# Patient Record
Sex: Female | Born: 1990 | Hispanic: No | Marital: Married | Smoking: Never smoker
Health system: Southern US, Community
[De-identification: ages and names within clinical notes are randomized; demographics above are authoritative.]

## PROBLEM LIST (undated history)

## (undated) ENCOUNTER — Inpatient Hospital Stay (HOSPITAL_COMMUNITY): Payer: Self-pay

## (undated) DIAGNOSIS — O99119 Other diseases of the blood and blood-forming organs and certain disorders involving the immune mechanism complicating pregnancy, unspecified trimester: Secondary | ICD-10-CM

## (undated) DIAGNOSIS — N920 Excessive and frequent menstruation with regular cycle: Secondary | ICD-10-CM

## (undated) DIAGNOSIS — B029 Zoster without complications: Secondary | ICD-10-CM

## (undated) DIAGNOSIS — R031 Nonspecific low blood-pressure reading: Secondary | ICD-10-CM

## (undated) DIAGNOSIS — B20 Human immunodeficiency virus [HIV] disease: Secondary | ICD-10-CM

## (undated) DIAGNOSIS — D696 Thrombocytopenia, unspecified: Secondary | ICD-10-CM

## (undated) HISTORY — DX: Zoster without complications: B02.9

## (undated) HISTORY — DX: Thrombocytopenia, unspecified: D69.6

## (undated) HISTORY — DX: Human immunodeficiency virus (HIV) disease: B20

## (undated) HISTORY — DX: Excessive and frequent menstruation with regular cycle: N92.0

## (undated) HISTORY — DX: Nonspecific low blood-pressure reading: R03.1

## (undated) HISTORY — DX: Thrombocytopenia, unspecified: O99.119

## (undated) HISTORY — PX: NO PAST SURGERIES: SHX2092

---

## 2015-09-30 DIAGNOSIS — B20 Human immunodeficiency virus [HIV] disease: Secondary | ICD-10-CM | POA: Insufficient documentation

## 2015-09-30 DIAGNOSIS — O0990 Supervision of high risk pregnancy, unspecified, unspecified trimester: Secondary | ICD-10-CM | POA: Insufficient documentation

## 2015-09-30 HISTORY — DX: Human immunodeficiency virus (HIV) disease: B20

## 2015-10-25 DIAGNOSIS — R760 Raised antibody titer: Secondary | ICD-10-CM | POA: Insufficient documentation

## 2015-10-25 HISTORY — DX: Raised antibody titer: R76.0

## 2015-12-14 LAB — OB RESULTS CONSOLE PLATELET COUNT: Platelets: 125 10*3/uL

## 2015-12-14 LAB — OB RESULTS CONSOLE RUBELLA ANTIBODY, IGM: Rubella: IMMUNE

## 2015-12-14 LAB — OB RESULTS CONSOLE GC/CHLAMYDIA
CHLAMYDIA, DNA PROBE: NEGATIVE
GC PROBE AMP, GENITAL: NEGATIVE

## 2015-12-14 LAB — OB RESULTS CONSOLE HGB/HCT, BLOOD
HEMATOCRIT: 35 %
HEMOGLOBIN: 12.3 g/dL

## 2015-12-14 LAB — OB RESULTS CONSOLE RPR: RPR: NONREACTIVE

## 2015-12-14 LAB — OB RESULTS CONSOLE HEPATITIS B SURFACE ANTIGEN: Hepatitis B Surface Ag: NEGATIVE

## 2015-12-14 LAB — OB RESULTS CONSOLE ABO/RH: RH TYPE: POSITIVE

## 2015-12-14 LAB — OB RESULTS CONSOLE ANTIBODY SCREEN: Antibody Screen: NEGATIVE

## 2015-12-14 LAB — OB RESULTS CONSOLE HIV ANTIBODY (ROUTINE TESTING): HIV: REACTIVE

## 2015-12-14 LAB — OB RESULTS CONSOLE VARICELLA ZOSTER ANTIBODY, IGG: Varicella: IMMUNE

## 2015-12-16 DIAGNOSIS — Z77011 Contact with and (suspected) exposure to lead: Secondary | ICD-10-CM | POA: Insufficient documentation

## 2016-01-02 DIAGNOSIS — R031 Nonspecific low blood-pressure reading: Secondary | ICD-10-CM

## 2016-01-02 HISTORY — DX: Nonspecific low blood-pressure reading: R03.1

## 2016-02-06 ENCOUNTER — Encounter (HOSPITAL_COMMUNITY): Payer: Self-pay | Admitting: Physician Assistant

## 2016-02-13 ENCOUNTER — Ambulatory Visit: Payer: Self-pay | Admitting: Internal Medicine

## 2016-03-21 LAB — RPR
HEPATITIS B SURFACE ANTIGEN: NEGATIVE
Hepatitis C Ab: NEGATIVE
RPR: NEGATIVE

## 2016-03-21 LAB — GLUCOSE, 1 HOUR: Glucose 1 Hr Prenatal, POC: 108 mg/dL

## 2016-03-22 ENCOUNTER — Telehealth: Payer: Self-pay | Admitting: *Deleted

## 2016-03-22 ENCOUNTER — Ambulatory Visit (INDEPENDENT_AMBULATORY_CARE_PROVIDER_SITE_OTHER): Payer: Medicaid Other | Admitting: Internal Medicine

## 2016-03-22 ENCOUNTER — Ambulatory Visit: Payer: Self-pay | Admitting: Internal Medicine

## 2016-03-22 ENCOUNTER — Encounter: Payer: Self-pay | Admitting: Internal Medicine

## 2016-03-22 VITALS — BP 114/76 | HR 94 | Temp 98.1°F | Ht 63.0 in | Wt 141.0 lb

## 2016-03-22 DIAGNOSIS — B20 Human immunodeficiency virus [HIV] disease: Secondary | ICD-10-CM | POA: Diagnosis present

## 2016-03-22 DIAGNOSIS — Z23 Encounter for immunization: Secondary | ICD-10-CM | POA: Diagnosis not present

## 2016-03-22 DIAGNOSIS — R031 Nonspecific low blood-pressure reading: Secondary | ICD-10-CM | POA: Diagnosis not present

## 2016-03-22 LAB — COMPLETE METABOLIC PANEL WITH GFR
ALBUMIN: 3.1 g/dL — AB (ref 3.6–5.1)
ALK PHOS: 51 U/L (ref 33–115)
ALT: 14 U/L (ref 6–29)
AST: 19 U/L (ref 10–30)
BILIRUBIN TOTAL: 1 mg/dL (ref 0.2–1.2)
BUN: 6 mg/dL — ABNORMAL LOW (ref 7–25)
CO2: 23 mmol/L (ref 20–31)
CREATININE: 0.38 mg/dL — AB (ref 0.50–1.10)
Calcium: 8.6 mg/dL (ref 8.6–10.2)
Chloride: 105 mmol/L (ref 98–110)
GFR, Est Non African American: >89 mL/min (ref >=60)
GLUCOSE: 89 mg/dL (ref 65–99)
Potassium: 3.9 mmol/L (ref 3.5–5.3)
Sodium: 139 mmol/L (ref 135–146)
TOTAL PROTEIN: 6.2 g/dL (ref 6.1–8.1)

## 2016-03-22 LAB — CBC WITH DIFFERENTIAL/PLATELET
BASOS ABS: 0 {cells}/uL (ref 0–200)
BASOS PCT: 0 %
EOS ABS: 81 {cells}/uL (ref 15–500)
Eosinophils Relative: 1 %
HEMATOCRIT: 35.4 % (ref 35.0–45.0)
HEMOGLOBIN: 12.2 g/dL (ref 11.7–15.5)
Lymphocytes Relative: 13 %
Lymphs Abs: 1053 cells/uL (ref 850–3900)
MCH: 31.6 pg (ref 27.0–33.0)
MCHC: 34.5 g/dL (ref 32.0–36.0)
MCV: 91.7 fL (ref 80.0–100.0)
MONO ABS: 486 {cells}/uL (ref 200–950)
MPV: 10.9 fL (ref 7.5–12.5)
Monocytes Relative: 6 %
NEUTROS ABS: 6480 {cells}/uL (ref 1500–7800)
Neutrophils Relative %: 80 %
PLATELETS: 114 10*3/uL — AB (ref 140–400)
RBC: 3.86 MIL/uL (ref 3.80–5.10)
RDW: 13.7 % (ref 11.0–15.0)
WBC: 8.1 10*3/uL (ref 3.8–10.8)

## 2016-03-22 MED ORDER — EMTRICITABINE-TENOFOVIR DF 200-300 MG PO TABS: 1.0000 | ORAL_TABLET | Freq: Every day | ORAL | 5 refills | Status: DC

## 2016-03-22 MED ORDER — DOLUTEGRAVIR SODIUM 50 MG PO TABS: 50.0000 mg | ORAL_TABLET | Freq: Every day | ORAL | 5 refills | Status: DC

## 2016-03-22 MED ORDER — ATAZANAVIR SULFATE 200 MG PO CAPS: 400.0000 mg | ORAL_CAPSULE | Freq: Every day | ORAL | 5 refills | Status: DC

## 2016-03-22 MED ORDER — RITONAVIR 100 MG PO TABS: 100.0000 mg | ORAL_TABLET | Freq: Every day | ORAL | 5 refills | Status: DC

## 2016-03-22 NOTE — Progress Notes (Signed)
RFV: hiv care and initial visit into the clinic  Patient ID: Mary Zhang, female   DOB: [birth year], 25 y.o.   MRN: [id number]  HPI Mary Zhang is a 25 yo F who immigrated to the [place] this past year from [place] . In feb 2017 as part of her immigration physical exam, she was diagnosed with HIV disease, her nadir CD 4 count of 260/VL 37,9000, genotype non significant mutations. ART naive at that time. RF for hiv, having sex with man who is MSM. Reportedly had only 2 sex partners. She did get married with eth[place]ian female who has resided in the [place] already who is HIV negative. She is now pregnant, at gestational stage of  [redacted]w[redacted]d. She was initially seen at [facility] for which she was started on truvada-boosted atazanavir, which she has been taking for the past 4-5 months. Serial labs reveal that she is not undetectable as of yet. Labs on 7/19 show cd 4 count of 290/ VL 226. She reports taking her meds daily as directed and is able to point to medications she is taking, currently only at the 300mg  dose of atazanavir. She is here with her husband and interpreter. At [facility] app[facility], they too were concern that she was not virologically suppressed, thus has genosure  Other labs reveal: Hep a immune, hep b immune, hep c neg.toxo +,  Genotype 09/2015:  RT GENE MUTATIONS: R211K PR GENE MUTATIONS: W09W,J19J,Y78G,N56O,Z30Q,M57Q,I69G,E95M  No Known Allergies    Outpatient Encounter Prescriptions as of 03/22/2016  Medication Sig  . atazanavir (REYATAZ) 300 MG capsule Take 300 mg by mouth.  Marland Kitchen emtricitabine-tenofovir (TRUVADA) 200-300 MG tablet Take 1 tablet by mouth daily.  . IRON PO Take 1 tablet by mouth daily.  . Prenat-FeFum-FePo-FA-Omega 3 (CONCEPT DHA) 53.5-38-1 MG CAPS Take 1 tablet by mouth daily.  . ritonavir (NORVIR) 100 MG TABS tablet Take 100 mg by mouth daily.  . DOCOSAHEXAENOIC ACID PO Take 1 tablet by mouth daily.   No facility-administered encounter medications on file as of  03/22/2016.      There are no active problems to display for this patient.  Past Medical History:   . 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 History of toxoplasmosis  positive Toxo IgG  . HIV infection (HCC) 09/01/2015  followed by ID  . Lead exposure: 7H 12/16/2015  12/16/15 - [facility]  . Shingles 08/2015  - hx of trichomonas and BV in spring 2017  Family History  Problem Relation Age of Onset  . Breast cancer Neg Hx  . Cancer Neg Hx  . Colon cancer Neg Hx  . Diabetes Neg Hx  . Eclampsia Neg Hx  . Hypertension Neg Hx  . Miscarriages / Stillbirths Neg Hx  . Ovarian cancer Neg Hx  . Preterm labor Neg Hx  . Stroke Neg Hx   Social History  Category History  Smoking Tobacco Use Never Smoker  Smokeless Tobacco Use Never Used  Tobacco Comment  Alcohol Use No  Drug Use No    Health Maintenance Due  Topic Date Due  . HIV Screening  09/23/2007  . TETANUS/TDAP  09/23/2011  . PAP SMEAR  09/22/2013  . INFLUENZA VACCINE  02/21/2016     Review of Systems  Constitutional: Negative for fever, chills, diaphoresis, activity change, appetite change, fatigue and unexpected weight change.  HENT: Negative for congestion, sore throat, rhinorrhea, sneezing, trouble swallowing and sinus pressure.  Eyes: Negative for photophobia and  visual disturbance.  Respiratory: Negative for cough, chest tightness, shortness of breath, wheezing and stridor.  Cardiovascular: Negative for chest pain, palpitations and leg swelling.  Gastrointestinal: Negative for nausea, vomiting, abdominal pain, diarrhea, constipation, blood in stool, abdominal distention and anal bleeding.  Genitourinary: Negative for dysuria, hematuria, flank pain and difficulty urinating.  Musculoskeletal: Negative for myalgias, back pain, joint swelling, arthralgias and gait problem.  Skin: Negative for color change, pallor, rash and wound.  Neurological: Negative for dizziness, tremors, weakness  and light-headedness.  Hematological: Negative for adenopathy. Does not bruise/bleed easily.  Psychiatric/Behavioral: Negative for behavioral problems, confusion, sleep disturbance, dysphoric mood, decreased concentration and agitation.    Physical Exam   BP 114/76   Pulse 94   Temp 98.1 F (36.7 C) (Oral)   Wt 141 lb (64 kg)   LMP 09/10/2015   Physical Exam  Constitutional:  oriented to person, place, and time. appears well-developed and well-nourished. No distress.  HENT: [place]/AT, PERRLA, no scleral icterus Mouth/Throat: Oropharynx is clear and moist. No oropharyngeal exudate.  Cardiovascular: Normal rate, regular rhythm and normal heart sounds. Exam reveals no gallop and no friction rub.  No murmur heard.  Pulmonary/Chest: Effort normal and breath sounds normal. No respiratory distress.  has no wheezes.  Neck = supple, no nuchal rigidity Abdominal: Soft. Bowel sounds are normal.  exhibits no distension. There is no tenderness.  Lymphadenopathy: no cervical adenopathy. No axillary adenopathy Neurological: alert and oriented to person, place, and time.  Skin: Skin is warm and dry. No rash noted. No erythema.  Psychiatric: a normal mood and affect.  behavior is normal.     Assessment and Plan  hiv disease= she technically should be undetectable if she has been taking her regimen for the past 5 months. [facility]. She has genosure pending. Will need to increase to atazanavir 400mg  daily plus ritonavir 100mg , truvada daily since she is in her 2nd trimester. We will also add integrase inhibitor. Attempting to reach [facility] to get genosure results  Needs to reapply for medicaid plus adap  Pregnancy = [facility] since they would like to be closer to [place]. Prenatal vitamin Iron  Will need flu vaccine, she is rubella immune. Will need to look at if she received tdap, and pneumococcal.

## 2016-03-22 NOTE — Telephone Encounter (Signed)
Patient scheduled with [facility] for high risk pregnancy. Appointment 9/14, 7:40.  Pt, husband, Nurse, learning disabilitytranslator, and case Production designer, theatre/television/filmmanager given appointment information.  [facility]  Able to access records via Care Everywhere as well. Andree CossHowell, Michelle M, RN

## 2016-03-23 LAB — T-HELPER CELL (CD4) - (RCID CLINIC ONLY)
CD4 % Helper T Cell: 25 % — ABNORMAL LOW (ref 33–55)
CD4 T Cell Abs: 260 /uL — ABNORMAL LOW (ref 400–2700)

## 2016-03-23 LAB — HIV-1 RNA QUANT-NO REFLEX-BLD
HIV 1 RNA Quant: 78 copies/mL — ABNORMAL HIGH (ref <20)
HIV-1 RNA QUANT, LOG: 1.89 {Log_copies}/mL — AB (ref <1.30)

## 2016-03-27 ENCOUNTER — Encounter: Payer: Self-pay | Admitting: Licensed Clinical Social Worker

## 2016-04-02 ENCOUNTER — Telehealth: Payer: Self-pay | Admitting: Pharmacist Clinician (PhC)/ Clinical Pharmacy Specialist

## 2016-04-02 NOTE — Telephone Encounter (Signed)
Genosure Archive under [employer] tab

## 2016-04-04 ENCOUNTER — Encounter: Payer: Self-pay | Admitting: Internal Medicine

## 2016-04-05 ENCOUNTER — Encounter: Payer: Self-pay | Admitting: Obstetrics & Gynecology

## 2016-04-05 ENCOUNTER — Other Ambulatory Visit (HOSPITAL_COMMUNITY): Payer: Self-pay

## 2016-04-05 ENCOUNTER — Ambulatory Visit (INDEPENDENT_AMBULATORY_CARE_PROVIDER_SITE_OTHER): Payer: Medicaid Other | Admitting: Obstetrics & Gynecology

## 2016-04-05 VITALS — BP 101/70 | Ht 62.0 in | Wt 141.0 lb

## 2016-04-05 DIAGNOSIS — O98713 Human immunodeficiency virus [HIV] disease complicating pregnancy, third trimester: Secondary | ICD-10-CM

## 2016-04-05 DIAGNOSIS — Z21 Asymptomatic human immunodeficiency virus [HIV] infection status: Secondary | ICD-10-CM

## 2016-04-05 DIAGNOSIS — B20 Human immunodeficiency virus [HIV] disease: Secondary | ICD-10-CM

## 2016-04-05 DIAGNOSIS — O0990 Supervision of high risk pregnancy, unspecified, unspecified trimester: Secondary | ICD-10-CM | POA: Insufficient documentation

## 2016-04-05 LAB — POCT URINALYSIS DIP (DEVICE)
Bilirubin Urine: NEGATIVE
GLUCOSE, UA: NEGATIVE mg/dL
Hgb urine dipstick: NEGATIVE
KETONES UR: NEGATIVE mg/dL
Nitrite: NEGATIVE
PROTEIN: NEGATIVE mg/dL
SPECIFIC GRAVITY, URINE: 1.02 (ref 1.005–1.030)
UROBILINOGEN UA: 0.2 mg/dL (ref 0.0–1.0)
pH: 6 (ref 5.0–8.0)

## 2016-04-05 NOTE — Progress Notes (Signed)
[facility] Dorthula NettlesProkai, David, MD - 12/28/2015 2:40 PM EDT Formatting of this note may be different from the original. NEW OB VISIT  [employer] Amharic Interpretor used for today's visit.  HPI: Ms. Mary Zhang is a 25 y.o. [place] female, G1P0 at 5235w4d weeks by LMP who presents for return prenatal visit. Her primary language is Medical sales representativeAmharic.   She has had no contractions, vaginal bleeding or loss of fluid. She endorses active fetal movement. Pt's spouse is present for today's visit. Pt has a h/o Toxoplasmosis, Trichomonas and Shingles per medical records.  Pt was diagnosed with HIV on 09/01/15 as a part of Refugee exam testing. Her spouse is reportedly negative. She was last seen by ID on 11/30/15 with CD4 of 0.25(L) and Viral Load of 591. She is compliant with Truvada, Norvir and Reyataz without missed doses. Additional lab results are as follows:  Hep C: negative  TB: negative 09/30/15  Toxo IgG: 1.70 ^  Trichomonas: 08/2015 - treated  Shingles: 08/2015- treated  She has scheduled follow-up with ID which she attends faithfully. She reports close compliance with her HIV medications.   REVIEW OF SYSTEMS: Negative except as stated in HPI.  OBSTETRICAL HISTORY:  OB History  Gravida Para Term Preterm AB SAB TAB Ectopic Multiple Living  1   # Outcome Date GA Lbr Len/2nd Weight Sex Delivery Anes PTL Lv  1 Current     CESAREAN HISTORY: N/A  GYNECOLOGIC HISTORY: Menses: 25 years old at menarche STI: trichomonas 08/2015. Pt denies history of HSV.  Last pap: Negative cytology/ negative HPV History of dysplasia: No.  PAST MEDICAL HISTORY:  Past Medical History:  Diagnosis Date  . 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 History of toxoplasmosis  positive Toxo IgG  . HIV infection (HCC) 09/01/2015  followed by ID  . Lead exposure: 7H 12/16/2015  12/16/15 - [facility]  . Shingles 08/2015    PAST SURGICAL HISTORY: No past surgical history  on file.   FAMILY HISTORY:  Family History  Problem Relation Age of Onset  . Breast cancer Neg Hx  . Cancer Neg Hx  . Colon cancer Neg Hx  . Diabetes Neg Hx  . Eclampsia Neg Hx  . Hypertension Neg Hx  . Miscarriages / Stillbirths Neg Hx  . Ovarian cancer Neg Hx  . Preterm labor Neg Hx  . Stroke Neg Hx    SOCIAL HISTORY:  Social History   Social History  . Marital status: Married  Spouse name: N/A  . Number of children: N/A  . Years of education: N/A   Occupational History  . Not on file.   Social History Main Topics  . Smoking status: Never Smoker  . Smokeless tobacco: Never Used  . Alcohol use No  . Drug use: No  . Sexual activity: Yes  Partners: Female  Birth control/ protection: None   Other Topics Concern  . Not on file   Social History Narrative    MEDICATIONS:  Current Outpatient Prescriptions:  . atazanavir (REYATAZ) 300 MG capsule, Take 1 capsule (300 mg total) by mouth daily with breakfast., Disp: 30 capsule, Rfl: 3 . emtricitabine-tenofovir, TDF, (TRUVADA) 200-300 mg per tablet, Take 1 tablet by mouth daily., Disp: 30 tablet, Rfl: 3 . ritonavir (NORVIR) 100 mg tablet, Take 1 tablet (100 mg total) by mouth daily with breakfast., Disp: 30 tablet, Rfl: 3 . PNV 16-iron fum,ps-folic-omeg3 (TARON-C DHA, CONCEPT DHA, DOTHELLE DHA, VIRT-C DHA) 35-1-200 mg  Cap, Take 1 capsule by mouth daily., Disp: 90 capsule, Rfl: 3 . PNV with Ca,no.74-iron-FA 27-1 mg Tab, Take 1 tablet by mouth daily., Disp: 30 tablet, Rfl: 11   ALLERGIES: Review of patient's allergies indicates no known allergies.   PHYSICAL EXAM  BP 93/57  Pulse 89  Temp 98.3 F (36.8 C)  Wt 124 lb 3.2 oz (56.3 kg)  LMP 09/10/2015  BMI 22 kg/m2  Body mass index is 22 kg/(m^2).  General appearance: Well nourished, well hydrated, no acute distress,  Gastrointestinal:   Abdomen: Soft, non distended, non-tender, no masses Extremities: No significant deformity or joint abnormality. No edema,  clubbing, or cyanosis.   PRENATAL LABS:  Initial: MBT A+, AB neg, HIV REACTIVE, RPRNR, HepB neg, Rubella imm, Varicella N/A, GC/CHL -/-, PAP WNL, Hgb 12.3, Plts 125, Hb electrophoresis WNL 28 week: Not yet done 36 week: Not yet done  ULTRASOUND:   Initial scan ordered with first trimester screening --> scheduled for 01/05/16  ASSESSMENT/PLAN:   1. IUP at [redacted]w[redacted]d weeks  No complaints  Prenatal labs ordered  Pap: completed  GC/CT: completed  Vaccines: Flu declined. Tdap- offer at 28wks  ACOG recommendations for weight gain and exercise reviewed.  Centering: Declined  Factors affecting current pregnancy:  HIV Positive: last seen by ID on 11/30/15 with CD4 count: 0.25L and Viral Load: 591. s/p MFM consultation  Dating scan with first trimester screen  Interval growth q 4-6 weeks  ID Specialist Evaluation monthly: LFTs, CD4 and viral load. Visits scheduled  Anticipate NSVD if VL < 1000  2. Fetal Wellbeing  S=D  FCA Present  [place] ordered   QS: declined  3. Postpartum  Infant feeding: Bottle  Contraception: unsure  Dispo: RTC x 3 week; Has ID appt 01/02/16 Routine precautions reviewed.  Dorthula Nettles, MD  Electronically signed by: Dorthula Nettles, MD Resident 01/04/16 [phone]

## 2016-04-05 NOTE — Progress Notes (Signed)
Here for initial prenatal visit. Used [employer] interpreter [phone]. Given new patient information. Large leukocytes noted on urinalysis.

## 2016-04-05 NOTE — Progress Notes (Signed)
  HIV - O98.719 20-28-36 PRN    Subjective:transfer from [facility], followed by ID for HIV    Mary Zhang is a G1P0 6747w5d being seen today for her first obstetrical visit.  Her obstetrical history is significant for HIV disease. Patient does not intend to breast feed. Pregnancy history fully reviewed.  Patient reports no complaints.  Vitals:   04/05/16 0843 04/05/16 0851  BP: 101/70   Weight: 141 lb (64 kg)   Height:  5\' 2"  (1.575 m)    HISTORY: OB History  Gravida Para Term Preterm AB Living  1            SAB TAB Ectopic Multiple Live Births               # Outcome Date GA Lbr Len/2nd Weight Sex Delivery Anes PTL Lv  1 Current              Past Medical History:  Diagnosis Date  . HIV disease (HCC) 09/30/2015  . Low blood pressure, not hypotension 01/02/2016  . Shingles    History reviewed. No pertinent surgical history. History reviewed. No pertinent family history.   Exam    Uterus:  Fundal Height: 32 cm  Pelvic Exam:                                    Skin: normal coloration and turgor, no rashes    Neurologic: oriented, normal mood   Extremities: normal strength, tone, and muscle mass   HEENT extra ocular movement intact and sclera clear, anicteric   Mouth/Teeth dental hygiene good   Neck supple   Cardiovascular: regular rate and rhythm   Respiratory:  appears well, vitals normal, no respiratory distress, acyanotic, normal RR   Abdomen: gravid 32 cm FH   Urinary:        Assessment:    Pregnancy: G1P0 Patient Active Problem List   Diagnosis Date Noted  . Supervision of high-risk pregnancy 04/05/2016  . Low blood pressure, not hypotension 01/02/2016  . Lead exposure 12/16/2015  . Raised antibody titer 10/25/2015  . HIV disease (HCC) 09/30/2015        Plan:     Initial labs reviewed Prenatal vitamins. Problem list reviewed and updated. [facility]   Follow up in 2 weeks. 50% of 30 min visit spent on counseling and  coordination of care.  US scheduled   ARNOLD,JAMES 04/05/2016

## 2016-04-06 LAB — PAIN MGMT, PROFILE 6 CONF W/O MM, U
6 ACETYLMORPHINE: NEGATIVE ng/mL (ref <10)
ALCOHOL METABOLITES: NEGATIVE ng/mL (ref <500)
AMPHETAMINES: NEGATIVE ng/mL (ref <500)
Barbiturates: NEGATIVE ng/mL (ref <300)
Benzodiazepines: NEGATIVE ng/mL (ref <100)
COCAINE METABOLITE: NEGATIVE ng/mL (ref <150)
Creatinine: 60.7 mg/dL (ref >=20.0)
Marijuana Metabolite: NEGATIVE ng/mL (ref <20)
Methadone Metabolite: NEGATIVE ng/mL (ref <100)
OXYCODONE: NEGATIVE ng/mL (ref <100)
Opiates: NEGATIVE ng/mL (ref <100)
Oxidant: NEGATIVE ug/mL (ref <200)
PH: 6.84 (ref 4.5–9.0)
PHENCYCLIDINE: NEGATIVE ng/mL (ref <25)
PLEASE NOTE: 0

## 2016-04-10 ENCOUNTER — Other Ambulatory Visit: Payer: Self-pay | Admitting: Obstetrics & Gynecology

## 2016-04-10 ENCOUNTER — Encounter (HOSPITAL_COMMUNITY): Payer: Self-pay

## 2016-04-10 ENCOUNTER — Ambulatory Visit (HOSPITAL_COMMUNITY)
Admission: RE | Admit: 2016-04-10 | Discharge: 2016-04-10 | Disposition: A | Discharge status: Home / Self Care | Payer: Medicaid Other | Source: Ambulatory Visit | Attending: Obstetrics & Gynecology | Admitting: Obstetrics & Gynecology

## 2016-04-10 DIAGNOSIS — Z36 Encounter for antenatal screening of mother: Secondary | ICD-10-CM | POA: Insufficient documentation

## 2016-04-10 DIAGNOSIS — O98713 Human immunodeficiency virus [HIV] disease complicating pregnancy, third trimester: Secondary | ICD-10-CM | POA: Insufficient documentation

## 2016-04-10 DIAGNOSIS — O0990 Supervision of high risk pregnancy, unspecified, unspecified trimester: Secondary | ICD-10-CM

## 2016-04-10 DIAGNOSIS — Z3A30 30 weeks gestation of pregnancy: Secondary | ICD-10-CM | POA: Diagnosis not present

## 2016-04-10 DIAGNOSIS — B20 Human immunodeficiency virus [HIV] disease: Secondary | ICD-10-CM

## 2016-04-12 ENCOUNTER — Ambulatory Visit (INDEPENDENT_AMBULATORY_CARE_PROVIDER_SITE_OTHER): Payer: Medicaid Other | Admitting: Internal Medicine

## 2016-04-12 VITALS — BP 107/73 | HR 94 | Temp 98.3°F | Wt 153.5 lb

## 2016-04-12 DIAGNOSIS — Z331 Pregnant state, incidental: Secondary | ICD-10-CM

## 2016-04-12 DIAGNOSIS — B20 Human immunodeficiency virus [HIV] disease: Secondary | ICD-10-CM

## 2016-04-12 DIAGNOSIS — Z3493 Encounter for supervision of normal pregnancy, unspecified, third trimester: Secondary | ICD-10-CM

## 2016-04-12 NOTE — Patient Instructions (Signed)
Come back in 2 wk for lab work  Come back in 4 wks for visit with dr Sundiata Ferrick

## 2016-04-12 NOTE — Progress Notes (Signed)
Rfv: follow up on hiv disease  Patient ID: Mary LoweKibr Crady, female   DOB: 05/25/1991, 25 y.o.   MRN: [id number]  HPI Jaloni is a 25yo F with recent diagnosis of HIV disease in feb 2017, also G1P0, currently 30wk4d from LMP. We first saw her on 8/31, with cd 4 count of 260/VL 78 in August. It looks like in her records that she never did achieve full viral suppression despite being on tx> 6 months.   She is able to correctly identify on the drug chart which medications she is taking and the qty of pills. She has been on this new regimen for roughly 3 weeks.  Outpatient Encounter Prescriptions as of 04/12/2016  Medication Sig  . atazanavir (REYATAZ) 200 MG capsule Take 2 capsules (400 mg total) by mouth daily with breakfast.  . DOCOSAHEXAENOIC ACID PO Take 1 tablet by mouth daily.  . dolutegravir (TIVICAY) 50 MG tablet Take 1 tablet (50 mg total) by mouth daily.  Marland Kitchen. emtricitabine-tenofovir (TRUVADA) 200-300 MG tablet Take 1 tablet by mouth daily.  . IRON PO Take 1 tablet by mouth daily.  . Prenat-FeFum-FePo-FA-Omega 3 (CONCEPT DHA) 53.5-38-1 MG CAPS Take 1 tablet by mouth daily.  . ritonavir (NORVIR) 100 MG TABS tablet Take 1 tablet (100 mg total) by mouth daily.   No facility-administered encounter medications on file as of 04/12/2016.      Patient Active Problem List   Diagnosis Date Noted  . Supervision of high-risk pregnancy 04/05/2016  . Low blood pressure, not hypotension 01/02/2016  . Lead exposure 12/16/2015  . Raised antibody titer 10/25/2015  . HIV disease (HCC) 09/30/2015     Health Maintenance Due  Topic Date Due  . TETANUS/TDAP  09/23/2011     Review of Systems 10 point ros is negative Physical Exam   BP 107/73   Pulse 94   Temp 98.3 F (36.8 C) (Oral)   Wt 69.6 kg (153 lb 8 oz)   LMP 09/10/2015 (Within Days)   BMI 28.08 kg/m  Physical Exam  Constitutional:  oriented to person, place, and time. appears well-developed and well-nourished. No distress.  HENT:  [place]/AT, PERRLA, no scleral icterus Mouth/Throat: Oropharynx is clear and moist. No oropharyngeal exudate.  Cardiovascular: Normal rate, regular rhythm and normal heart sounds. Exam reveals no gallop and no friction rub.  No murmur heard.  Pulmonary/Chest: Effort normal and breath sounds normal. No respiratory distress.  has no wheezes.  Neck = supple, no nuchal rigidity Abdominal: gravid. Protuberant abdomen of pregnancy Lymphadenopathy: no cervical adenopathy. No axillary adenopathy Neurological: alert and oriented to person, place, and time.  Skin: Skin is warm and dry. No rash noted. No erythema.  Psychiatric: a normal mood and affect.  behavior is normal.    Lab Results  Component Value Date   CD4TCELL 25 (L) 03/22/2016   Lab Results  Component Value Date   CD4TABS 260 (L) 03/22/2016   Lab Results  Component Value Date   HIV1RNAQUANT 78 (H) 03/22/2016   No results found for: HEPBSAB No results found for: RPR  CBC Lab Results  Component Value Date   WBC 8.1 03/22/2016   RBC 3.86 03/22/2016   HGB 12.2 03/22/2016   HCT 35.4 03/22/2016   PLT 114 (L) 03/22/2016   MCV 91.7 03/22/2016   MCH 31.6 03/22/2016   MCHC 34.5 03/22/2016   RDW 13.7 03/22/2016   LYMPHSABS 1,053 03/22/2016   MONOABS 486 03/22/2016   EOSABS 81 03/22/2016   BASOSABS 0 03/22/2016  BMET Lab Results  Component Value Date   NA 139 03/22/2016   K 3.9 03/22/2016   CL 105 03/22/2016   CO2 23 03/22/2016   GLUCOSE 89 03/22/2016   BUN 6 (L) 03/22/2016   CREATININE 0.38 (L) 03/22/2016   CALCIUM 8.6 03/22/2016   GFRNONAA >89 03/22/2016   GFRAA >89 03/22/2016     Assessment and Plan  hiv disease = continue on current regimen. Too early to check viral load. Plan to have her come back to lab in 2-3 wks. We will see her back in 4 wk. Instructed her to call us if any difficulty with taking her medication or if new side effects. Discussed through translator phone that our goal is to have her undetectable  viral load before she delivers. She is due in roughly 8-10 wk.  Pregnancy = continue with prenatals and follow up with ob

## 2016-04-25 ENCOUNTER — Ambulatory Visit (INDEPENDENT_AMBULATORY_CARE_PROVIDER_SITE_OTHER): Payer: Medicaid Other | Admitting: Obstetrics and Gynecology

## 2016-04-25 VITALS — BP 97/58 | HR 80 | Wt 145.9 lb

## 2016-04-25 DIAGNOSIS — O98713 Human immunodeficiency virus [HIV] disease complicating pregnancy, third trimester: Secondary | ICD-10-CM

## 2016-04-25 DIAGNOSIS — Z21 Asymptomatic human immunodeficiency virus [HIV] infection status: Secondary | ICD-10-CM

## 2016-04-25 DIAGNOSIS — B20 Human immunodeficiency virus [HIV] disease: Secondary | ICD-10-CM

## 2016-04-25 DIAGNOSIS — Z23 Encounter for immunization: Secondary | ICD-10-CM | POA: Diagnosis not present

## 2016-04-25 DIAGNOSIS — O0990 Supervision of high risk pregnancy, unspecified, unspecified trimester: Secondary | ICD-10-CM

## 2016-04-25 NOTE — Progress Notes (Signed)
Used [employer] interpreter [phone].

## 2016-04-25 NOTE — Patient Instructions (Addendum)
Please call your Infectious disease doctor to find out when you should be getting your blood work and having your follow up appointment. Their number is [phone].   Td Vaccine (Tetanus and Diphtheria): What You Need to Know 1. Why get vaccinated? Tetanus  and diphtheria are very serious diseases. They are rare in the [place] today, but people who do become infected often have severe complications. Td vaccine is used to protect adolescents and adults from both of these diseases. Both tetanus and diphtheria are infections caused by bacteria. Diphtheria spreads from person to person through coughing or sneezing. Tetanus-causing bacteria enter the body through cuts, scratches, or wounds. TETANUS (Lockjaw) causes painful muscle tightening and stiffness, usually all over the body.  It can lead to tightening of muscles in the head and neck so you can't open your mouth, swallow, or sometimes even breathe. Tetanus kills about 1 out of every 10 people who are infected even after receiving the best medical care. DIPHTHERIA can cause a thick coating to form in the back of the throat.  It can lead to breathing problems, paralysis, heart failure, and death. Before vaccines, as many as 200,000 cases of diphtheria and hundreds of cases of tetanus were reported in the [place] each year. Since vaccination began, reports of cases for both diseases have dropped by about 99%. 2. Td vaccine Td vaccine can protect adolescents and adults from tetanus and diphtheria. Td is usually given as a booster dose every 10 years but it can also be given earlier after a severe and dirty wound or burn. Another vaccine, called Tdap, which protects against pertussis in addition to tetanus and diphtheria, is sometimes recommended instead of Td vaccine. Your doctor or the person giving you the vaccine can give you more information. Td may safely be given at the same time as other vaccines. 3. Some people should not get  this vaccine  A person who has ever had a life-threatening allergic reaction after a previous dose of any tetanus or diphtheria containing vaccine, OR has a severe allergy to any part of this vaccine, should not get Td vaccine. Tell the person giving the vaccine about any severe allergies.  Talk to your doctor if you:  have seizures or another nervous system problem,  had severe pain or swelling after any vaccine containing diphtheria or tetanus,  ever had a condition called Guillain Barre Syndrome (GBS),  aren't feeling well on the day the shot is scheduled. 4. Risks of a vaccine reaction With any medicine, including vaccines, there is a chance of side effects. These are usually mild and go away on their own. Serious reactions are also possible but are rare. Most people who get Td vaccine do not have any problems with it. Mild Problems  following Td vaccine: (Did not interfere with activities)  Pain where the shot was given (about 8 people in 10)  Redness or swelling where the shot was given (about 1 person in 4)  Mild fever (rare)  Headache (about 1 person in 4)  Tiredness (about 1 person in 4) Moderate Problems following Td vaccine: (Interfered with activities, but did not require medical attention)  Fever over 102F (rare) Severe Problems  following Td vaccine: (Unable to perform usual activities; required medical attention)  Swelling, severe pain, bleeding and/or redness in the arm where the shot was given (rare). Problems that could happen after any vaccine:  People sometimes faint after a medical procedure, including vaccination. Sitting or lying down for  about 15 minutes can help prevent fainting, and injuries caused by a fall. Tell your doctor if you feel dizzy, or have vision changes or ringing in the ears.  Some people get severe pain in the shoulder and have difficulty moving the arm where a shot was given. This happens very rarely.  Any medication can cause a  severe allergic reaction. Such reactions from a vaccine are very rare, estimated at fewer than 1 in a million doses, and would happen within a few minutes to a few hours after the vaccination. As with any medicine, there is a very remote chance of a vaccine causing a serious injury or death. The safety of vaccines is always being monitored. For more information, visit: http://floyd.org/www.cdc.gov/vaccinesafety/ 5. What if there is a serious reaction? What should I look for?  Look for anything that concerns you, such as signs of a severe allergic reaction, very high fever, or unusual behavior. Signs of a severe allergic reaction can include hives, swelling of the face and throat, difficulty breathing, a fast heartbeat, dizziness, and weakness. These would usually start a few minutes to a few hours after the vaccination. What should I do?  If you think it is a severe allergic reaction or other emergency that can't wait, call 9-1-1 or get the person to the nearest hospital. Otherwise, call your doctor.  Afterward, the reaction should be reported to the Vaccine Adverse Event Reporting System (VAERS). Your doctor might file this report, or you can do it yourself through the VAERS web site at www.vaers.LAgents.nohhs.gov, or by calling 1-314-537-0418. VAERS does not give medical advice. 6. The National Vaccine Injury Compensation Program The [employer] Vaccine Injury Compensation Program (VICP) is a federal program that was created to compensate people who may have been injured by certain vaccines. Persons who believe they may have been injured by a vaccine can learn about the program and about filing a claim by calling 1-907-012-2871 or visiting the VICP website at SpiritualWord.atwww.hrsa.gov/vaccinecompensation. There is a time limit to file a claim for compensation. 7. How can I learn more?  Ask your doctor. He or she can give you the vaccine package insert or suggest other sources of information.  Call your local or state health  department.  Contact the Centers for Disease Control and Prevention (CDC):  Call 380-474-54081-(306)372-6525 (1-800-CDC-INFO)  Visit CDC's website at PicCapture.uywww.cdc.gov/vaccines CDC Td Vaccine VIS (09/15/13)   This information is not intended to replace advice given to you by your health care provider. Make sure you discuss any questions you have with your health care provider.   Document Released: 05/06/2006 Document Revised: 07/30/2014 Document Reviewed: 10/21/2013 Elsevier Interactive Patient Education [employer]2016 Elsevier Inc.

## 2016-04-25 NOTE — Progress Notes (Signed)
   PRENATAL VISIT NOTE  Subjective:  Mary Zhang is a 25 y.o. G1P0 at 8110w4d being seen today for ongoing prenatal care.  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  Patient reports no complaints. Endorses regular fetal movement and denies any vaginal bleeding, leaking of fluid or contractions  She is following with infectious disease for diagnosis of HIV and is on therapy. No recent viral load. ID plans to check one in the hear future. Pt is unsure when her next appointment is.  The following portions of the patient's history were reviewed and updated as appropriate: allergies, current medications, past family history, past medical history, past social history, past surgical history and problem list. Problem list updated.  Objective:   Vitals:   04/25/16 0758  BP: (!) 97/58  Pulse: 80  Weight: 145 lb 14.4 oz (66.2 kg)    Fetal Status: Fetal Heart Rate (bpm): 140 Fundal Height: 33 cm       General:  Alert, oriented and cooperative. Patient is in no acute distress.  Skin: Skin is warm and dry. No rash noted.   Cardiovascular: Normal heart rate noted  Respiratory: Normal respiratory effort, no problems with respiration noted  Abdomen: Soft, gravid, appropriate for gestational age.       Pelvic:  Cervical exam deferred        Extremities: Normal range of motion.     Mental Status: Normal mood and affect. Normal behavior. Normal judgment and thought content.   Urinalysis:    not collected  Assessment and Plan:  Pregnancy: G1P0 at 5810w4d  1. Supervision of high-risk pregnancy, unspecified trimester Tdap today, no other concerns continue routine care  2. HIV disease (HCC) Pt following with ID. On therapy. Plan to check viral load per ID in near future. Should have appointment at end of  the month but she is unsure when it is. She was given number to call and check.   Preterm labor symptoms and general obstetric precautions including but not limited to vaginal bleeding, contractions, leaking of fluid and fetal movement were reviewed in detail with the patient. Please refer to After Visit Summary for other counseling recommendations.  Return in about 2 weeks (around 05/09/2016) for HROB.  Lorne SkeensNicholas Michael Elyzabeth Goatley, MD

## 2016-04-27 ENCOUNTER — Encounter: Payer: Self-pay | Admitting: *Deleted

## 2016-05-03 ENCOUNTER — Encounter: Payer: Medicaid Other | Admitting: Family Medicine

## 2016-05-07 ENCOUNTER — Other Ambulatory Visit: Payer: Medicaid Other

## 2016-05-07 DIAGNOSIS — B20 Human immunodeficiency virus [HIV] disease: Secondary | ICD-10-CM

## 2016-05-08 ENCOUNTER — Inpatient Hospital Stay (HOSPITAL_COMMUNITY)
Admission: AD | Admit: 2016-05-08 | Discharge: 2016-05-08 | Disposition: A | Discharge status: Home / Self Care | Payer: Medicaid Other | Source: Ambulatory Visit | Attending: Obstetrics & Gynecology | Admitting: Obstetrics & Gynecology

## 2016-05-08 ENCOUNTER — Encounter (HOSPITAL_COMMUNITY): Payer: Self-pay | Admitting: *Deleted

## 2016-05-08 DIAGNOSIS — O98713 Human immunodeficiency virus [HIV] disease complicating pregnancy, third trimester: Secondary | ICD-10-CM | POA: Diagnosis present

## 2016-05-08 DIAGNOSIS — I959 Hypotension, unspecified: Secondary | ICD-10-CM | POA: Insufficient documentation

## 2016-05-08 DIAGNOSIS — B20 Human immunodeficiency virus [HIV] disease: Secondary | ICD-10-CM | POA: Diagnosis not present

## 2016-05-08 DIAGNOSIS — Z21 Asymptomatic human immunodeficiency virus [HIV] infection status: Secondary | ICD-10-CM

## 2016-05-08 DIAGNOSIS — O2653 Maternal hypotension syndrome, third trimester: Secondary | ICD-10-CM | POA: Insufficient documentation

## 2016-05-08 DIAGNOSIS — O42913 Preterm premature rupture of membranes, unspecified as to length of time between rupture and onset of labor, third trimester: Secondary | ICD-10-CM | POA: Diagnosis present

## 2016-05-08 DIAGNOSIS — Z3A34 34 weeks gestation of pregnancy: Secondary | ICD-10-CM | POA: Diagnosis not present

## 2016-05-08 DIAGNOSIS — O42919 Preterm premature rupture of membranes, unspecified as to length of time between rupture and onset of labor, unspecified trimester: Secondary | ICD-10-CM

## 2016-05-08 LAB — HIV-1 RNA QUANT-NO REFLEX-BLD
HIV 1 RNA Quant: <20 copies/mL (ref <20)
HIV-1 RNA Quant, Log: <1.3 Log copies/mL (ref <1.30)

## 2016-05-08 LAB — POCT FERN TEST: POCT FERN TEST: POSITIVE

## 2016-05-08 LAB — T-HELPER CELL (CD4) - (RCID CLINIC ONLY)
CD4 T CELL ABS: 300 /uL — AB (ref 400–2700)
CD4 T CELL HELPER: 24 % — AB (ref 33–55)

## 2016-05-08 LAB — GROUP B STREP BY PCR: Group B strep by PCR: NEGATIVE

## 2016-05-08 MED ORDER — ZIDOVUDINE 10 MG/ML IV SOLN
2.0000 mg/kg | Freq: Once | INTRAVENOUS | Status: AC
  Administered 2016-05-08: 134 mg via INTRAVENOUS
  Filled 2016-05-08: qty 13.4

## 2016-05-08 MED ORDER — BETAMETHASONE SOD PHOS & ACET 6 (3-3) MG/ML IJ SUSP
12.0000 mg | Freq: Once | INTRAMUSCULAR | Status: AC
  Administered 2016-05-08: 12 mg via INTRAMUSCULAR
  Filled 2016-05-08: qty 2

## 2016-05-08 MED ORDER — LACTATED RINGERS IV SOLN
INTRAVENOUS | Status: DC
  Administered 2016-05-08: 20:00:00 via INTRAVENOUS

## 2016-05-08 MED ORDER — SODIUM CHLORIDE 0.9 % IV SOLN
2.0000 g | Freq: Once | INTRAVENOUS | Status: AC
  Administered 2016-05-08: 2 g via INTRAVENOUS
  Filled 2016-05-08: qty 2000

## 2016-05-08 MED ORDER — PENICILLIN G POTASSIUM 5000000 UNITS IJ SOLR
5.0000 10*6.[IU] | Freq: Once | INTRAMUSCULAR | Status: DC
  Filled 2016-05-08: qty 5

## 2016-05-08 MED ORDER — ZIDOVUDINE 10 MG/ML IV SOLN
1.0000 mg/kg/h | INTRAVENOUS | Status: DC
  Administered 2016-05-08: 1 mg/kg/h via INTRAVENOUS
  Filled 2016-05-08: qty 40

## 2016-05-08 NOTE — MAU Provider Note (Signed)
MAU PROVIDER NOTE   Chief Complaint:  possible rupture of membranes  First Provider Initiated Contact with Patient 05/08/16 1744     HPI: Mary Zhang is a 25 y.o. G1P0 at 4988w3d who presents to maternity admissions reporting continuous leaking clear fluid since 1000 this morning.  Associated signs and symptoms: Negative for fever, chills, contractions or vaginal bleeding. Good fetal movement.   Pregnancy Course: Complicated by HIV. Dx 08/2015. On ART. Last VL 78 on 03/22/16 (drawn 05/07/16, pending), CD4 300 05/07/16.   Past Medical History:  Diagnosis Date  . HIV disease (HCC) 09/30/2015  . Low blood pressure, not hypotension 01/02/2016  . Shingles    OB History  Gravida Para Term Preterm AB Living  1            SAB TAB Ectopic Multiple Live Births               # Outcome Date GA Lbr Len/2nd Weight Sex Delivery Anes PTL Lv  1 Current              Past Surgical History:  Procedure Laterality Date  . NO PAST SURGERIES     No family history on file. Social History  Substance Use Topics  . Smoking status: Never Smoker  . Smokeless tobacco: Never Used  . Alcohol use No   No Known Allergies Prescriptions Prior to Admission  Medication Sig Dispense Refill Last Dose  . atazanavir (REYATAZ) 200 MG capsule Take 2 capsules (400 mg total) by mouth daily with breakfast. 60 capsule 5 05/08/2016 at Unknown time  . dolutegravir (TIVICAY) 50 MG tablet Take 1 tablet (50 mg total) by mouth daily. 30 tablet 5 05/08/2016 at Unknown time  . emtricitabine-tenofovir (TRUVADA) 200-300 MG tablet Take 1 tablet by mouth daily. 30 tablet 5 05/08/2016 at Unknown time  . Prenat-FeFum-FePo-FA-Omega 3 (CONCEPT DHA) 53.5-38-1 MG CAPS Take 1 tablet by mouth daily.  3 05/07/2016 at Unknown time  . ritonavir (NORVIR) 100 MG TABS tablet Take 1 tablet (100 mg total) by mouth daily. 30 tablet 5 05/08/2016 at Unknown time    I have reviewed patient's Past Medical Hx, Surgical Hx, Family Hx, Social Hx,  medications and allergies.   ROS:  Review of Systems  Constitutional: Negative for chills and fever.  Gastrointestinal: Negative for abdominal pain.  Genitourinary: Negative for dysuria, hematuria, vaginal bleeding and vaginal discharge.       Pos for LOF  Musculoskeletal: Negative for back pain.    Physical Exam   Patient Vitals for the past 24 hrs:  BP Temp Temp src Pulse Resp Weight  05/08/16 2049 107/70 98.3 F (36.8 C) Oral 80 18 -  05/08/16 1743 101/64 98.5 F (36.9 C) Oral 82 18 -  05/08/16 1720 98/60 98.2 F (36.8 C) Oral 92 16 148 lb 4 oz (67.2 kg)  Constitutional: Well-developed, well-nourished female in no acute distress.  Cardiovascular: normal rate Respiratory: normal effort GI: Abd soft, non-tender, gravid appropriate for gestational age. Mild contractions palpated.  MS: Extremities nontender, no edema, normal ROM Neurologic: Alert and oriented x 4.  GU: Neg CVAT.  Pelvic: NEFG, grossly ruptured clear fluid, +ferning, no blood. No CMT.   Dilation: Closed Effacement (%): 70 Station: -2 Presentation: Vertex by BS US Exam by:: Ivonne AndrewV Smith CNM  FHT:  Baseline 140 , moderate variability, accelerations present, no decelerations Contractions: q 2-5 mins, mild   Labs: Results for orders placed or performed during the hospital encounter of 05/08/16 (from the past 24  hour(s))  POCT fern test     Status: None   Collection Time: 05/08/16  6:30 PM  Result Value Ref Range   POCT Fern Test Positive = ruptured amniotic membanes   Group B strep by PCR     Status: None   Collection Time: 05/08/16  6:49 PM  Result Value Ref Range   Group B strep by PCR NEGATIVE NEGATIVE    Imaging:  Bedside ultrasound: Vertex presentationm  MAU Course/MDM:  Meds ordered this encounter  Medications  . lactated ringers infusion  . zidovudine (RETROVIR) 134 mg in dextrose 5 % 100 mL IVPB  . zidovudine (RETROVIR) 400 mg in dextrose 5 % 100 mL (4 mg/mL) infusion  . ampicillin (OMNIPEN) 2 g  in sodium chloride 0.9 % 50 mL IVPB for Group B Strep Prophylaxis  . betamethasone acetate-betamethasone sodium phosphate (CELESTONE) injection 12 mg   Recommended that pt undergo IOL for PPROM >34 weeks. Pt and [name] resistant. Want to wait until term or discuss at next office visit at [facility] on 05/10/16. CNM explained that the risk of infection outweighs risk of prematurity at this gestational age. Explained that although baby will probably need to be in NICU for a week or two, the risk of infection is not worth prolonging the pregnancy.  Patient and especially [name] still resistant. CNM explained that if pt left w/out delivering it would be AMA. Will give pt and [name] privacy to discuss POC. Will ask Dr. Macon Large to come discuss recommendation for IOL.   Dr. Macon Large at bedside. Reiterated rational for recommendation. Pt and [name] agree to IOL. NICU is full. Will need transfer to outside facility w/ space available in NICU. Pt and [name] verbalize understanding and agree w/ transfer.   AZT infusion started. GBS PCR collected. Ampicillin was initially started for unknown GBS, but GBS PCR came back as negative. Betamethasone was given.  Neonatology consult ordered.   Assessment: 1. Preterm premature rupture of membranes (PPROM) with unknown onset of labor   2. HIV disease affecting pregnancy, third trimester   IUP at [redacted]w[redacted]d  Plan: Transfer to [facility]  [place], [place] 05/08/2016 7:39 PM   Attestation of Attending Supervision of Provider:  Evaluation and management procedures were performed by this provider under my supervision and collaboration.  I have reviewed the provider's note above and chart, and I agree with the management and plan.   I talked to Dr. Lorelee New, OB Attending on call at [facility], and explained the need to transfer patient given that our NICU is presently full.  Dr. Ludwig Clarks accepted the transfer of the patient.  MAU staff will help coordinate  transportation to [place].  Patient is stable, Category I FHR tracing currently. No current signs/symptoms of progressing preterm labor. She is stable for transfer. Patient and her husband also agree with this plan of care.     Jaynie Collins, MD, FACOG Attending Obstetrician & Gynecologist Faculty Practice, [facility]

## 2016-05-08 NOTE — MAU Note (Signed)
C/o ?SROM @ 1000 this AM;

## 2016-05-08 NOTE — MAU Note (Signed)
Water broke, around 1000 this morning.  Clear, pinkish gel, now just clear water.  No bleeding. No pain

## 2016-05-10 ENCOUNTER — Encounter: Payer: Medicaid Other | Admitting: Family

## 2016-05-16 ENCOUNTER — Encounter: Payer: Medicaid Other | Admitting: Family

## 2016-05-17 ENCOUNTER — Encounter: Payer: Self-pay | Admitting: Internal Medicine

## 2016-05-17 ENCOUNTER — Ambulatory Visit (INDEPENDENT_AMBULATORY_CARE_PROVIDER_SITE_OTHER): Payer: Medicaid Other | Admitting: Internal Medicine

## 2016-05-17 VITALS — BP 115/75 | HR 96 | Temp 97.6°F | Wt 137.0 lb

## 2016-05-17 DIAGNOSIS — Z21 Asymptomatic human immunodeficiency virus [HIV] infection status: Secondary | ICD-10-CM

## 2016-05-17 DIAGNOSIS — B20 Human immunodeficiency virus [HIV] disease: Secondary | ICD-10-CM | POA: Diagnosis not present

## 2016-05-17 MED ORDER — DOLUTEGRAVIR SODIUM 50 MG PO TABS: 50.0000 mg | ORAL_TABLET | Freq: Every day | ORAL | 11 refills | Status: DC

## 2016-05-17 MED ORDER — EMTRICITABINE-TENOFOVIR AF 200-25 MG PO TABS: 1.0000 | ORAL_TABLET | Freq: Every day | ORAL | 11 refills | Status: DC

## 2016-05-17 NOTE — Progress Notes (Signed)
RFV: hiv follow up  Patient ID: Mary Zhang, female   DOB: November 10, 1990, 25 y.o.   MRN: [id number]  HPI 25yo F with HIV disease, CD 4 count of 300/VL<20 (mid Oct) currently on tivicay/boosted atazanavir-truvada, G1P1, who had preterm premature rupture of membranes and pre-term labor at [redacted]w[redacted]d. [facility] her baby girl, who remains in the ICU. Mary Zhang is doing ok, but reports she is looking forward to having her baby girl home. She is still having some vaginal spotting. Using less than 1 pad per day. She states that she is not having difficulty with milk-letdown. Denies breast tenderness  Outpatient Encounter Prescriptions as of 05/17/2016  Medication Sig  . atazanavir (REYATAZ) 200 MG capsule Take 2 capsules (400 mg total) by mouth daily with breakfast.  . dolutegravir (TIVICAY) 50 MG tablet Take 1 tablet (50 mg total) by mouth daily.  Marland Kitchen emtricitabine-tenofovir (TRUVADA) 200-300 MG tablet Take 1 tablet by mouth daily.  . Prenat-FeFum-FePo-FA-Omega 3 (CONCEPT DHA) 53.5-38-1 MG CAPS Take 1 tablet by mouth daily.  . ritonavir (NORVIR) 100 MG TABS tablet Take 1 tablet (100 mg total) by mouth daily.   No facility-administered encounter medications on file as of 05/17/2016.      Patient Active Problem List   Diagnosis Date Noted  . Supervision of high-risk pregnancy 04/05/2016  . Low blood pressure, not hypotension 01/02/2016  . Lead exposure 12/16/2015  . Raised antibody titer 10/25/2015  . HIV disease (HCC) 09/30/2015     There are no preventive care reminders to display for this patient.   Review of Systems +vaginal spotting. Otherwise 10 point ros is negative Physical Exam   BP 115/75   Pulse 96   Temp 97.6 F (36.4 C) (Oral)   Wt 137 lb (62.1 kg)   LMP 09/10/2015 (Within Days)   Breastfeeding? Unknown   BMI 25.06 kg/m  Physical Exam  Constitutional:  oriented to person, place, and time. appears well-developed and well-nourished. No distress.   HENT: /AT, PERRLA, no scleral icterus Mouth/Throat: Oropharynx is clear and moist. No oropharyngeal exudate.  Cardiovascular: Normal rate, regular rhythm and normal heart sounds. Exam reveals no gallop and no friction rub.  No murmur heard.  Pulmonary/Chest: Effort normal and breath sounds normal. No respiratory distress.  has no wheezes.  Neck = supple, no nuchal rigidity Abdominal: Soft. Bowel sounds are normal.  exhibits no distension. There is no tenderness.  Lymphadenopathy: no cervical adenopathy. No axillary adenopathy Neurological: alert and oriented to person, place, and time.  Skin: Skin is warm and dry. No rash noted. No erythema.  Psychiatric: a normal mood and affect.  behavior is normal.   Lab Results  Component Value Date   CD4TCELL 24 (L) 05/07/2016   Lab Results  Component Value Date   CD4TABS 300 (L) 05/07/2016   CD4TABS 260 (L) 03/22/2016   Lab Results  Component Value Date   HIV1RNAQUANT <20 05/07/2016   No results found for: HEPBSAB No results found for: RPR  CBC Lab Results  Component Value Date   WBC 8.1 03/22/2016   RBC 3.86 03/22/2016   HGB 12.2 03/22/2016   HCT 35.4 03/22/2016   PLT 114 (L) 03/22/2016   MCV 91.7 03/22/2016   MCH 31.6 03/22/2016   MCHC 34.5 03/22/2016   RDW 13.7 03/22/2016   LYMPHSABS 1,053 03/22/2016   MONOABS 486 03/22/2016   EOSABS 81 03/22/2016   BASOSABS 0 03/22/2016   BMET Lab Results  Component Value Date  NA 139 03/22/2016   K 3.9 03/22/2016   CL 105 03/22/2016   CO2 23 03/22/2016   GLUCOSE 89 03/22/2016   BUN 6 (L) 03/22/2016   CREATININE 0.38 (L) 03/22/2016   CALCIUM 8.6 03/22/2016   GFRNONAA >89 03/22/2016   GFRAA >89 03/22/2016     Assessment and Plan  hiv disease = will switch to tivicay and descovy. Stop her previous regimen  Anticipatory guidelines for her baby = informed them that they will still need to give their baby liquid medication for the next 4 wk and follow up with peds ID clinic  for follow up testing. Reassured her that since the patient had undectable viral load that she had the best outcome.  Spent 35 min with patient with greater than 50% in counseling for hiv disease and adherence

## 2016-05-25 ENCOUNTER — Ambulatory Visit: Payer: Medicaid Other | Admitting: Internal Medicine

## 2016-06-05 ENCOUNTER — Inpatient Hospital Stay (HOSPITAL_COMMUNITY)
Admission: AD | Admit: 2016-06-05 | Discharge: 2016-06-05 | Disposition: A | Discharge status: Home / Self Care | Payer: Medicaid Other | Source: Ambulatory Visit | Attending: Obstetrics and Gynecology | Admitting: Obstetrics and Gynecology

## 2016-06-05 ENCOUNTER — Encounter (HOSPITAL_COMMUNITY): Payer: Self-pay

## 2016-06-05 DIAGNOSIS — Z711 Person with feared health complaint in whom no diagnosis is made: Secondary | ICD-10-CM | POA: Insufficient documentation

## 2016-06-05 DIAGNOSIS — R109 Unspecified abdominal pain: Secondary | ICD-10-CM | POA: Diagnosis present

## 2016-06-05 MED ORDER — IBUPROFEN 600 MG PO TABS: 600.0000 mg | ORAL_TABLET | Freq: Four times a day (QID) | ORAL | 1 refills | Status: DC | PRN

## 2016-06-05 NOTE — MAU Note (Signed)
Urine in lab 

## 2016-06-05 NOTE — MAU Provider Note (Signed)
  History     CSN: [id number]  Arrival date and time: 06/05/16 1439   First Provider Initiated Contact with Patient 06/05/16 1518      Chief Complaint  Patient presents with  . Abdominal Pain   HPI 25 yo G1P1 s/p SVD at 34 weeks on 05/09/2016 at [facility] presenting today with concerns for persistent vaginal bleeding. Patient denies any fevers/chills, or foul smelling discharge. She states that the period was initially heavy like a period but now is much lighter. She denies passage of clots. She reports some mild cramping pain. She is feeding by formula and has abstain from intercourse.     Past Medical History:  Diagnosis Date  . HIV disease (HCC) 09/30/2015  . Low blood pressure, not hypotension 01/02/2016  . Shingles     Past Surgical History:  Procedure Laterality Date  . NO PAST SURGERIES      History reviewed. No pertinent family history.  Social History  Substance Use Topics  . Smoking status: Never Smoker  . Smokeless tobacco: Never Used  . Alcohol use No    Allergies: No Known Allergies  Prescriptions Prior to Admission  Medication Sig Dispense Refill Last Dose  . dolutegravir (TIVICAY) 50 MG tablet Take 1 tablet (50 mg total) by mouth daily. 30 tablet 11   . emtricitabine-tenofovir AF (DESCOVY) 200-25 MG tablet Take 1 tablet by mouth daily. 30 tablet 11   . Prenat-FeFum-FePo-FA-Omega 3 (CONCEPT DHA) 53.5-38-1 MG CAPS Take 1 tablet by mouth daily.  3 Taking    ROS  See pertinent in HPI Physical Exam   Blood pressure 99/66, pulse 72, temperature 98.1 F (36.7 C), temperature source Oral, resp. rate 16, last menstrual period 09/10/2015, unknown if currently breastfeeding.  Physical Exam GENERAL: Well-developed, well-nourished female in no acute distress.  HEENT: Normocephalic, atraumatic. Sclerae anicteric.  NECK: Supple. Normal thyroid.  LUNGS: Clear to auscultation bilaterally.  HEART: Regular rate and rhythm. ABDOMEN: Soft, nontender, nondistended.  No organomegaly. PELVIC: Normal external female genitalia. Vagina is pink and rugated.  Normal discharge. Normal appearing cervix. Scant amount of blood in vault. Uterus is 10-weeks in size. No adnexal mass or tenderness. EXTREMITIES: No cyanosis, clubbing, or edema, 2+ distal pulses.  MAU Course  Procedures  MDM   Assessment and Plan  25 yo 4 weeks postpartum following SVD with normal postpartum vaginal bleeding - Reassurance provided - Advised to continue taking ibuprofen for pain - Follow up as scheduled for pp visit on 11/26 - RTC if symptoms worsen - Interpreter used for this encounter  Kyshaun Barnette 06/05/2016, 3:38 PM

## 2016-06-05 NOTE — Discharge Instructions (Signed)

## 2016-06-05 NOTE — MAU Note (Signed)
Patient presents with c/o vaginal bleeding and abdominal cramping everyday after eating breakfast. Patient delivered vaginally on October 18th.

## 2016-06-09 DIAGNOSIS — O42919 Preterm premature rupture of membranes, unspecified as to length of time between rupture and onset of labor, unspecified trimester: Secondary | ICD-10-CM

## 2016-06-13 ENCOUNTER — Ambulatory Visit (INDEPENDENT_AMBULATORY_CARE_PROVIDER_SITE_OTHER): Payer: Medicaid Other | Admitting: Family Medicine

## 2016-06-13 ENCOUNTER — Encounter: Payer: Self-pay | Admitting: Family Medicine

## 2016-06-13 DIAGNOSIS — Z30013 Encounter for initial prescription of injectable contraceptive: Secondary | ICD-10-CM | POA: Diagnosis not present

## 2016-06-13 DIAGNOSIS — Z3009 Encounter for other general counseling and advice on contraception: Secondary | ICD-10-CM

## 2016-06-13 DIAGNOSIS — Z21 Asymptomatic human immunodeficiency virus [HIV] infection status: Secondary | ICD-10-CM

## 2016-06-13 NOTE — Patient Instructions (Signed)
Hormonal Contraception Information Introduction Estrogen and progesterone (progestin) are hormones used in many forms of birth control (contraception). These two hormones make up most hormonal contraceptives. Hormonal contraceptives use either:  A combination of estrogen hormone and progesterone hormone in one of these forms:  Pill. Pills come in various combinations of active hormone pills and nonhormonal pills. Different combinations of pills may give you a period once a month, once every 3 months, or no period at all. It is important to take the pills the same time each day.  Patch. The patch is placed on the lower abdomen every week for 3 weeks. On the fourth week, the patch is not placed.  Vaginal ring. The ring is placed in the vagina and left there for 3 weeks. It is then removed for 1 week.  Progesterone alone in one of these forms:  Pill. Hormone pills are taken every day of the cycle.  Intrauterine device (IUD). The IUD is inserted during a menstrual period and removed or replaced every 5 years or sooner.  Implant. Plastic rods are placed under the skin of the upper arm. They are removed or replaced every 3 years or sooner.  Injection. The injection is given once every 90 days. Pregnancy can still occur with any of these hormonal contraceptive methods. If you have any suspicion that you might be pregnant, take a pregnancy test and talk to your health care provider. Estrogen and progesterone contraceptives Estrogen and progesterone contraceptives can prevent pregnancy by:  Stopping the release of an egg (ovulation).  Thickening the mucus of the cervix, making it difficult for sperm to enter the uterus.  Changing the lining of the uterus. This change makes it more difficult for an egg to implant. Progesterone contraceptives Progesterone-only contraceptives can prevent pregnancy by:  Blocking ovulation. This occurs in many women, but some women will continue to  ovulate.  Preventing the entry of sperm into the uterus by keeping the cervical mucus thick and sticky.  Changing the lining of the uterus. This change makes it more difficult for an egg to implant. Side effects Talk to your health care provider about what side effects may affect you. If you develop persistent side effects or if the effects are severe, talk to your health care provider.  Estrogen. Side effects from estrogen occur more often in the first 2-3 months. They include:  Progesterone. Side effects of progesterone can vary. They include: Questions to ask This information is not intended to replace advice given to you by your health care provider. Make sure you discuss any questions you have with your health care provider. Document Released: 07/29/2007 Document Revised: 04/11/2016 Document Reviewed: 12/21/2012  2017 Elsevier  

## 2016-06-13 NOTE — Progress Notes (Signed)
Subjective:     Mary Zhang is a 25 y.o. female who presents for a postpartum visit. She is 6 weeks postpartum following a vaginal . I have fully reviewed the prenatal and intrapartum course. [facility] gestational weeks [facility]. Outcome: vaginal. Anesthesia: none. Postpartum course has been uncomplicated, patient has known HIV, has seen ID physician, switched back to Tivicay and Descovy. Baby's course has been uncomplicated, was in the ICU for 8 days. Baby is feeding by bottle. Bleeding yes at this time. Has not stopped having VB since delivery, but it is light like spotting. No clots.  Bowel function is normal. Bladder function is normal. Patient is not sexually active. Contraception method is depo-provera, received in the hospital before discharge. Due for next shot in about 2 months. Postpartum depression screening: negative   The following portions of the patient's history were reviewed and updated as appropriate: allergies, current medications, past family history, past medical history, past social history, past surgical history and problem list.  Review of Systems Pertinent items are noted in HPI.   Objective:    BP (!) 80/31   Pulse 72   Wt 139 lb (63 kg)   BMI 25.42 kg/m   General:  alert, cooperative, appears stated age and no distress   Breasts:  inspection negative, no nipple discharge or bleeding, no masses or nodularity palpable  Lungs: clear to auscultation bilaterally  Heart:  regular rate and rhythm, S1, S2 normal, no murmur, click, rub or gallop  Abdomen: soft, non-tender; bowel sounds normal; no masses,  no organomegaly  Extremities: No edema  Neuro: CN II - XII grossly intact        Assessment:     6 weeks postpartum exam. Pap smear done at Dr. Feliz BeamSnider's office 09/2015, but do not have results.    Plan:    1. Contraception: Depo-Provera injections 2. HIV - following with ID, Dr. Drue SecondSnider. Need to obtain records from Dr. Feliz BeamSnider's office from 09/2015. If  cannot find records, would recommend to return to office for repeat pap.  3. Follow up in: 2 months for Depo shot or as needed.

## 2016-06-28 ENCOUNTER — Ambulatory Visit (INDEPENDENT_AMBULATORY_CARE_PROVIDER_SITE_OTHER): Payer: Medicaid Other | Admitting: Internal Medicine

## 2016-06-28 ENCOUNTER — Encounter: Payer: Self-pay | Admitting: Internal Medicine

## 2016-06-28 VITALS — BP 102/68 | HR 86 | Temp 98.5°F

## 2016-06-28 DIAGNOSIS — B20 Human immunodeficiency virus [HIV] disease: Secondary | ICD-10-CM | POA: Diagnosis present

## 2016-06-28 NOTE — Progress Notes (Signed)
Patient ID: Mary Zhang, female   DOB: 09/07/1990, 25 y.o.   MRN: [id number]  HPI Mary Zhang is a 25yo F who is currently on tivicay/descovy 300/VL<20 in Oct 2017. We switched her to her current regimen on 10/26 at our last visit. She Had baby girl premature @ 24 wk due to PPROM. Baby's course uncomplicated had ICU stay x 8days for feed/grow evaluatoin.  Baby wakes and eats every 3 hrs.  Contraception currently on depo, next due end of january  Outpatient Encounter Prescriptions as of 06/28/2016  Medication Sig  . dolutegravir (TIVICAY) 50 MG tablet Take 1 tablet (50 mg total) by mouth daily.  Marland Kitchen. emtricitabine-tenofovir AF (DESCOVY) 200-25 MG tablet Take 1 tablet by mouth daily.  Marland Kitchen. ibuprofen (ADVIL,MOTRIN) 600 MG tablet Take 1 tablet (600 mg total) by mouth every 6 (six) hours as needed.  . Prenat-FeFum-FePo-FA-Omega 3 (CONCEPT DHA) 53.5-38-1 MG CAPS Take 1 tablet by mouth daily.   No facility-administered encounter medications on file as of 06/28/2016.      Patient Active Problem List   Diagnosis Date Noted  . Supervision of high-risk pregnancy 04/05/2016  . Low blood pressure, not hypotension 01/02/2016  . Lead exposure 12/16/2015  . Raised antibody titer 10/25/2015  . HIV disease (HCC) 09/30/2015     There are no preventive care reminders to display for this patient.   Review of Systems  Constitutional: Negative for fever, chills, diaphoresis, activity change, appetite change, fatigue and unexpected weight change.  HENT: Negative for congestion, sore throat, rhinorrhea, sneezing, trouble swallowing and sinus pressure.  Eyes: Negative for photophobia and visual disturbance.  Respiratory: Negative for cough, chest tightness, shortness of breath, wheezing and stridor.  Cardiovascular: Negative for chest pain, palpitations and leg swelling.  Gastrointestinal: Negative for nausea, vomiting, abdominal pain, diarrhea, constipation, blood in stool, abdominal distention and anal bleeding.   Genitourinary: Negative for dysuria, hematuria, flank pain and difficulty urinating.  Musculoskeletal: Negative for myalgias, back pain, joint swelling, arthralgias and gait problem.  Skin: Negative for color change, pallor, rash and wound.  Neurological: Negative for dizziness, tremors, weakness and light-headedness.  Hematological: Negative for adenopathy. Does not bruise/bleed easily.  Psychiatric/Behavioral: Negative for behavioral problems, confusion, sleep disturbance, dysphoric mood, decreased concentration and agitation.    Physical Exam  BP 102/68   Pulse 86   Temp 98.5 F (36.9 C)   SpO2 100%  .Physical Exam  Constitutional:  oriented to person, place, and time. appears well-developed and well-nourished. No distress.  HENT: /AT, PERRLA, no scleral icterus Mouth/Throat: Oropharynx is clear and moist. No oropharyngeal exudate.  Cardiovascular: Normal rate, regular rhythm and normal heart sounds. Exam reveals no gallop and no friction rub.  No murmur heard.  Pulmonary/Chest: Effort normal and breath sounds normal. No respiratory distress.  has no wheezes.  Neck = supple, no nuchal rigidity Abdominal: Soft. Bowel sounds are normal.  exhibits no distension. There is no tenderness.  Lymphadenopathy: no cervical adenopathy. No axillary adenopathy Neurological: alert and oriented to person, place, and time.  Skin: Skin is warm and dry. No rash noted. No erythema.  Psychiatric: a normal mood and affect.  behavior is normal.    Lab Results  Component Value Date   CD4TCELL 24 (L) 05/07/2016   Lab Results  Component Value Date   CD4TABS 300 (L) 05/07/2016   CD4TABS 260 (L) 03/22/2016   Lab Results  Component Value Date   HIV1RNAQUANT <20 05/07/2016   No results found for: HEPBSAB No results found for:  RPR  CBC Lab Results  Component Value Date   WBC 8.1 03/22/2016   RBC 3.86 03/22/2016   HGB 12.2 03/22/2016   HCT 35.4 03/22/2016   PLT 114 (L) 03/22/2016   MCV  91.7 03/22/2016   MCH 31.6 03/22/2016   MCHC 34.5 03/22/2016   RDW 13.7 03/22/2016   LYMPHSABS 1,053 03/22/2016   MONOABS 486 03/22/2016   EOSABS 81 03/22/2016   BASOSABS 0 03/22/2016   BMET Lab Results  Component Value Date   NA 139 03/22/2016   K 3.9 03/22/2016   CL 105 03/22/2016   CO2 23 03/22/2016   GLUCOSE 89 03/22/2016   BUN 6 (L) 03/22/2016   CREATININE 0.38 (L) 03/22/2016   CALCIUM 8.6 03/22/2016   GFRNONAA >89 03/22/2016   GFRAA >89 03/22/2016     Assessment and Plan  hiv = will check VL to ensure she is undetectable  Health maintenance = uptodate.  Women's health = depo in 2 months

## 2016-06-30 LAB — HIV-1 RNA QUANT-NO REFLEX-BLD
HIV 1 RNA Quant: <20 copies/mL (ref <20)
HIV-1 RNA Quant, Log: <1.3 Log copies/mL (ref <1.30)

## 2016-08-13 ENCOUNTER — Ambulatory Visit: Payer: Medicaid Other

## 2016-08-21 ENCOUNTER — Ambulatory Visit (INDEPENDENT_AMBULATORY_CARE_PROVIDER_SITE_OTHER): Payer: Medicaid Other | Admitting: Internal Medicine

## 2016-08-21 VITALS — BP 112/72 | HR 92 | Temp 97.6°F | Wt 140.0 lb

## 2016-08-21 DIAGNOSIS — B20 Human immunodeficiency virus [HIV] disease: Secondary | ICD-10-CM | POA: Diagnosis not present

## 2016-08-21 DIAGNOSIS — N926 Irregular menstruation, unspecified: Secondary | ICD-10-CM

## 2016-08-21 LAB — HCG, SERUM, QUALITATIVE: Preg, Serum: NEGATIVE

## 2016-08-21 MED ORDER — LEVONORGESTREL-ETHINYL ESTRAD 0.1-20 MG-MCG PO TABS: 1.0000 | ORAL_TABLET | Freq: Every day | ORAL | 11 refills | Status: DC

## 2016-08-21 NOTE — Progress Notes (Signed)
RFV: follow up for HIV disease  Patient ID: Mary Zhang, female   DOB: 1991/06/28, 26 y.o.   MRN: [id number]  HPI 25yo F with HIV disease, CD 4 count 300 (oct)/VL<20 (dec) currently on tivicay-descovy. She had premature rupture of membranes on 10/17, had baby at [redacted]wk gestation. Thus far doing well. Baby is hiv negative. She is taking her medications daily. She did not attend her last OB visit. She states that she had been on the depo shot but she had spotting for the duration of 3 months which stopped roughly 2 wks ago, but she is overdue for her depo shot. She and her husband have unprotected sex.  Outpatient Encounter Prescriptions as of 08/21/2016  Medication Sig  . dolutegravir (TIVICAY) 50 MG tablet Take 1 tablet (50 mg total) by mouth daily.  Marland Kitchen emtricitabine-tenofovir AF (DESCOVY) 200-25 MG tablet Take 1 tablet by mouth daily.  Marland Kitchen ibuprofen (ADVIL,MOTRIN) 600 MG tablet Take 1 tablet (600 mg total) by mouth every 6 (six) hours as needed.  . Prenat-FeFum-FePo-FA-Omega 3 (CONCEPT DHA) 53.5-38-1 MG CAPS Take 1 tablet by mouth daily.   No facility-administered encounter medications on file as of 08/21/2016.      Patient Active Problem List   Diagnosis Date Noted  . Supervision of high-risk pregnancy 04/05/2016  . Low blood pressure, not hypotension 01/02/2016  . Lead exposure 12/16/2015  . Raised antibody titer 10/25/2015  . HIV disease (HCC) 09/30/2015     There are no preventive care reminders to display for this patient.   Review of Systems Review of Systems  Constitutional: Negative for fever, chills, diaphoresis, activity change, appetite change, fatigue and unexpected weight change.  HENT: Negative for congestion, sore throat, rhinorrhea, sneezing, trouble swallowing and sinus pressure.  Eyes: Negative for photophobia and visual disturbance.  Respiratory: Negative for cough, chest tightness, shortness of breath, wheezing and stridor.  Cardiovascular: Negative for chest pain,  palpitations and leg swelling.  Gastrointestinal: Negative for nausea, vomiting, abdominal pain, diarrhea, constipation, blood in stool, abdominal distention and anal bleeding.  Genitourinary: Negative for dysuria, hematuria, flank pain and difficulty urinating.  Musculoskeletal: Negative for myalgias, back pain, joint swelling, arthralgias and gait problem.  Skin: Negative for color change, pallor, rash and wound.  Neurological: Negative for dizziness, tremors, weakness and light-headedness.  Hematological: Negative for adenopathy. Does not bruise/bleed easily.  Psychiatric/Behavioral: Negative for behavioral problems, confusion, sleep disturbance, dysphoric mood, decreased concentration and agitation.    Physical Exam   BP 112/72   Pulse 92   Temp 97.6 F (36.4 C) (Oral)   Wt 140 lb (63.5 kg)   BMI 25.61 kg/m   Physical Exam  Constitutional:  oriented to person, place, and time. appears well-developed and well-nourished. No distress.  HENT: [place]/AT, PERRLA, no scleral icterus Mouth/Throat: Oropharynx is clear and moist. No oropharyngeal exudate.  Cardiovascular: Normal rate, regular rhythm and normal heart sounds. Exam reveals no gallop and no friction rub.  No murmur heard.  Pulmonary/Chest: Effort normal and breath sounds normal. No respiratory distress.  has no wheezes.  Neck = supple, no nuchal rigidity Abdominal: Soft. Bowel sounds are normal.  exhibits no distension. There is no tenderness.  Lymphadenopathy: no cervical adenopathy. No axillary adenopathy Neurological: alert and oriented to person, place, and time.  Skin: Skin is warm and dry. No rash noted. No erythema.  Psychiatric: a normal mood and affect.  behavior is normal.   Lab Results  Component Value Date   CD4TCELL 24 (L) 05/07/2016  Lab Results  Component Value Date   CD4TABS 300 (L) 05/07/2016   CD4TABS 260 (L) 03/22/2016   Lab Results  Component Value Date   HIV1RNAQUANT <20 06/28/2016   No results  found for: HEPBSAB Lab Results  Component Value Date   LABRPR Nonreactive 12/14/2015    CBC Lab Results  Component Value Date   WBC 8.1 03/22/2016   RBC 3.86 03/22/2016   HGB 12.2 03/22/2016   HCT 35.4 03/22/2016   PLT 114 (L) 03/22/2016   MCV 91.7 03/22/2016   MCH 31.6 03/22/2016   MCHC 34.5 03/22/2016   RDW 13.7 03/22/2016   LYMPHSABS 1,053 03/22/2016   MONOABS 486 03/22/2016   EOSABS 81 03/22/2016    BMET Lab Results  Component Value Date   NA 139 03/22/2016   K 3.9 03/22/2016   CL 105 03/22/2016   CO2 23 03/22/2016   GLUCOSE 89 03/22/2016   BUN 6 (L) 03/22/2016   CREATININE 0.38 (L) 03/22/2016   CALCIUM 8.6 03/22/2016   GFRNONAA >89 03/22/2016   GFRAA >89 03/22/2016      Assessment and Plan hiv disease= continue on current regimen. I suspect her medicaid pregnancy coverage is near lapsing. We will have bridge counselor help with determining what coverage she needs and start ADAP paperwork  Abnormal menses - likely from depo but we check urine/blood pregnancy. If negative, will start birth control pills.    - continue on descovy/tivicay. Will check labs to see viral load is suppressed  Conducted visit with phone interpreter

## 2016-09-19 ENCOUNTER — Encounter: Payer: Self-pay | Admitting: Internal Medicine

## 2016-09-26 ENCOUNTER — Ambulatory Visit (INDEPENDENT_AMBULATORY_CARE_PROVIDER_SITE_OTHER): Payer: Self-pay | Admitting: Internal Medicine

## 2016-09-26 ENCOUNTER — Encounter: Payer: Self-pay | Admitting: Internal Medicine

## 2016-09-26 VITALS — BP 107/68 | HR 72 | Temp 98.2°F | Wt 140.0 lb

## 2016-09-26 DIAGNOSIS — B20 Human immunodeficiency virus [HIV] disease: Secondary | ICD-10-CM

## 2016-09-26 DIAGNOSIS — N921 Excessive and frequent menstruation with irregular cycle: Secondary | ICD-10-CM

## 2016-09-26 LAB — CBC WITH DIFFERENTIAL/PLATELET
BASOS ABS: 0 {cells}/uL (ref 0–200)
Basophils Relative: 0 %
EOS ABS: 120 {cells}/uL (ref 15–500)
EOS PCT: 2 %
HEMATOCRIT: 40.9 % (ref 35.0–45.0)
HEMOGLOBIN: 14.1 g/dL (ref 11.7–15.5)
LYMPHS ABS: 1560 {cells}/uL (ref 850–3900)
Lymphocytes Relative: 26 %
MCH: 31.3 pg (ref 27.0–33.0)
MCHC: 34.5 g/dL (ref 32.0–36.0)
MCV: 90.9 fL (ref 80.0–100.0)
MONO ABS: 360 {cells}/uL (ref 200–950)
MPV: 10.8 fL (ref 7.5–12.5)
Monocytes Relative: 6 %
Neutro Abs: 3960 cells/uL (ref 1500–7800)
Neutrophils Relative %: 66 %
Platelets: 187 10*3/uL (ref 140–400)
RBC: 4.5 MIL/uL (ref 3.80–5.10)
RDW: 13.5 % (ref 11.0–15.0)
WBC: 6 10*3/uL (ref 3.8–10.8)

## 2016-09-26 NOTE — Progress Notes (Signed)
RFV: hiv disease follow up  Patient ID: Mary Zhang, female   DOB: [birth year], 26 y.o.   MRN: [id number]  HPI 26yo F with well controlled HIV disease. She had healthy birth of her daughter in November, now 71 months old. She had vaginal delivery but did not make her post delivery 30 d check up. She is having unprotected sex with her husband currently and was not interested in depo shot at last appt. She is still having post delivery vaginal spotting. She reports having menses 3 weeks ago but still having spotting for the past 2 wks. Using 3 pads per day?   Outpatient Encounter Prescriptions as of 09/26/2016  Medication Sig  . dolutegravir (TIVICAY) 50 MG tablet Take 1 tablet (50 mg total) by mouth daily.  Marland Kitchen emtricitabine-tenofovir AF (DESCOVY) 200-25 MG tablet Take 1 tablet by mouth daily.  Marland Kitchen levonorgestrel-ethinyl estradiol (AVIANE,ALESSE,LESSINA) 0.1-20 MG-MCG tablet Take 1 tablet by mouth daily.  . Prenat-FeFum-FePo-FA-Omega 3 (CONCEPT DHA) 53.5-38-1 MG CAPS Take 1 tablet by mouth daily.  . [DISCONTINUED] ibuprofen (ADVIL,MOTRIN) 600 MG tablet Take 1 tablet (600 mg total) by mouth every 6 (six) hours as needed.   No facility-administered encounter medications on file as of 09/26/2016.      Patient Active Problem List   Diagnosis Date Noted  . Supervision of high-risk pregnancy 04/05/2016  . Low blood pressure, not hypotension 01/02/2016  . Lead exposure 12/16/2015  . Raised antibody titer 10/25/2015  . HIV disease (HCC) 09/30/2015     There are no preventive care reminders to display for this patient.   Review of Systems +vaginal bleeding. 10 point ros is otherwise negative Physical Exam   BP 107/68   Pulse 72   Temp 98.2 F (36.8 C) (Oral)   Wt 140 lb (63.5 kg)   LMP 08/29/2016 (Approximate)   Breastfeeding? No   BMI 25.61 kg/m  Physical Exam  Constitutional:  oriented to person, place, and time. appears well-developed and well-nourished. No distress.  HENT: [place]/AT,  PERRLA, no scleral icterus Mouth/Throat: Oropharynx is clear and moist. No oropharyngeal exudate.  Cardiovascular: Normal rate, regular rhythm and normal heart sounds. Exam reveals no gallop and no friction rub.  No murmur heard.  Pulmonary/Chest: Effort normal and breath sounds normal. No respiratory distress.  has no wheezes.  Neck = supple, no nuchal rigidity Abdominal: Soft. Bowel sounds are normal.  exhibits no distension. There is no tenderness.  Lymphadenopathy: no cervical adenopathy. No axillary adenopathy Neurological: alert and oriented to person, place, and time.  Skin: Skin is warm and dry. No rash noted. No erythema.  Psychiatric: a normal mood and affect.  behavior is normal.    Lab Results  Component Value Date   CD4TCELL 24 (L) 05/07/2016   Lab Results  Component Value Date   CD4TABS 300 (L) 05/07/2016   CD4TABS 260 (L) 03/22/2016   Lab Results  Component Value Date   HIV1RNAQUANT <20 06/28/2016   No results found for: HEPBSAB Lab Results  Component Value Date   LABRPR Nonreactive 12/14/2015    CBC Lab Results  Component Value Date   WBC 8.1 03/22/2016   RBC 3.86 03/22/2016   HGB 12.2 03/22/2016   HCT 35.4 03/22/2016   PLT 114 (L) 03/22/2016   MCV 91.7 03/22/2016   MCH 31.6 03/22/2016   MCHC 34.5 03/22/2016   RDW 13.7 03/22/2016   LYMPHSABS 1,053 03/22/2016   MONOABS 486 03/22/2016   EOSABS 81 03/22/2016    BMET Lab Results  Component Value Date   NA 139 03/22/2016   K 3.9 03/22/2016   CL 105 03/22/2016   CO2 23 03/22/2016   GLUCOSE 89 03/22/2016   BUN 6 (L) 03/22/2016   CREATININE 0.38 (L) 03/22/2016   CALCIUM 8.6 03/22/2016   GFRNONAA >89 03/22/2016   GFRAA >89 03/22/2016      Assessment and Plan  hiv disease= considered changing to [place] as her new hiv regimen but we will keep her on tivicay plus descovy for now until she follows up with gynecology. She is well controlled blood draw during delivery. Will check labs  today  Menorrhagia/ irregular vaginal bleeding - unsure if some of discussion is lost in translation but she reports using 3 small pads per day due to vaginal spotting since her menses - will refer to gynecology - hopefully also discuss benefits of birth spacing and  birth control  She no longer has bridge counseling which was established [facility] in the setting of her pregnancy

## 2016-09-27 LAB — COMPLETE METABOLIC PANEL WITH GFR
ALBUMIN: 4.2 g/dL (ref 3.6–5.1)
ALK PHOS: 60 U/L (ref 33–115)
ALT: 20 U/L (ref 6–29)
AST: 18 U/L (ref 10–30)
BILIRUBIN TOTAL: 0.5 mg/dL (ref 0.2–1.2)
BUN: 7 mg/dL (ref 7–25)
CALCIUM: 9.1 mg/dL (ref 8.6–10.2)
CO2: 23 mmol/L (ref 20–31)
Chloride: 110 mmol/L (ref 98–110)
Creat: 0.59 mg/dL (ref 0.50–1.10)
Glucose, Bld: 117 mg/dL — ABNORMAL HIGH (ref 65–99)
POTASSIUM: 3.6 mmol/L (ref 3.5–5.3)
Sodium: 141 mmol/L (ref 135–146)
Total Protein: 7.1 g/dL (ref 6.1–8.1)

## 2016-09-27 LAB — T-HELPER CELL (CD4) - (RCID CLINIC ONLY)
CD4 T CELL ABS: 390 /uL — AB (ref 400–2700)
CD4 T CELL HELPER: 27 % — AB (ref 33–55)

## 2016-09-28 LAB — HIV-1 RNA QUANT-NO REFLEX-BLD
HIV 1 RNA Quant: <20 copies/mL
HIV-1 RNA QUANT, LOG: NOT DETECTED {Log_copies}/mL

## 2016-11-15 ENCOUNTER — Ambulatory Visit (INDEPENDENT_AMBULATORY_CARE_PROVIDER_SITE_OTHER): Payer: Self-pay | Admitting: Internal Medicine

## 2016-11-15 VITALS — BP 95/62 | HR 69 | Temp 98.2°F | Wt 142.0 lb

## 2016-11-15 DIAGNOSIS — B20 Human immunodeficiency virus [HIV] disease: Secondary | ICD-10-CM

## 2016-11-15 DIAGNOSIS — N926 Irregular menstruation, unspecified: Secondary | ICD-10-CM

## 2016-11-15 LAB — HCG, QUANTITATIVE, PREGNANCY

## 2016-11-15 MED ORDER — NORGESTIM-ETH ESTRAD TRIPHASIC 0.18/0.215/0.25 MG-35 MCG PO TABS: 1.0000 | ORAL_TABLET | Freq: Every day | ORAL | 11 refills | Status: DC

## 2016-11-15 MED ORDER — BICTEGRAVIR-EMTRICITAB-TENOFOV 50-200-25 MG PO TABS: 1.0000 | ORAL_TABLET | Freq: Every day | ORAL | 11 refills | Status: DC

## 2016-11-15 NOTE — Progress Notes (Signed)
RFV: follow up for hiv disease  Patient ID: Mary Zhang, female   DOB: Jul 26, 1990, 26 y.o.   MRN: [id number]  HPI Mary Zhang is a 26yo F with HIV disease, CD 4 count of 390/VL<20 in March 2018, currently on tivicay/descovy. Her daughter is nearly 54 months old. When we last saw her in march she still had some irregular vaginal bleeding, we referred her to gynecology buti nthe mean time had lost her [employer].  She is here with her husband.   Outpatient Encounter Prescriptions as of 11/15/2016  Medication Sig  . dolutegravir (TIVICAY) 50 MG tablet Take 1 tablet (50 mg total) by mouth daily.  Marland Kitchen emtricitabine-tenofovir AF (DESCOVY) 200-25 MG tablet Take 1 tablet by mouth daily.  Marland Kitchen levonorgestrel-ethinyl estradiol (AVIANE,ALESSE,LESSINA) 0.1-20 MG-MCG tablet Take 1 tablet by mouth daily.  . Prenat-FeFum-FePo-FA-Omega 3 (CONCEPT DHA) 53.5-38-1 MG CAPS Take 1 tablet by mouth daily.   No facility-administered encounter medications on file as of 11/15/2016.      Patient Active Problem List   Diagnosis Date Noted  . Supervision of high-risk pregnancy 04/05/2016  . Low blood pressure, not hypotension 01/02/2016  . Lead exposure 12/16/2015  . Raised antibody titer 10/25/2015  . HIV disease (HCC) 09/30/2015     There are no preventive care reminders to display for this patient.   Review of Systems Irregular menses, fatigue from poor sleep. Otherwise 12 point ros is negative Physical Exam   BP 95/62   Pulse 69   Temp 98.2 F (36.8 C) (Oral)   Wt 142 lb (64.4 kg)   BMI 25.97 kg/m   Physical Exam  Constitutional:  oriented to person, place, and time. appears well-developed and well-nourished. No distress.  HENT: [place]/AT, PERRLA, no scleral icterus Mouth/Throat: Oropharynx is clear and moist. No oropharyngeal exudate.  Cardiovascular: Normal rate, regular rhythm and normal heart sounds. Exam reveals no gallop and no friction rub.  No murmur heard.  Pulmonary/Chest: Effort normal and  breath sounds normal. No respiratory distress.  has no wheezes.  Neck = supple, no nuchal rigidity Lymphadenopathy: no cervical adenopathy. No axillary adenopathy Neurological: alert and oriented to person, place, and time.  Skin: Skin is warm and dry. No rash noted. No erythema.  Psychiatric: a normal mood and affect.  behavior is normal.   Lab Results  Component Value Date   CD4TCELL 27 (L) 09/26/2016   Lab Results  Component Value Date   CD4TABS 390 (L) 09/26/2016   CD4TABS 300 (L) 05/07/2016   CD4TABS 260 (L) 03/22/2016   Lab Results  Component Value Date   HIV1RNAQUANT <20 NOT DETECTED 09/26/2016   No results found for: HEPBSAB Lab Results  Component Value Date   LABRPR Nonreactive 12/14/2015    CBC Lab Results  Component Value Date   WBC 6.0 09/26/2016   RBC 4.50 09/26/2016   HGB 14.1 09/26/2016   HCT 40.9 09/26/2016   PLT 187 09/26/2016   MCV 90.9 09/26/2016   MCH 31.3 09/26/2016   MCHC 34.5 09/26/2016   RDW 13.5 09/26/2016   LYMPHSABS 1,560 09/26/2016   MONOABS 360 09/26/2016   EOSABS 120 09/26/2016    BMET Lab Results  Component Value Date   NA 141 09/26/2016   K 3.6 09/26/2016   CL 110 09/26/2016   CO2 23 09/26/2016   GLUCOSE 117 (H) 09/26/2016   BUN 7 09/26/2016   CREATININE 0.59 09/26/2016   CALCIUM 9.1 09/26/2016   GFRNONAA >89 09/26/2016   GFRAA >89 09/26/2016  Assessment and Plan hiv disease = well controlled continue on current regimen  Abnormal menstrual bleeding = will check hcg and cbc. Will start on birthcontrol pills to regulate her cycle for the next 2-3 months

## 2016-12-05 ENCOUNTER — Other Ambulatory Visit: Payer: Self-pay | Admitting: General Practice

## 2016-12-05 ENCOUNTER — Encounter: Payer: Self-pay | Admitting: Obstetrics and Gynecology

## 2016-12-05 DIAGNOSIS — N921 Excessive and frequent menstruation with irregular cycle: Secondary | ICD-10-CM

## 2016-12-13 ENCOUNTER — Ambulatory Visit (HOSPITAL_COMMUNITY)
Admission: RE | Admit: 2016-12-13 | Discharge: 2016-12-13 | Disposition: A | Discharge status: Home / Self Care | Payer: Self-pay | Source: Ambulatory Visit | Attending: Obstetrics & Gynecology | Admitting: Obstetrics & Gynecology

## 2016-12-13 DIAGNOSIS — N921 Excessive and frequent menstruation with irregular cycle: Secondary | ICD-10-CM | POA: Insufficient documentation

## 2016-12-13 DIAGNOSIS — N83201 Unspecified ovarian cyst, right side: Secondary | ICD-10-CM | POA: Insufficient documentation

## 2016-12-26 ENCOUNTER — Ambulatory Visit (INDEPENDENT_AMBULATORY_CARE_PROVIDER_SITE_OTHER): Payer: Self-pay | Admitting: Obstetrics and Gynecology

## 2016-12-26 ENCOUNTER — Encounter: Payer: Self-pay | Admitting: Obstetrics and Gynecology

## 2016-12-26 VITALS — BP 106/59 | HR 85 | Wt 141.8 lb

## 2016-12-26 DIAGNOSIS — N83201 Unspecified ovarian cyst, right side: Secondary | ICD-10-CM

## 2016-12-26 DIAGNOSIS — N938 Other specified abnormal uterine and vaginal bleeding: Secondary | ICD-10-CM

## 2016-12-26 NOTE — Progress Notes (Signed)
26 yo G1P0101 here for the evaluation of irregular vaginal bleeding. Patient reports prolonged periods lasting 2-3 weeks at times. She often skips months making the next period heavier. She reports some dysmenorrhea and nausea with her cycle. She reports noticing this change since delivery 7 months ago. She is sexually active without contraception. She denies any headaches or leakage from her breast.   Past Medical History:  Diagnosis Date  . HIV disease (HCC) 09/30/2015  . Low blood pressure, not hypotension 01/02/2016  . Menorrhagia   . Shingles    Past Surgical History:  Procedure Laterality Date  . NO PAST SURGERIES     History reviewed. No pertinent family history. Social History  Substance Use Topics  . Smoking status: Never Smoker  . Smokeless tobacco: Never Used  . Alcohol use No   ROS See pertinent in HPI  Blood pressure (!) 106/59, pulse 85, weight 141 lb 12.8 oz (64.3 kg), last menstrual period 12/03/2016, not currently breastfeeding. GENERAL: Well-developed, well-nourished female in no acute distress.  NEURO: alert and oriented x 3   FINDINGS: Uterus  Measurements: 8.0 x 4.3 x 4.8 cm. Retroverted. Normal morphology without mass  Endometrium  Thickness: 6 mm thick, normal. No endometrial fluid or focal abnormality  Right ovary  Measurements: 5.3 x 3.0 x 4.0 cm. 2.9 x 2.9 x 3.0 cm diameter cyst with a single thin septation within RIGHT ovary. Blood flow present within RIGHT ovary on color Doppler imaging, without definite blood flow detected within the septation.  Left ovary  Measurements: 3.3 x 1.6 x 1.8 cm. Normal morphology without mass  Other findings  No free pelvic fluid or additional adnexal masses  IMPRESSION: Mildly complicated cyst within RIGHT ovary 2.9 x 2.9 x 3.0 cm in size.  Otherwise negative exam.   Electronically Signed   By: Ulyses SouthwardMark  Boles M.D.   On: 12/13/2016 14:17  A/P 26 yo with DUB - Discussed hormonal as  likely etiology of her DUB. Discussed contraception options as medical management for her DUB. Patient declined this option for now and states that she will observe to see if it will correct itself - Discussed endometrial ca as a long term consequence of anovulatory bleeding. Patient reports understanding and plan to return within 6 months if no resolution - Will schedule follow up ultrasound in late July to follow up on her right ovarian cyst

## 2016-12-26 NOTE — Progress Notes (Signed)
US scheduled for July 31st @ 0800.  Pt notified via [employer].

## 2016-12-26 NOTE — Progress Notes (Signed)
States periods comes irregularly and when it comes it last a month.

## 2016-12-26 NOTE — Patient Instructions (Signed)
???? ??????, ??? ???? ????, ???? ????? ???? ????? ???? ???.  ????? ???? ???? ?????? ?? ???: . ????? . ???? ?????? ?? ??? ?????? ?? . ????? ????? ????? . ????? ?????? ??? . ???? ?? ????? ???? ????.  ?? ???? ??? ???? ?????? ??? ????? ?? ?????.  ???? ????? ????   ?? ????  ???? ?? ???? ????? ?????? ????? (LARC)   . LARC ???? ?? ???? ???? ????? ??. . ??? ?? ??? ?The implant - ???? ?? ? 5 ???? ???? ??? ??? ??? ????? LARC ?? ? ?5-10 ???? ???? ???? ?? (IUD)  . ????? ??? ???? ????? ?????????? ???? ???????? ????. . ????? ?????? ????? ?????. . LARC?? ??? ?????? ?????? ????? ???. . ???? ???? ??? 1% ??? ??? ??? ??. . ?? LARC ???? ????.   ???? ?? ????? . ???? ???? ???????? ?????? ?????? ?????? ?????. ??? ?? Depo Provera ???? ?????. The Pill . ??? ???? ???? ?? - ????? ?????? ????? ??? ?? ??????-?? ?????. . ????? ????? ?????. . ???? ???? ??? ????? ??? ?? ??? ????? 8% ??? ?? ? 1% ??? ??. Depo Provera ??? . ???? ????? ?? ????. . ?????? ??? 1-3% ?? ????? ????? ???? ????. ???? ???? . ?????? ????? ???? ???? ??? ??? ?? ?? ???? ??????? ?????. . ?????? ? STIs ?? ????? ???????? ????. . ??? ???, ??? ?????? ??? ???????? ??????? ?? ??? ??? ????. . ????? ?? ????? ?? ???? ???? ???????? ????? ????. . ????? ??? ????, ?? ??????? ??? ??? ?????. . ???? ???? ????? ?????? ??? ?? ?????? ?2-21%. ?? ????? ???? ???? ????. ?????? ?? ?????? ????? . ?????? ?? ?????? ????? ??????? ????? ?????? ???? ????? ?????? ??? ??? ?????? ?????? ?? ???? ?????? ???? ????? ???? ??? ??? ?? ??? ???? ??? ?????? ???? ????? ??? ??????? (??? ?? ???? ????, ???? ???). Marland Kitchen. ??? ?????? ???? ????? ???? ????? ???? ?????? ??? ?? ???? IUD ???. ????? ???? ????? ??? . ???? ????? ???? ?? ????? ?????? ? 72 ???? ??? ???? ???? . ???? ???? ????? ????? ???? ???? 2% ?? ???? ??? ???? ? 6% ??? IUD . ???? (5) ??? ??? (? 28 ??? ??????? ?? 19 ?? 19 ??) ??? ????? ???? ?? ? 70 ?? ??? ????? ??? ?????. . ????? ??? ? 1% ??? ?? ????? ???? ????? ????  ????. ????? ???? . ?? ?? ?? ?????? ???? . ?? ?????? ?????? ????? ????. . ??? ????? '??????? ?????' ?? ????? ??? ??. . ??? ?? ?? ???? ???? ????????-?????? ?? ???? ???? ??????. . ????? ??? ?2-24%.  ?? ???? ???? ???? ????. ??? . ????? ?? ???? ????? ?? '??? ???????' ??? ???? ??? ??? ???? ??? ??? ????? ??? ???? ??? ??? ??? ???? ??? ??. . ???? ?? ?? ???? ??? ????? ???? ??? ????? ??? ????? ??? ??? ????? ??? ??????. . ?? ??? ?? ???? ????? ?? ?????. . ????? ??? 25%.   ???? . ???? ????? ?? ????? ??? ?? ???? ????? ?? ???? ????? ?? ??. . ???? ??? ????? ????? ??? ???? ??? ????. ??????? ???? ?? ?? ?????? ????? (??????) ???? ?? ?????. . ??? ????, ??????? ????. ????? ????? ????? ???? ??? ??? ???? ???? (vas deferens) ????, ???? ????? ??? ??? (???? ??? ???) ??? ??? ??? ??? ?????. ??? ???? ????? ?? ????? ??? ??? ???? ?????. . ????? ??? ? 1% ???. ?? tubal ligation ?? vasectomy ???? ????. ????? (????) . ???? (????) ?????? ?? ? STIs? ?????? ???? 100% ????? ???? ??. . ??? ??? ?????? ??? ?? ???? ?????? ????? ????? ????, ??? ??????? ??? ?????? ??? ????? ???? ??????. . ????? ???  0%  

## 2017-01-14 NOTE — Addendum Note (Signed)
Addended by: Jennet MaduroESTRIDGE, DENISE D on: 01/14/2017 12:09 PM   Modules accepted: Orders

## 2017-01-31 ENCOUNTER — Other Ambulatory Visit: Payer: Self-pay

## 2017-02-12 ENCOUNTER — Ambulatory Visit (INDEPENDENT_AMBULATORY_CARE_PROVIDER_SITE_OTHER): Payer: Self-pay | Admitting: Internal Medicine

## 2017-02-12 VITALS — BP 95/62 | HR 66 | Temp 98.0°F | Wt 141.0 lb

## 2017-02-12 DIAGNOSIS — N926 Irregular menstruation, unspecified: Secondary | ICD-10-CM

## 2017-02-12 DIAGNOSIS — B20 Human immunodeficiency virus [HIV] disease: Secondary | ICD-10-CM

## 2017-02-12 LAB — COMPLETE METABOLIC PANEL WITH GFR
ALT: 11 U/L (ref 6–29)
AST: 13 U/L (ref 10–30)
Albumin: 4.1 g/dL (ref 3.6–5.1)
Alkaline Phosphatase: 71 U/L (ref 33–115)
BILIRUBIN TOTAL: 0.4 mg/dL (ref 0.2–1.2)
BUN: 13 mg/dL (ref 7–25)
CHLORIDE: 106 mmol/L (ref 98–110)
CO2: 22 mmol/L (ref 20–31)
CREATININE: 0.58 mg/dL (ref 0.50–1.10)
Calcium: 9.1 mg/dL (ref 8.6–10.2)
GFR, Est African American: >89 mL/min (ref >=60)
GFR, Est Non African American: >89 mL/min (ref >=60)
GLUCOSE: 76 mg/dL (ref 65–99)
Potassium: 3.7 mmol/L (ref 3.5–5.3)
SODIUM: 137 mmol/L (ref 135–146)
TOTAL PROTEIN: 6.9 g/dL (ref 6.1–8.1)

## 2017-02-12 LAB — CBC WITH DIFFERENTIAL/PLATELET
BASOS PCT: 0 %
Basophils Absolute: 0 cells/uL (ref 0–200)
EOS PCT: 2 %
Eosinophils Absolute: 96 cells/uL (ref 15–500)
HCT: 40.1 % (ref 35.0–45.0)
Hemoglobin: 13.6 g/dL (ref 11.7–15.5)
LYMPHS PCT: 33 %
Lymphs Abs: 1584 cells/uL (ref 850–3900)
MCH: 31.1 pg (ref 27.0–33.0)
MCHC: 33.9 g/dL (ref 32.0–36.0)
MCV: 91.6 fL (ref 80.0–100.0)
MONOS PCT: 8 %
MPV: 9.9 fL (ref 7.5–12.5)
Monocytes Absolute: 384 cells/uL (ref 200–950)
NEUTROS ABS: 2736 {cells}/uL (ref 1500–7800)
Neutrophils Relative %: 57 %
PLATELETS: 191 10*3/uL (ref 140–400)
RBC: 4.38 MIL/uL (ref 3.80–5.10)
RDW: 13.2 % (ref 11.0–15.0)
WBC: 4.8 10*3/uL (ref 3.8–10.8)

## 2017-02-12 NOTE — Progress Notes (Signed)
Patient ID: Mary Zhang Drudge, female   DOB: 11/05/1990, 26 y.o.   MRN: [id number]  HPI 26yo F with hiv disease, cd 4 count of 390/VL<20 on biktarvy.Doing well. Baby is now 9 months. No longer having DUB. Not using OPC. Using condoms.   Received large bill doesn't know how to pay for it. No other complaints  Outpatient Encounter Prescriptions as of 02/12/2017  Medication Sig  . bictegravir-emtricitabine-tenofovir AF (BIKTARVY) 50-200-25 MG TABS tablet Take 1 tablet by mouth daily.  . Norgestimate-Ethinyl Estradiol Triphasic (TRI-SPRINTEC) 0.18/0.215/0.25 MG-35 MCG tablet Take 1 tablet by mouth daily. (Patient not taking: Reported on 12/26/2016)   No facility-administered encounter medications on file as of 02/12/2017.      Patient Active Problem List   Diagnosis Date Noted  . Supervision of high-risk pregnancy 04/05/2016  . Low blood pressure, not hypotension 01/02/2016  . Lead exposure 12/16/2015  . Raised antibody titer 10/25/2015  . HIV disease (HCC) 09/30/2015     There are no preventive care reminders to display for this patient.   Review of Systems  Constitutional: Negative for fever, chills, diaphoresis, activity change, appetite change, fatigue and unexpected weight change.  HENT: Negative for congestion, sore throat, rhinorrhea, sneezing, trouble swallowing and sinus pressure.  Eyes: Negative for photophobia and visual disturbance.  Respiratory: Negative for cough, chest tightness, shortness of breath, wheezing and stridor.  Cardiovascular: Negative for chest pain, palpitations and leg swelling.  Gastrointestinal: Negative for nausea, vomiting, abdominal pain, diarrhea, constipation, blood in stool, abdominal distention and anal bleeding.  Genitourinary: Negative for dysuria, hematuria, flank pain and difficulty urinating.  Musculoskeletal: Negative for myalgias, back pain, joint swelling, arthralgias and gait problem.  Skin: Negative for color change, pallor, rash and wound.   Neurological: Negative for dizziness, tremors, weakness and light-headedness.  Hematological: Negative for adenopathy. Does not bruise/bleed easily.  Psychiatric/Behavioral: Negative for behavioral problems, confusion, sleep disturbance, dysphoric mood, decreased concentration and agitation.    Physical Exam   BP 95/62   Pulse 66   Temp 98 F (36.7 C) (Oral)   Wt 141 lb (64 kg)   BMI 25.79 kg/m   Physical Exam  Constitutional:  oriented to person, place, and time. appears well-developed and well-nourished. No distress.  HENT: [place]/AT, PERRLA, no scleral icterus Mouth/Throat: Oropharynx is clear and moist. No oropharyngeal exudate.  Cardiovascular: Normal rate, regular rhythm and normal heart sounds. Exam reveals no gallop and no friction rub.  No murmur heard.  Pulmonary/Chest: Effort normal and breath sounds normal. No respiratory distress.  has no wheezes.  Lymphadenopathy: no cervical adenopathy. No axillary adenopathy Neurological: alert and oriented to person, place, and time.  Skin: Skin is warm and dry. No rash noted. No erythema.  Psychiatric: a normal mood and affect.  behavior is normal.   Lab Results  Component Value Date   CD4TCELL 27 (L) 09/26/2016   Lab Results  Component Value Date   CD4TABS 390 (L) 09/26/2016   CD4TABS 300 (L) 05/07/2016   CD4TABS 260 (L) 03/22/2016   Lab Results  Component Value Date   HIV1RNAQUANT <20 NOT DETECTED 09/26/2016   No results found for: HEPBSAB Lab Results  Component Value Date   LABRPR Nonreactive 12/14/2015    CBC Lab Results  Component Value Date   WBC 6.0 09/26/2016   RBC 4.50 09/26/2016   HGB 14.1 09/26/2016   HCT 40.9 09/26/2016   PLT 187 09/26/2016   MCV 90.9 09/26/2016   MCH 31.3 09/26/2016   MCHC 34.5 09/26/2016  RDW 13.5 09/26/2016   LYMPHSABS 1,560 09/26/2016   MONOABS 360 09/26/2016   EOSABS 120 09/26/2016    BMET Lab Results  Component Value Date   NA 141 09/26/2016   K 3.6 09/26/2016   CL  110 09/26/2016   CO2 23 09/26/2016   GLUCOSE 117 (H) 09/26/2016   BUN 7 09/26/2016   CREATININE 0.59 09/26/2016   CALCIUM 9.1 09/26/2016   GFRNONAA >89 09/26/2016   GFRAA >89 09/26/2016      Assessment and Plan  hiv disease = doing well with adherence. Will check labs since she has been switched to [employer] planning = currently using condoms. Has stopped otc  Abn uterine bleeding = now her menstrual cycle appears to be more consistent

## 2017-02-14 LAB — T-HELPER CELL (CD4) - (RCID CLINIC ONLY)
CD4 % Helper T Cell: 31 % — ABNORMAL LOW (ref 33–55)
CD4 T CELL ABS: 530 /uL (ref 400–2700)

## 2017-02-15 LAB — HIV-1 RNA QUANT-NO REFLEX-BLD
HIV 1 RNA QUANT: NOT DETECTED {copies}/mL
HIV-1 RNA QUANT, LOG: NOT DETECTED {Log_copies}/mL

## 2017-02-19 ENCOUNTER — Ambulatory Visit: Payer: Self-pay | Admitting: Internal Medicine

## 2017-02-19 ENCOUNTER — Ambulatory Visit (HOSPITAL_COMMUNITY)
Admission: RE | Admit: 2017-02-19 | Discharge: 2017-02-19 | Disposition: A | Discharge status: Home / Self Care | Payer: Self-pay | Source: Ambulatory Visit | Attending: Obstetrics and Gynecology | Admitting: Obstetrics and Gynecology

## 2017-02-19 ENCOUNTER — Telehealth: Payer: Self-pay | Admitting: General Practice

## 2017-02-19 DIAGNOSIS — N83201 Unspecified ovarian cyst, right side: Secondary | ICD-10-CM

## 2017-02-19 DIAGNOSIS — Z8742 Personal history of other diseases of the female genital tract: Secondary | ICD-10-CM | POA: Insufficient documentation

## 2017-02-19 NOTE — Telephone Encounter (Signed)
-----   Message from Catalina AntiguaPeggy Constant, MD sent at 02/19/2017  2:02 PM EDT ----- Please inform patient of complete resolution of ovarian cyst  Thanks  Gigi GinPeggy

## 2017-02-19 NOTE — Telephone Encounter (Signed)
Called patient with [facility] interpreter [phone] and informed her of results. Patient verbalized understanding & had no questions

## 2017-02-28 ENCOUNTER — Encounter: Payer: Self-pay | Admitting: Internal Medicine

## 2017-05-28 ENCOUNTER — Ambulatory Visit (INDEPENDENT_AMBULATORY_CARE_PROVIDER_SITE_OTHER): Payer: Self-pay | Admitting: Internal Medicine

## 2017-05-28 VITALS — BP 94/63 | HR 72 | Temp 97.9°F | Wt 141.0 lb

## 2017-05-28 DIAGNOSIS — B20 Human immunodeficiency virus [HIV] disease: Secondary | ICD-10-CM

## 2017-05-28 DIAGNOSIS — Z23 Encounter for immunization: Secondary | ICD-10-CM

## 2017-05-28 LAB — COMPLETE METABOLIC PANEL WITH GFR
AG Ratio: 1.3 (calc) (ref 1.0–2.5)
ALKALINE PHOSPHATASE (APISO): 60 U/L (ref 33–115)
ALT: 12 U/L (ref 6–29)
AST: 13 U/L (ref 10–30)
Albumin: 4.1 g/dL (ref 3.6–5.1)
BILIRUBIN TOTAL: 0.5 mg/dL (ref 0.2–1.2)
BUN: 9 mg/dL (ref 7–25)
CHLORIDE: 105 mmol/L (ref 98–110)
CO2: 27 mmol/L (ref 20–32)
Calcium: 9.3 mg/dL (ref 8.6–10.2)
Creat: 0.58 mg/dL (ref 0.50–1.10)
GFR, Est African American: 150 mL/min/{1.73_m2} (ref >=60)
GFR, Est Non African American: 129 mL/min/{1.73_m2} (ref >=60)
GLUCOSE: 82 mg/dL (ref 65–99)
Globulin: 3.1 g/dL (calc) (ref 1.9–3.7)
POTASSIUM: 3.7 mmol/L (ref 3.5–5.3)
Sodium: 137 mmol/L (ref 135–146)
Total Protein: 7.2 g/dL (ref 6.1–8.1)

## 2017-05-28 LAB — CBC WITH DIFFERENTIAL/PLATELET
BASOS ABS: 0 {cells}/uL (ref 0–200)
Basophils Relative: 0 %
EOS ABS: 122 {cells}/uL (ref 15–500)
Eosinophils Relative: 2.1 %
HCT: 38.6 % (ref 35.0–45.0)
Hemoglobin: 13.4 g/dL (ref 11.7–15.5)
Lymphs Abs: 1508 cells/uL (ref 850–3900)
MCH: 30.4 pg (ref 27.0–33.0)
MCHC: 34.7 g/dL (ref 32.0–36.0)
MCV: 87.5 fL (ref 80.0–100.0)
MONOS PCT: 8.7 %
MPV: 11 fL (ref 7.5–12.5)
Neutro Abs: 3666 cells/uL (ref 1500–7800)
Neutrophils Relative %: 63.2 %
PLATELETS: 199 10*3/uL (ref 140–400)
RBC: 4.41 10*6/uL (ref 3.80–5.10)
RDW: 12.7 % (ref 11.0–15.0)
TOTAL LYMPHOCYTE: 26 %
WBC mixed population: 505 cells/uL (ref 200–950)
WBC: 5.8 10*3/uL (ref 3.8–10.8)

## 2017-05-28 NOTE — Progress Notes (Signed)
RFV: follow up for hiv disease  Patient ID: Mary Zhang, female   DOB: 09/21/1990, 26 y.o.   MRN: [id number]  HPI  26yo F with well controlled hiv disease, Cd 4 count of 530/VL<20, on biktarvy. She is doing well with her health. No recent illnesses. She reports that she would like to have another child with her husband.  Outpatient Encounter Medications as of 05/28/2017  Medication Sig  . bictegravir-emtricitabine-tenofovir AF (BIKTARVY) 50-200-25 MG TABS tablet Take 1 tablet by mouth daily.   No facility-administered encounter medications on file as of 05/28/2017.      Patient Active Problem List   Diagnosis Date Noted  . Supervision of high-risk pregnancy 04/05/2016  . Low blood pressure, not hypotension 01/02/2016  . Lead exposure 12/16/2015  . Raised antibody titer 10/25/2015  . HIV disease (HCC) 09/30/2015     Health Maintenance Due  Topic Date Due  . INFLUENZA VACCINE  02/20/2017     Review of Systems  Constitutional: Negative for fever, chills, diaphoresis, activity change, appetite change, fatigue and unexpected weight change.  HENT: Negative for congestion, sore throat, rhinorrhea, sneezing, trouble swallowing and sinus pressure.  Eyes: Negative for photophobia and visual disturbance.  Respiratory: Negative for cough, chest tightness, shortness of breath, wheezing and stridor.  Cardiovascular: Negative for chest pain, palpitations and leg swelling.  Gastrointestinal: Negative for nausea, vomiting, abdominal pain, diarrhea, constipation, blood in stool, abdominal distention and anal bleeding.  Genitourinary: Negative for dysuria, hematuria, flank pain and difficulty urinating.  Musculoskeletal: Negative for myalgias, back pain, joint swelling, arthralgias and gait problem.  Skin: Negative for color change, pallor, rash and wound.  Neurological: Negative for dizziness, tremors, weakness and light-headedness.  Hematological: Negative for adenopathy. Does not bruise/bleed  easily.  Psychiatric/Behavioral: Negative for behavioral problems, confusion, sleep disturbance, dysphoric mood, decreased concentration and agitation.    Physical Exam   BP 94/63   Pulse 72   Temp 97.9 F (36.6 C) (Oral)   Wt 141 lb (64 kg)   BMI 25.79 kg/m   Physical Exam  Constitutional:  oriented to person, place, and time. appears well-developed and well-nourished. No distress.  HENT: [place]/AT, PERRLA, no scleral icterus Mouth/Throat: Oropharynx is clear and moist. No oropharyngeal exudate.  Cardiovascular: Normal rate, regular rhythm and normal heart sounds. Exam reveals no gallop and no friction rub.  No murmur heard.  Pulmonary/Chest: Effort normal and breath sounds normal. No respiratory distress.  has no wheezes.  Neck = supple, no nuchal rigidity Abdominal: Soft. Bowel sounds are normal.  exhibits no distension. There is no tenderness.  Lymphadenopathy: no cervical adenopathy. No axillary adenopathy Neurological: alert and oriented to person, place, and time.  Skin: Skin is warm and dry. No rash noted. No erythema.  Psychiatric: a normal mood and affect.  behavior is normal.   Lab Results  Component Value Date   CD4TCELL 31 (L) 02/12/2017   Lab Results  Component Value Date   CD4TABS 530 02/12/2017   CD4TABS 390 (L) 09/26/2016   CD4TABS 300 (L) 05/07/2016   Lab Results  Component Value Date   HIV1RNAQUANT <20 NOT DETECTED 02/12/2017   No results found for: HEPBSAB Lab Results  Component Value Date   LABRPR Nonreactive 12/14/2015    CBC Lab Results  Component Value Date   WBC 4.8 02/12/2017   RBC 4.38 02/12/2017   HGB 13.6 02/12/2017   HCT 40.1 02/12/2017   PLT 191 02/12/2017   MCV 91.6 02/12/2017   MCH 31.1 02/12/2017  MCHC 33.9 02/12/2017   RDW 13.2 02/12/2017   LYMPHSABS 1,584 02/12/2017   MONOABS 384 02/12/2017   EOSABS 96 02/12/2017    BMET Lab Results  Component Value Date   NA 137 02/12/2017   K 3.7 02/12/2017   CL 106 02/12/2017    CO2 22 02/12/2017   GLUCOSE 76 02/12/2017   BUN 13 02/12/2017   CREATININE 0.58 02/12/2017   CALCIUM 9.1 02/12/2017   GFRNONAA >89 02/12/2017   GFRAA >89 02/12/2017      Assessment and Plan  HIV disease = continue on current regimen  Birth planning = recommended to start prenatal vitamin. May need to change off of integrase inhibitor  Health maintenance = will give influenza and pneumococcal regimen

## 2017-05-28 NOTE — Patient Instructions (Signed)
   If you would like to try to become pregnant,  Please start taking pre-natal vitamin ( can find at local pharmacy without prescription)

## 2017-05-29 LAB — T-HELPER CELL (CD4) - (RCID CLINIC ONLY)
CD4 % Helper T Cell: 30 % — ABNORMAL LOW (ref 33–55)
CD4 T Cell Abs: 440 /uL (ref 400–2700)

## 2017-05-30 LAB — HIV-1 RNA QUANT-NO REFLEX-BLD
HIV 1 RNA Quant: <20 copies/mL
HIV-1 RNA Quant, Log: <1.3 Log copies/mL

## 2017-09-02 ENCOUNTER — Ambulatory Visit: Payer: Self-pay | Admitting: Internal Medicine

## 2017-10-02 ENCOUNTER — Ambulatory Visit (INDEPENDENT_AMBULATORY_CARE_PROVIDER_SITE_OTHER): Payer: Self-pay | Admitting: Internal Medicine

## 2017-10-02 ENCOUNTER — Encounter: Payer: Self-pay | Admitting: Internal Medicine

## 2017-10-02 VITALS — BP 96/62 | HR 78 | Temp 97.8°F | Wt 136.7 lb

## 2017-10-02 DIAGNOSIS — B20 Human immunodeficiency virus [HIV] disease: Secondary | ICD-10-CM

## 2017-10-02 NOTE — Patient Instructions (Signed)
   Can get any medication at the pharmacy that says "pre-natal vitamin" take at dinner time   Can also try "pre-natal gummies"   Any store like [facility], [facility], .[employer] [employer] [employer]..[employer] [employer]

## 2017-10-02 NOTE — Progress Notes (Signed)
RFV: follow up for hiv disease  Patient ID: Mary Zhang, female   DOB: 06/29/1991, 27 y.o.   MRN: [id number]  HPI 27yo F with well controlled hiv disease, cd 4 count 440/<20, on biktarvy. She reports taking medications daily without difficulty. She is still having her menses come regularly despite her and her husband trying to get pregnant. Her daughter, 12-15 mo, is in good health.  No recent illnesses.  Needs adap re application for coverage of medications  Outpatient Encounter Medications as of 10/02/2017  Medication Sig  . bictegravir-emtricitabine-tenofovir AF (BIKTARVY) 50-200-25 MG TABS tablet Take 1 tablet by mouth daily.   No facility-administered encounter medications on file as of 10/02/2017.      Patient Active Problem List   Diagnosis Date Noted  . Supervision of high-risk pregnancy 04/05/2016  . Low blood pressure, not hypotension 01/02/2016  . Lead exposure 12/16/2015  . Raised antibody titer 10/25/2015  . HIV disease (HCC) 09/30/2015     There are no preventive care reminders to display for this patient.   Review of Systems  Constitutional: Negative for fever, chills, diaphoresis, activity change, appetite change, fatigue and unexpected weight change.  HENT: Negative for congestion, sore throat, rhinorrhea, sneezing, trouble swallowing and sinus pressure.  Eyes: Negative for photophobia and visual disturbance.  Respiratory: Negative for cough, chest tightness, shortness of breath, wheezing and stridor.  Cardiovascular: Negative for chest pain, palpitations and leg swelling.  Gastrointestinal: Negative for nausea, vomiting, abdominal pain, diarrhea, constipation, blood in stool, abdominal distention and anal bleeding.  Genitourinary: Negative for dysuria, hematuria, flank pain and difficulty urinating.  Musculoskeletal: Negative for myalgias, back pain, joint swelling, arthralgias and gait problem.  Skin: Negative for color change, pallor, rash and wound.    Neurological: Negative for dizziness, tremors, weakness and light-headedness.  Hematological: Negative for adenopathy. Does not bruise/bleed easily.  Psychiatric/Behavioral: Negative for behavioral problems, confusion, sleep disturbance, dysphoric mood, decreased concentration and agitation.    Physical Exam   BP 96/62   Pulse 78   Temp 97.8 F (36.6 C) (Oral)   Wt 136 lb 11 oz (62 kg)   BMI 25.00 kg/m   Physical Exam  Constitutional:  oriented to person, place, and time. appears well-developed and well-nourished. No distress.  HENT: [place]/AT, PERRLA, no scleral icterus Mouth/Throat: Oropharynx is clear and moist. No oropharyngeal exudate.  Cardiovascular: Normal rate, regular rhythm and normal heart sounds. Exam reveals no gallop and no friction rub.  No murmur heard.  Pulmonary/Chest: Effort normal and breath sounds normal. No respiratory distress.  has no wheezes.  Neck = supple, no nuchal rigidity Abdominal: Soft. Bowel sounds are normal.  exhibits no distension. There is no tenderness.  Lymphadenopathy: no cervical adenopathy. No axillary adenopathy Neurological: alert and oriented to person, place, and time.  Skin: Skin is warm and dry. No rash noted. No erythema.  Psychiatric: a normal mood and affect.  behavior is normal.   Lab Results  Component Value Date   CD4TCELL 30 (L) 05/28/2017   Lab Results  Component Value Date   CD4TABS 440 05/28/2017   CD4TABS 530 02/12/2017   CD4TABS 390 (L) 09/26/2016   Lab Results  Component Value Date   HIV1RNAQUANT <20 NOT DETECTED 05/28/2017   No results found for: HEPBSAB Lab Results  Component Value Date   LABRPR Nonreactive 12/14/2015    CBC Lab Results  Component Value Date   WBC 5.8 05/28/2017   RBC 4.41 05/28/2017   HGB 13.4 05/28/2017   HCT 38.6  05/28/2017   PLT 199 05/28/2017   MCV 87.5 05/28/2017   MCH 30.4 05/28/2017   MCHC 34.7 05/28/2017   RDW 12.7 05/28/2017   LYMPHSABS 1,508 05/28/2017   MONOABS 384  02/12/2017   EOSABS 122 05/28/2017    BMET Lab Results  Component Value Date   NA 137 05/28/2017   K 3.7 05/28/2017   CL 105 05/28/2017   CO2 27 05/28/2017   GLUCOSE 82 05/28/2017   BUN 9 05/28/2017   CREATININE 0.58 05/28/2017   CALCIUM 9.3 05/28/2017   GFRNONAA 129 05/28/2017   GFRAA 150 05/28/2017      Assessment and Plan hiv disease = will check viral load, but has been undetectable. Continue with biktarvy  Access to meds will have them meet with counselor to re apply for adap  Pre-pregnancy counseling = have her start taking pre natal vitamins to take a different time of day than her biktarvy  Health maintenance = will get pcv 13 at next visit

## 2017-10-03 ENCOUNTER — Encounter: Payer: Self-pay | Admitting: Internal Medicine

## 2017-10-03 LAB — CBC WITH DIFFERENTIAL/PLATELET
BASOS PCT: 0.2 %
Basophils Absolute: 10 cells/uL (ref 0–200)
EOS PCT: 1.3 %
Eosinophils Absolute: 68 cells/uL (ref 15–500)
HCT: 38.8 % (ref 35.0–45.0)
Hemoglobin: 13.6 g/dL (ref 11.7–15.5)
Lymphs Abs: 1472 cells/uL (ref 850–3900)
MCH: 30.4 pg (ref 27.0–33.0)
MCHC: 35.1 g/dL (ref 32.0–36.0)
MCV: 86.8 fL (ref 80.0–100.0)
MONOS PCT: 6.2 %
MPV: 11 fL (ref 7.5–12.5)
Neutro Abs: 3328 cells/uL (ref 1500–7800)
Neutrophils Relative %: 64 %
PLATELETS: 226 10*3/uL (ref 140–400)
RBC: 4.47 10*6/uL (ref 3.80–5.10)
RDW: 13 % (ref 11.0–15.0)
TOTAL LYMPHOCYTE: 28.3 %
WBC mixed population: 322 cells/uL (ref 200–950)
WBC: 5.2 10*3/uL (ref 3.8–10.8)

## 2017-10-03 LAB — COMPLETE METABOLIC PANEL WITH GFR
AG RATIO: 1.3 (calc) (ref 1.0–2.5)
ALBUMIN MSPROF: 4.2 g/dL (ref 3.6–5.1)
ALKALINE PHOSPHATASE (APISO): 61 U/L (ref 33–115)
ALT: 13 U/L (ref 6–29)
AST: 16 U/L (ref 10–30)
BILIRUBIN TOTAL: 0.9 mg/dL (ref 0.2–1.2)
BUN: 7 mg/dL (ref 7–25)
CHLORIDE: 105 mmol/L (ref 98–110)
CO2: 28 mmol/L (ref 20–32)
Calcium: 9.3 mg/dL (ref 8.6–10.2)
Creat: 0.54 mg/dL (ref 0.50–1.10)
GFR, Est African American: 152 mL/min/{1.73_m2} (ref >=60)
GFR, Est Non African American: 131 mL/min/{1.73_m2} (ref >=60)
GLUCOSE: 93 mg/dL (ref 65–99)
Globulin: 3.2 g/dL (calc) (ref 1.9–3.7)
POTASSIUM: 4 mmol/L (ref 3.5–5.3)
Sodium: 138 mmol/L (ref 135–146)
Total Protein: 7.4 g/dL (ref 6.1–8.1)

## 2017-10-04 LAB — T-HELPER CELL (CD4) - (RCID CLINIC ONLY)
CD4 % Helper T Cell: 36 % (ref 33–55)
CD4 T CELL ABS: 530 /uL (ref 400–2700)

## 2017-10-08 LAB — HIV-1 RNA QUANT-NO REFLEX-BLD
HIV 1 RNA Quant: <20 copies/mL — AB
HIV-1 RNA QUANT, LOG: DETECTED {Log_copies}/mL — AB

## 2017-12-03 ENCOUNTER — Other Ambulatory Visit: Payer: Self-pay | Admitting: Internal Medicine

## 2017-12-31 ENCOUNTER — Encounter: Payer: Self-pay | Admitting: Internal Medicine

## 2017-12-31 ENCOUNTER — Ambulatory Visit (INDEPENDENT_AMBULATORY_CARE_PROVIDER_SITE_OTHER): Payer: Self-pay | Admitting: Internal Medicine

## 2017-12-31 VITALS — BP 124/74 | HR 72 | Temp 98.6°F | Wt 143.0 lb

## 2017-12-31 DIAGNOSIS — B20 Human immunodeficiency virus [HIV] disease: Secondary | ICD-10-CM

## 2017-12-31 DIAGNOSIS — Z79899 Other long term (current) drug therapy: Secondary | ICD-10-CM

## 2017-12-31 DIAGNOSIS — N926 Irregular menstruation, unspecified: Secondary | ICD-10-CM

## 2017-12-31 LAB — POCT URINE PREGNANCY: Preg Test, Ur: NEGATIVE

## 2017-12-31 NOTE — Progress Notes (Signed)
RFV: follow up for hiv disease  Patient ID: Mary Zhang, female   DOB: 06-27-1991, 27 y.o.   MRN: [id number]  HPI  Mary Zhang is a 27yo F with hiv disease, CD 4 count of 530/VL<20 in march 2019 on biktarvy. Also on vitamins. Had her period last month, she reports last had it on 20th of last month but patient reported initially no menses x 3 months. She is otherwise doing well. No recent illness. No missing doses of meds. She is here with her husband.  Outpatient Encounter Medications as of 12/31/2017  Medication Sig  . BIKTARVY 50-200-25 MG TABS tablet TAKE 1 TABLET BY MOUTH DAILY   No facility-administered encounter medications on file as of 12/31/2017.      Patient Active Problem List   Diagnosis Date Noted  . Supervision of high-risk pregnancy 04/05/2016  . Low blood pressure, not hypotension 01/02/2016  . Lead exposure 12/16/2015  . Raised antibody titer 10/25/2015  . HIV disease (HCC) 09/30/2015   Social History   Tobacco Use  . Smoking status: Never Smoker  . Smokeless tobacco: Never Used  Substance Use Topics  . Alcohol use: No  . Drug use: No    There are no preventive care reminders to display for this patient.   Review of Systems  Constitutional: Negative for fever, chills, diaphoresis, activity change, appetite change, fatigue and unexpected weight change.  HENT: Negative for congestion, sore throat, rhinorrhea, sneezing, trouble swallowing and sinus pressure.  Eyes: Negative for photophobia and visual disturbance.  Respiratory: Negative for cough, chest tightness, shortness of breath, wheezing and stridor.  Cardiovascular: Negative for chest pain, palpitations and leg swelling.  Gastrointestinal: Negative for nausea, vomiting, abdominal pain, diarrhea, constipation, blood in stool, abdominal distention and anal bleeding.  Genitourinary: Negative for dysuria, hematuria, flank pain and difficulty urinating.  Musculoskeletal: Negative for myalgias, back pain, joint  swelling, arthralgias and gait problem.  Skin: Negative for color change, pallor, rash and wound.  Neurological: Negative for dizziness, tremors, weakness and light-headedness.  Hematological: Negative for adenopathy. Does not bruise/bleed easily.  Psychiatric/Behavioral: Negative for behavioral problems, confusion, sleep disturbance, dysphoric mood, decreased concentration and agitation.    Physical Exam  BP 124/74   Pulse 72   Temp 98.6 F (37 C) (Oral)   Wt 143 lb (64.9 kg)   BMI 26.16 kg/m  Physical Exam  Constitutional:  oriented to person, place, and time. appears well-developed and well-nourished. No distress.  HENT: /AT, PERRLA, no scleral icterus Mouth/Throat: Oropharynx is clear and moist. No oropharyngeal exudate.  Cardiovascular: Normal rate, regular rhythm and normal heart sounds. Exam reveals no gallop and no friction rub.  No murmur heard.  Pulmonary/Chest: Effort normal and breath sounds normal. No respiratory distress.  has no wheezes.  Neck = supple, no nuchal rigidity Lymphadenopathy: no cervical adenopathy. No axillary adenopathy Neurological: alert and oriented to person, place, and time.  Skin: Skin is warm and dry. No rash noted. No erythema.  Psychiatric: a normal mood and affect.  behavior is normal.    Lab Results  Component Value Date   CD4TCELL 36 10/02/2017   Lab Results  Component Value Date   CD4TABS 530 10/02/2017   CD4TABS 440 05/28/2017   CD4TABS 530 02/12/2017   Lab Results  Component Value Date   HIV1RNAQUANT <20 DETECTED (A) 10/02/2017   No results found for: HEPBSAB Lab Results  Component Value Date   LABRPR Nonreactive 12/14/2015    CBC Lab Results  Component Value Date  WBC 5.2 10/02/2017   RBC 4.47 10/02/2017   HGB 13.6 10/02/2017   HCT 38.8 10/02/2017   PLT 226 10/02/2017   MCV 86.8 10/02/2017   MCH 30.4 10/02/2017   MCHC 35.1 10/02/2017   RDW 13.0 10/02/2017   LYMPHSABS 1,472 10/02/2017   MONOABS 384  02/12/2017   EOSABS 68 10/02/2017    BMET Lab Results  Component Value Date   NA 138 10/02/2017   K 4.0 10/02/2017   CL 105 10/02/2017   CO2 28 10/02/2017   GLUCOSE 93 10/02/2017   BUN 7 10/02/2017   CREATININE 0.54 10/02/2017   CALCIUM 9.3 10/02/2017   GFRNONAA 131 10/02/2017   GFRAA 152 10/02/2017      Assessment and Plan  HIV disease = well controlled, continue on current regimen  Long term medication management = cr function appears stable.  Health maintenance = prevnar 13 due in after September  ? Amenorrhea = will check ua preg

## 2018-04-29 ENCOUNTER — Ambulatory Visit: Payer: Self-pay

## 2018-05-01 ENCOUNTER — Encounter: Payer: Self-pay | Admitting: Internal Medicine

## 2018-06-02 ENCOUNTER — Other Ambulatory Visit: Payer: Self-pay | Admitting: Internal Medicine

## 2018-07-23 NOTE — L&D Delivery Note (Addendum)
[facility] Delivery Note  Kateena Yost is a 28 y.o. G2P0101 s/p SVD at [redacted]w[redacted]d. She was admitted for SROM.   ROM: 15h 11m with clear fluid GBS Status:  Negative/-- (12/11 0829) Maximum Maternal Temperature: 98.8   Labor Progress: Patient arrived at 0.5 cm dilation and was induced with 2 doses of cytotec. One hour after 2nd dose, pt was noted to be 5/100/-2.  Within 20 minutes, she progressed to complete and pushing. TXA was started IV.  Delivery Date/Time: 07/09/2019 at 2141 Delivery: Head delivered in LOA position. Loose nuchal cord present, reduced at the perineum. Shoulder and body delivered in usual fashion. Infant with spontaneous cry, placed on mother's abdomen, dried and stimulated. Cord clamped x 2 after 1-minute delay, and cut by father of baby. Cord blood drawn. 40 units of pitocin diluted in 1000cc LR was infused rapidly IV.  Placenta delivered spontaneously with gentle cord traction. Fundus firm with massage and Pitocin. Labia, perineum, vagina, and cervix inspected with hemostatic vaginal wall tears bilaterally which did not require repair..   Placenta: spontaneous, intact, 3 vessel cord Complications: none Lacerations: bil vaginal side wall tears, hemostatic w/o intervention EBL: 120cc Analgesia: none   Infant: APGAR (1 MIN): 7   APGAR (5 MINS): 9    Weight: pending  Demetrius Revel, MD PGY-3   The above was performed under my direct supervision and guidance.

## 2018-07-31 ENCOUNTER — Other Ambulatory Visit: Payer: Self-pay | Admitting: Internal Medicine

## 2018-08-20 ENCOUNTER — Other Ambulatory Visit: Payer: Self-pay | Admitting: Behavioral Health

## 2018-08-20 MED ORDER — BICTEGRAVIR-EMTRICITAB-TENOFOV 50-200-25 MG PO TABS: 1.0000 | ORAL_TABLET | Freq: Every day | ORAL | 0 refills | Status: DC

## 2018-08-26 ENCOUNTER — Ambulatory Visit: Payer: Self-pay

## 2018-08-26 ENCOUNTER — Other Ambulatory Visit: Payer: Self-pay | Admitting: *Deleted

## 2018-08-26 ENCOUNTER — Encounter: Payer: Self-pay | Admitting: Internal Medicine

## 2018-08-26 ENCOUNTER — Other Ambulatory Visit: Payer: Self-pay

## 2018-08-26 DIAGNOSIS — Z113 Encounter for screening for infections with a predominantly sexual mode of transmission: Secondary | ICD-10-CM

## 2018-08-26 DIAGNOSIS — B20 Human immunodeficiency virus [HIV] disease: Secondary | ICD-10-CM

## 2018-08-26 DIAGNOSIS — Z79899 Other long term (current) drug therapy: Secondary | ICD-10-CM

## 2018-08-27 LAB — T-HELPER CELL (CD4) - (RCID CLINIC ONLY)
CD4 % Helper T Cell: 33 % (ref 33–55)
CD4 T Cell Abs: 460 /uL (ref 400–2700)

## 2018-08-27 LAB — URINE CYTOLOGY ANCILLARY ONLY
Chlamydia: NEGATIVE
Neisseria Gonorrhea: NEGATIVE

## 2018-08-28 LAB — COMPLETE METABOLIC PANEL WITH GFR
AG Ratio: 1.5 (calc) (ref 1.0–2.5)
ALT: 11 U/L (ref 6–29)
AST: 14 U/L (ref 10–30)
Albumin: 4.3 g/dL (ref 3.6–5.1)
Alkaline phosphatase (APISO): 46 U/L (ref 31–125)
BUN: 11 mg/dL (ref 7–25)
CHLORIDE: 104 mmol/L (ref 98–110)
CO2: 27 mmol/L (ref 20–32)
Calcium: 9.6 mg/dL (ref 8.6–10.2)
Creat: 0.91 mg/dL (ref 0.50–1.10)
GFR, Est African American: 102 mL/min/{1.73_m2} (ref >=60)
GFR, Est Non African American: 88 mL/min/{1.73_m2} (ref >=60)
Globulin: 2.9 g/dL (calc) (ref 1.9–3.7)
Glucose, Bld: 92 mg/dL (ref 65–99)
Potassium: 3.9 mmol/L (ref 3.5–5.3)
Sodium: 138 mmol/L (ref 135–146)
Total Bilirubin: 0.6 mg/dL (ref 0.2–1.2)
Total Protein: 7.2 g/dL (ref 6.1–8.1)

## 2018-08-28 LAB — LIPID PANEL
CHOLESTEROL: 122 mg/dL (ref <200)
HDL: 42 mg/dL — ABNORMAL LOW (ref >=50)
LDL Cholesterol (Calc): 63 mg/dL (calc)
Non-HDL Cholesterol (Calc): 80 mg/dL (calc) (ref <130)
Total CHOL/HDL Ratio: 2.9 (calc) (ref <5.0)
Triglycerides: 89 mg/dL (ref <150)

## 2018-08-28 LAB — CBC WITH DIFFERENTIAL/PLATELET
Absolute Monocytes: 301 cells/uL (ref 200–950)
Basophils Absolute: 12 cells/uL (ref 0–200)
Basophils Relative: 0.2 %
Eosinophils Absolute: 47 cells/uL (ref 15–500)
Eosinophils Relative: 0.8 %
HCT: 39.9 % (ref 35.0–45.0)
Hemoglobin: 14 g/dL (ref 11.7–15.5)
Lymphs Abs: 1375 cells/uL (ref 850–3900)
MCH: 31.3 pg (ref 27.0–33.0)
MCHC: 35.1 g/dL (ref 32.0–36.0)
MCV: 89.1 fL (ref 80.0–100.0)
MPV: 11 fL (ref 7.5–12.5)
Monocytes Relative: 5.1 %
Neutro Abs: 4165 cells/uL (ref 1500–7800)
Neutrophils Relative %: 70.6 %
Platelets: 203 10*3/uL (ref 140–400)
RBC: 4.48 10*6/uL (ref 3.80–5.10)
RDW: 12.7 % (ref 11.0–15.0)
TOTAL LYMPHOCYTE: 23.3 %
WBC: 5.9 10*3/uL (ref 3.8–10.8)

## 2018-08-28 LAB — RPR: RPR: NONREACTIVE

## 2018-08-28 LAB — HIV-1 RNA QUANT-NO REFLEX-BLD
HIV 1 RNA Quant: <20 copies/mL
HIV-1 RNA Quant, Log: <1.3 Log copies/mL

## 2018-09-04 ENCOUNTER — Encounter: Payer: Self-pay | Admitting: Internal Medicine

## 2018-09-22 ENCOUNTER — Other Ambulatory Visit: Payer: Self-pay | Admitting: Internal Medicine

## 2018-09-29 ENCOUNTER — Other Ambulatory Visit: Payer: Self-pay | Admitting: Pharmacist

## 2018-09-29 ENCOUNTER — Encounter: Payer: Self-pay | Admitting: Internal Medicine

## 2018-09-29 ENCOUNTER — Ambulatory Visit (INDEPENDENT_AMBULATORY_CARE_PROVIDER_SITE_OTHER): Payer: Self-pay | Admitting: Internal Medicine

## 2018-09-29 VITALS — BP 103/72 | HR 93 | Temp 98.2°F | Wt 150.0 lb

## 2018-09-29 DIAGNOSIS — Z23 Encounter for immunization: Secondary | ICD-10-CM

## 2018-09-29 DIAGNOSIS — Z79899 Other long term (current) drug therapy: Secondary | ICD-10-CM

## 2018-09-29 DIAGNOSIS — B20 Human immunodeficiency virus [HIV] disease: Secondary | ICD-10-CM

## 2018-09-29 MED ORDER — DARUN-COBIC-EMTRICIT-TENOFAF 800-150-200-10 MG PO TABS: 1.0000 | ORAL_TABLET | Freq: Every day | ORAL | 11 refills | Status: DC

## 2018-09-29 NOTE — Progress Notes (Signed)
RFV: follow up for hiv disease  Patient ID: Mary Zhang, female   DOB: 09/23/1990, 28 y.o.   MRN: [id number]  HPI Mary Zhang is a 28yo F with hiv disease, well controlled on hiv disease. She has a young toddler doing well. She reports excellent adherence. She mentions that they are thinking of having another child.  Outpatient Encounter Medications as of 09/29/2018  Medication Sig  . BIKTARVY 50-200-25 MG TABS tablet TAKE 1 TABLET BY MOUTH DAILY  . BIKTARVY 50-200-25 MG TABS tablet TAKE 1 TABLET BY MOUTH DAILY   No facility-administered encounter medications on file as of 09/29/2018.      Patient Active Problem List   Diagnosis Date Noted  . Supervision of high-risk pregnancy 04/05/2016  . Low blood pressure, not hypotension 01/02/2016  . Lead exposure 12/16/2015  . Raised antibody titer 10/25/2015  . HIV disease (HCC) 09/30/2015     Health Maintenance Due  Topic Date Due  . INFLUENZA VACCINE  02/20/2018  . PAP SMEAR-Modifier  09/30/2018     Review of Systems Review of Systems  Constitutional: Negative for fever, chills, diaphoresis, activity change, appetite change, fatigue and unexpected weight change.  HENT: Negative for congestion, sore throat, rhinorrhea, sneezing, trouble swallowing and sinus pressure.  Eyes: Negative for photophobia and visual disturbance.  Respiratory: Negative for cough, chest tightness, shortness of breath, wheezing and stridor.  Cardiovascular: Negative for chest pain, palpitations and leg swelling.  Gastrointestinal: Negative for nausea, vomiting, abdominal pain, diarrhea, constipation, blood in stool, abdominal distention and anal bleeding.  Genitourinary: Negative for dysuria, hematuria, flank pain and difficulty urinating.  Musculoskeletal: Negative for myalgias, back pain, joint swelling, arthralgias and gait problem.  Skin: Negative for color change, pallor, rash and wound.  Neurological: Negative for dizziness, tremors, weakness and  light-headedness.  Hematological: Negative for adenopathy. Does not bruise/bleed easily.  Psychiatric/Behavioral: Negative for behavioral problems, confusion, sleep disturbance, dysphoric mood, decreased concentration and agitation.    Physical Exam   There were no vitals taken for this visit.  Physical Exam  Constitutional:  oriented to person, place, and time. appears well-developed and well-nourished. No distress.  HENT: Bolt/AT, PERRLA, no scleral icterus Mouth/Throat: Oropharynx is clear and moist. No oropharyngeal exudate.  Cardiovascular: Normal rate, regular rhythm and normal heart sounds. Exam reveals no gallop and no friction rub.  No murmur heard.  Pulmonary/Chest: Effort normal and breath sounds normal. No respiratory distress.  has no wheezes.  Neck = supple, no nuchal rigidity Abdominal: Soft. Bowel sounds are normal.  exhibits no distension. There is no tenderness.  Lymphadenopathy: no cervical adenopathy. No axillary adenopathy Neurological: alert and oriented to person, place, and time.  Skin: Skin is warm and dry. No rash noted. No erythema.  Psychiatric: a normal mood and affect.  behavior is normal.   Lab Results  Component Value Date   CD4TCELL 33 08/26/2018   Lab Results  Component Value Date   CD4TABS 460 08/26/2018   CD4TABS 530 10/02/2017   CD4TABS 440 05/28/2017   Lab Results  Component Value Date   HIV1RNAQUANT <20 NOT DETECTED 08/26/2018   No results found for: HEPBSAB Lab Results  Component Value Date   LABRPR NON-REACTIVE 08/26/2018    CBC Lab Results  Component Value Date   WBC 5.9 08/26/2018   RBC 4.48 08/26/2018   HGB 14.0 08/26/2018   HCT 39.9 08/26/2018   PLT 203 08/26/2018   MCV 89.1 08/26/2018   MCH 31.3 08/26/2018   MCHC 35.1 08/26/2018  RDW 12.7 08/26/2018   LYMPHSABS 1,375 08/26/2018   MONOABS 384 02/12/2017   EOSABS 47 08/26/2018    BMET Lab Results  Component Value Date   NA 138 08/26/2018   K 3.9 08/26/2018   CL  104 08/26/2018   CO2 27 08/26/2018   GLUCOSE 92 08/26/2018   BUN 11 08/26/2018   CREATININE 0.91 08/26/2018   CALCIUM 9.6 08/26/2018   GFRNONAA 88 08/26/2018   GFRAA 102 08/26/2018      Assessment and Plan  Health maintenance =  Will give flu shot and prevnar 13   hiv disease= will discuss with her to start taking prenatal vitamins and discuss alternative regimens Wants to switch regimen in the event of pregnancy

## 2018-09-29 NOTE — Progress Notes (Signed)
Biktarvy >> [employer]

## 2018-10-01 NOTE — Progress Notes (Signed)
HPI: Thanvi Federer is a 28 y.o. female who presents to the [facility] clinic for HIV follow-up.  Patient Active Problem List   Diagnosis Date Noted  . Supervision of high-risk pregnancy 04/05/2016  . Low blood pressure, not hypotension 01/02/2016  . Lead exposure 12/16/2015  . Raised antibody titer 10/25/2015  . HIV disease (HCC) 09/30/2015    Patient's Medications  New Prescriptions   No medications on file  Previous Medications   DARUNAVIR-COBICISCTAT-EMTRICITABINE-TENOFOVIR ALAFENAMIDE (SYMTUZA) 800-150-200-10 MG TABS    Take 1 tablet by mouth daily with breakfast.  Modified Medications   No medications on file  Discontinued Medications   BIKTARVY 50-200-25 MG TABS TABLET    TAKE 1 TABLET BY MOUTH DAILY   BIKTARVY 50-200-25 MG TABS TABLET    TAKE 1 TABLET BY MOUTH DAILY    Allergies: No Known Allergies  Past Medical History: Past Medical History:  Diagnosis Date  . HIV disease (HCC) 09/30/2015  . Low blood pressure, not hypotension 01/02/2016  . Menorrhagia   . Shingles     Social History: Social History   Socioeconomic History  . Marital status: Married    Spouse name: Not on file  . Number of children: Not on file  . Years of education: Not on file  . Highest education level: Not on file  Occupational History  . Not on file  Social Needs  . Financial resource strain: Not on file  . Food insecurity:    Worry: Not on file    Inability: Not on file  . Transportation needs:    Medical: Not on file    Non-medical: Not on file  Tobacco Use  . Smoking status: Never Smoker  . Smokeless tobacco: Never Used  Substance and Sexual Activity  . Alcohol use: No  . Drug use: No  . Sexual activity: Yes    Partners: Male    Birth control/protection: None    Comment: declined condoms  Lifestyle  . Physical activity:    Days per week: Not on file    Minutes per session: Not on file  . Stress: Not on file  Relationships  . Social connections:    Talks on phone: Not  on file    Gets together: Not on file    Attends religious service: Not on file    Active member of club or organization: Not on file    Attends meetings of clubs or organizations: Not on file    Relationship status: Not on file  Other Topics Concern  . Not on file  Social History Narrative  . Not on file    Labs: Lab Results  Component Value Date   HIV1RNAQUANT <20 NOT DETECTED 08/26/2018   HIV1RNAQUANT <20 DETECTED (A) 10/02/2017   HIV1RNAQUANT <20 NOT DETECTED 05/28/2017   CD4TABS 460 08/26/2018   CD4TABS 530 10/02/2017   CD4TABS 440 05/28/2017    RPR and STI Lab Results  Component Value Date   LABRPR NON-REACTIVE 08/26/2018   LABRPR Nonreactive 12/14/2015    STI Results GC CT  08/26/2018 Negative Negative  12/14/2015 - Negative    Hepatitis B Lab Results  Component Value Date   HEPBSAG Negative 12/14/2015   Hepatitis C Lab Results  Component Value Date   HEPCAB Negative 03/21/2016   Hepatitis A No results found for: HAV Lipids: Lab Results  Component Value Date   CHOL 122 08/26/2018   TRIG 89 08/26/2018   HDL 42 (L) 08/26/2018   CHOLHDL 2.9 08/26/2018  LDLCALC 63 08/26/2018    Current HIV Regimen: Biktarvy  Assessment: Daryana is here today to see Dr. Drue Second for HIV follow-up.  She is currently taking Biktarvy but will be switching to [employer].   Explained that Darrell Jewel is a one pill once daily medication with food and the importance of not missing any doses. Explained resistance and how it develops and why it is so important to take Symtuza daily and not skip days or doses. Counseled patient to take it around the same time each day with a meal and the importance of having food on your stomach in order for the medication to be absorbed properly. Counseled on what to do if dose is missed, if closer to missed dose take immediately, if closer to next dose then skip and resume normal schedule.   Cautioned on possible side effects the first week or so  including nausea, diarrhea, dizziness, fatigue, rash, and headaches but that they should resolve after the first couple of weeks. I reviewed patient medications and found no drug interactions. Discussed with patient to call clinic if she starts a new medication or herbal supplement. I gave the patient my card and told her to call me with any issues/questions/concerns.  Her HMAP is approved.  If she becomes pregnant, she will need to be switched to another treatment again, so I told her to call us ASAP if she becomes pregnant.  Plan: - Stop Biktarvy  - Start Symtuza PO once daily with food  Wilhelm Ganaway L. Tammi Boulier, PharmD, BCIDP, AAHIVP, CPP Infectious Diseases Clinical Pharmacist [facility] 09/29/2018, 11:44 AM

## 2018-10-17 ENCOUNTER — Other Ambulatory Visit: Payer: Self-pay | Admitting: Internal Medicine

## 2018-11-17 ENCOUNTER — Other Ambulatory Visit: Payer: Self-pay | Admitting: Internal Medicine

## 2018-12-03 ENCOUNTER — Telehealth: Payer: Self-pay | Admitting: *Deleted

## 2018-12-03 NOTE — Telephone Encounter (Signed)
Patient's husband called. Mary Zhang has been feeling intense nausea recently, and had a positive home pregnancy test.  Her last menstrual cycle started 4/9.  She is supposed to take Symtuza but has been unable to tolerate this due to her nausea.  She is unsure if she should keep taking Symtuza at this time, needs medication for nausea, needs referral to OB/GYN.  RN gave patient an appointment 5/14 2:00 with [facility] for medication review/adjustment.  Will include Cassie, pharmacist. Andree Coss, RN

## 2018-12-04 ENCOUNTER — Encounter: Payer: Self-pay | Admitting: Family

## 2018-12-04 ENCOUNTER — Ambulatory Visit (INDEPENDENT_AMBULATORY_CARE_PROVIDER_SITE_OTHER): Payer: Self-pay | Admitting: Family

## 2018-12-04 ENCOUNTER — Other Ambulatory Visit: Payer: Self-pay

## 2018-12-04 ENCOUNTER — Other Ambulatory Visit: Payer: Self-pay | Admitting: *Deleted

## 2018-12-04 VITALS — BP 115/74 | HR 106 | Temp 98.2°F | Wt 155.0 lb

## 2018-12-04 DIAGNOSIS — O219 Vomiting of pregnancy, unspecified: Secondary | ICD-10-CM | POA: Insufficient documentation

## 2018-12-04 DIAGNOSIS — O98711 Human immunodeficiency virus [HIV] disease complicating pregnancy, first trimester: Secondary | ICD-10-CM

## 2018-12-04 DIAGNOSIS — Z3201 Encounter for pregnancy test, result positive: Secondary | ICD-10-CM

## 2018-12-04 DIAGNOSIS — B20 Human immunodeficiency virus [HIV] disease: Secondary | ICD-10-CM

## 2018-12-04 LAB — POCT URINE PREGNANCY: Preg Test, Ur: POSITIVE — AB

## 2018-12-04 MED ORDER — ONE-A-DAY WOMENS PRENATAL 1 28-0.8-235 MG PO CAPS: 1.0000 | ORAL_CAPSULE | Freq: Every day | ORAL | 2 refills | Status: DC

## 2018-12-04 MED ORDER — VITAMIN B-6 25 MG PO TABS: ORAL_TABLET | ORAL | 0 refills | Status: DC

## 2018-12-04 MED ORDER — ABACAVIR-DOLUTEGRAVIR-LAMIVUD 600-50-300 MG PO TABS: 1.0000 | ORAL_TABLET | Freq: Every day | ORAL | 5 refills | Status: DC

## 2018-12-04 MED ORDER — DOXYLAMINE SUCCINATE (SLEEP) 25 MG PO TABS: ORAL_TABLET | ORAL | 0 refills | Status: DC

## 2018-12-04 NOTE — Assessment & Plan Note (Addendum)
Mary Zhang has mild vomiting with few episodes of nausea related to her first trimester of pregnancy. Discussed eliminating trigger foods and eat small frequent meals as tolerated. Doxylamine and B6 sent to pharmacy for nausea and vomiting as needed. Will check electrolytes today.  Follow up for worsening.

## 2018-12-04 NOTE — Assessment & Plan Note (Addendum)
Ms. Piwowarczyk has well-controlled HIV disease and recently missed her menstrual cycle with urine pregnancy test resulting positive. She is approximately [redacted] weeks pregnant with estimated due date of 08/06/19. Recommend starting pre-natal vitamin. Stop Symtuza and change to Triumeq. [facility] counselor will assist in obtaining pregnancy medicaid and WIC. Will need referral to Obstetrics. Plan for follow up in 1 month or sooner if needed.

## 2018-12-04 NOTE — Progress Notes (Signed)
Subjective:    Patient ID: Mary Zhang, female    DOB: 07/05/1991, 28 y.o.   MRN: [id number]  Chief Complaint  Patient presents with  . HIV Positive/AIDS  . Possible Pregnancy     HPI:  Mary Zhang is a 28 y.o. female with HIV disease who was last seen in the office on 09/29/18 with good adherence and tolerance of her ART regimen of Symtuza. Viral load was undetectable and CD4 count was 460. Mary Zhang's husband called [facility] triage line yesterday as Mary Zhang has been having intense nausea and had a positive home pregnancy test. Her last menstrual cycle started on 10/30/18. She is unable to take her Symtuza. Mary Zhang speaks primarily Amharic and a medical translator is present   Mary Zhang has not been able to take her Symtuza for the last couple of days due to vomiting. No nausea presently. She has had vomiting of about 1-3 times per day. She is able to eat and drink small portions. Not currently on a prenatal vitamin.   No Known Allergies    Outpatient Medications Prior to Visit  Medication Sig Dispense Refill  . Darunavir-Cobicisctat-Emtricitabine-Tenofovir Alafenamide (SYMTUZA) 800-150-200-10 MG TABS Take 1 tablet by mouth daily with breakfast. 30 tablet 11   No facility-administered medications prior to visit.      Past Medical History:  Diagnosis Date  . HIV disease (HCC) 09/30/2015  . Low blood pressure, not hypotension 01/02/2016  . Menorrhagia   . Shingles      Past Surgical History:  Procedure Laterality Date  . NO PAST SURGERIES       Review of Systems  Constitutional: Negative for appetite change, chills, diaphoresis, fatigue, fever and unexpected weight change.  Eyes:       Negative for acute change in vision  Respiratory: Negative for chest tightness, shortness of breath and wheezing.   Cardiovascular: Negative for chest pain.  Gastrointestinal: Positive for vomiting. Negative for diarrhea and nausea.  Genitourinary: Negative for dysuria, pelvic pain and  vaginal discharge.       Missed menstrual cycle  Musculoskeletal: Negative for neck pain and neck stiffness.  Skin: Negative for rash.  Neurological: Negative for seizures, syncope, weakness and headaches.  Hematological: Negative for adenopathy. Does not bruise/bleed easily.  Psychiatric/Behavioral: Negative for hallucinations.      Objective:    BP 115/74   Pulse (!) 106   Temp 98.2 F (36.8 C) (Oral)   Wt 155 lb (70.3 kg)   LMP 10/30/2018   BMI 28.35 kg/m  Nursing note and vital signs reviewed.  Physical Exam Constitutional:      General: She is not in acute distress.    Appearance: She is well-developed.  Cardiovascular:     Rate and Rhythm: Normal rate and regular rhythm.     Heart sounds: Normal heart sounds.  Pulmonary:     Effort: Pulmonary effort is normal.     Breath sounds: Normal breath sounds.  Skin:    General: Skin is warm and dry.  Neurological:     Mental Status: She is alert and oriented to person, place, and time.  Psychiatric:        Mood and Affect: Mood normal.        Assessment & Plan:   Problem List Items Addressed This Visit      Digestive   Nausea and vomiting during pregnancy    Mary Zhang has mild vomiting with few episodes of nausea related to her first trimester of  pregnancy. Discussed eliminating trigger foods and eat small frequent meals as tolerated. Doxylamine and B6 sent to pharmacy for nausea and vomiting as needed. Will check electrolytes today.  Follow up for worsening.       Relevant Medications   doxylamine, Sleep, (UNISOM) 25 MG tablet   vitamin B-6 (PYRIDOXINE) 25 MG tablet     Other   HIV disease affecting pregnancy in first trimester - Primary    Mary Zhang has well-controlled HIV disease and recently missed her menstrual cycle with urine pregnancy test resulting positive. She is approximately [redacted] weeks pregnant with estimated due date of 08/06/19. Recommend starting pre-natal vitamin. Stop Symtuza and change to Triumeq.  Bridge counselor will assist in obtaining pregnancy medicaid and WIC. Will need referral to Obstetrics. Plan for follow up in 1 month or sooner if needed.       Relevant Medications   abacavir-dolutegravir-lamiVUDine (TRIUMEQ) 600-50-300 MG tablet   Other Relevant Orders   HIV-1 RNA quant-no reflex-bld   T-helper cell (CD4)- ([facility] clinic only)   COMPLETE METABOLIC PANEL WITH GFR   hCG, serum, qualitative    Other Visit Diagnoses    Positive pregnancy test           I have discontinued Mary Zhang Darunavir-Cobicisctat-Emtricitabine-Tenofovir Alafenamide. I am also having her start on doxylamine (Sleep), vitamin B-6, abacavir-dolutegravir-lamiVUDine, and One-A-Day Womens Prenatal 1.   Meds ordered this encounter  Medications  . doxylamine, Sleep, (UNISOM) 25 MG tablet    Sig: Take 12.5 mg (0.5 tablets) every 4-6 hours as needed for nausea and vomiting.    Dispense:  30 tablet    Refill:  0    Order Specific Question:   Supervising Provider    Answer:   Judyann Munson [[id number]]  . vitamin B-6 (PYRIDOXINE) 25 MG tablet    Sig: Take 1 tablet by mouth every 4-6 hours as needed for nausea.    Dispense:  100 tablet    Refill:  0    Order Specific Question:   Supervising Provider    Answer:   Judyann Munson [[id number]]  . abacavir-dolutegravir-lamiVUDine (TRIUMEQ) 600-50-300 MG tablet    Sig: Take 1 tablet by mouth daily.    Dispense:  30 tablet    Refill:  5    Order Specific Question:   Supervising Provider    Answer:   Judyann Munson [[id number]]  . Prenat-Fe Carbonyl-FA-Omega 3 (ONE-A-DAY WOMENS PRENATAL 1) 28-0.8-235 MG CAPS    Sig: Take 1 tablet by mouth daily.    Dispense:  30 capsule    Refill:  2    She needs a pre-natal vitamin and okay with any substitution. She does not speak English well so any assistance is appreciated.    Order Specific Question:   Supervising Provider    Answer:   Judyann Munson [[id number]]     Follow-up: Return in about 1 month (around 01/04/2019), or if  symptoms worsen or fail to improve.   Marcos Eke, MSN, FNP-C Nurse Practitioner [facility] Main number: [phone]

## 2018-12-04 NOTE — Patient Instructions (Addendum)
Nice to meet you!  You do have a positive pregnancy test. Congratulations!  We will check your blood work today.   Please start taking a pre-natal vitamin daily.  Use the doxylamine and pyroxidine as needed for nausea and vomiting.  We have changed your medication from [facility] to [facility].  Plan for follow up in 1 month or sooner if needed to recheck viral load.

## 2018-12-05 LAB — T-HELPER CELL (CD4) - (RCID CLINIC ONLY)
CD4 % Helper T Cell: 35 % (ref 33–65)
CD4 T Cell Abs: 429 /uL (ref 400–1790)

## 2018-12-08 NOTE — Addendum Note (Signed)
Addended by: Andree Coss on: 12/08/2018 08:59 AM   Modules accepted: Orders

## 2018-12-10 LAB — COMPLETE METABOLIC PANEL WITH GFR
AG Ratio: 1.4 (calc) (ref 1.0–2.5)
ALT: 10 U/L (ref 6–29)
AST: 13 U/L (ref 10–30)
Albumin: 4.2 g/dL (ref 3.6–5.1)
Alkaline phosphatase (APISO): 48 U/L (ref 31–125)
BUN: 7 mg/dL (ref 7–25)
CO2: 29 mmol/L (ref 20–32)
Calcium: 9.6 mg/dL (ref 8.6–10.2)
Chloride: 104 mmol/L (ref 98–110)
Creat: 0.53 mg/dL (ref 0.50–1.10)
GFR, Est African American: 152 mL/min/{1.73_m2} (ref >=60)
GFR, Est Non African American: 131 mL/min/{1.73_m2} (ref >=60)
Globulin: 3 g/dL (calc) (ref 1.9–3.7)
Glucose, Bld: 95 mg/dL (ref 65–99)
Potassium: 3.7 mmol/L (ref 3.5–5.3)
Sodium: 138 mmol/L (ref 135–146)
Total Bilirubin: 0.7 mg/dL (ref 0.2–1.2)
Total Protein: 7.2 g/dL (ref 6.1–8.1)

## 2018-12-10 LAB — HCG, SERUM, QUALITATIVE: Preg, Serum: POSITIVE — AB

## 2018-12-10 LAB — HIV-1 RNA QUANT-NO REFLEX-BLD
HIV 1 RNA Quant: <20 copies/mL
HIV-1 RNA Quant, Log: <1.3 Log copies/mL

## 2018-12-12 ENCOUNTER — Telehealth: Payer: Self-pay | Admitting: *Deleted

## 2018-12-12 NOTE — Telephone Encounter (Signed)
Mitch called to let [facility] know he has connected with patient for connection to [facility], food stamps, and pregnancy medicaid application. Andree Coss, RN

## 2018-12-16 NOTE — Telephone Encounter (Signed)
Awesome, thanks.

## 2018-12-30 ENCOUNTER — Telehealth: Payer: Self-pay | Admitting: Internal Medicine

## 2018-12-30 NOTE — Telephone Encounter (Signed)
COVID-19 Pre-Screening Questions:  Do you currently have a fever (>100 F), chills or unexplained body aches? No   Are you currently experiencing new cough, shortness of breath, sore throat, runny nose? No    Have you recently travelled outside the state of [place] in the last 14 days?no    1. Have you been in contact with someone that is currently pending confirmation of Covid19 testing or has been confirmed to have the Covid19 virus?  no

## 2018-12-31 ENCOUNTER — Other Ambulatory Visit: Payer: Self-pay

## 2018-12-31 ENCOUNTER — Ambulatory Visit (INDEPENDENT_AMBULATORY_CARE_PROVIDER_SITE_OTHER): Payer: Self-pay | Admitting: Internal Medicine

## 2018-12-31 ENCOUNTER — Encounter: Payer: Self-pay | Admitting: Internal Medicine

## 2018-12-31 VITALS — Ht 62.0 in | Wt 154.0 lb

## 2018-12-31 DIAGNOSIS — Z79899 Other long term (current) drug therapy: Secondary | ICD-10-CM

## 2018-12-31 DIAGNOSIS — Z3490 Encounter for supervision of normal pregnancy, unspecified, unspecified trimester: Secondary | ICD-10-CM

## 2018-12-31 DIAGNOSIS — B20 Human immunodeficiency virus [HIV] disease: Secondary | ICD-10-CM

## 2018-12-31 NOTE — Progress Notes (Signed)
RFV: follow up for hiv disease/  Patient ID: Mary Zhang, female   DOB: Nov 17, 1990, 28 y.o.   MRN: [id number]  HPI 28yo F with well controlled hiv disease, currently on on triumeq. She Recently found out that she is pregnat. Has wicc, and new medication on triumeq. Also has appt at [facility].  She has been doing well. Mild nausea but no vomiting. Overall doing well. No sick contact. No covid-19 exposure  Outpatient Encounter Medications as of 12/31/2018  Medication Sig  . abacavir-dolutegravir-lamiVUDine (TRIUMEQ) 600-50-300 MG tablet Take 1 tablet by mouth daily.  Marland Kitchen doxylamine, Sleep, (UNISOM) 25 MG tablet Take 12.5 mg (0.5 tablets) every 4-6 hours as needed for nausea and vomiting.  . Prenat-Fe Carbonyl-FA-Omega 3 (ONE-A-DAY WOMENS PRENATAL 1) 28-0.8-235 MG CAPS Take 1 tablet by mouth daily.  . vitamin B-6 (PYRIDOXINE) 25 MG tablet Take 1 tablet by mouth every 4-6 hours as needed for nausea.   No facility-administered encounter medications on file as of 12/31/2018.      Patient Active Problem List   Diagnosis Date Noted  . Nausea and vomiting during pregnancy 12/04/2018  . Supervision of high-risk pregnancy 04/05/2016  . Low blood pressure, not hypotension 01/02/2016  . Lead exposure 12/16/2015  . Raised antibody titer 10/25/2015  . HIV disease affecting pregnancy in first trimester 09/30/2015     Health Maintenance Due  Topic Date Due  . PAP-Cervical Cytology Screening  09/30/2018  . PAP SMEAR-Modifier  09/30/2018    Social History   Tobacco Use  . Smoking status: Never Smoker  . Smokeless tobacco: Never Used  Substance Use Topics  . Alcohol use: No  . Drug use: No    Review of Systems 12 point ros is negative except what is mentioned in hpi Physical Exam   Ht 5\' 2"  (1.575 m)   Wt 154 lb (69.9 kg)   LMP 10/30/2018   BMI 28.17 kg/m  Physical Exam  Constitutional:  oriented to person, place, and time. appears well-developed and well-nourished. No  distress.  HENT: [place]/AT, PERRLA, no scleral icterus Mouth/Throat: Oropharynx is clear and moist. No oropharyngeal exudate.  Cardiovascular: Normal rate, regular rhythm and normal heart sounds. Exam reveals no gallop and no friction rub.  No murmur heard.  Pulmonary/Chest: Effort normal and breath sounds normal. No respiratory distress.  has no wheezes.  Neck = supple, no nuchal rigidity Abdominal: Soft. Bowel sounds are normal.  exhibits no distension. There is no tenderness.  Lymphadenopathy: no cervical adenopathy. No axillary adenopathy Neurological: alert and oriented to person, place, and time.  Skin: Skin is warm and dry. No rash noted. No erythema.  Psychiatric: a normal mood and affect.  behavior is normal.    Lab Results  Component Value Date   CD4TCELL 35 12/04/2018   Lab Results  Component Value Date   CD4TABS 429 12/04/2018   CD4TABS 460 08/26/2018   CD4TABS 530 10/02/2017   Lab Results  Component Value Date   HIV1RNAQUANT <20 NOT DETECTED 12/04/2018   No results found for: HEPBSAB Lab Results  Component Value Date   LABRPR NON-REACTIVE 08/26/2018    CBC Lab Results  Component Value Date   WBC 5.9 08/26/2018   RBC 4.48 08/26/2018   HGB 14.0 08/26/2018   HCT 39.9 08/26/2018   PLT 203 08/26/2018   MCV 89.1 08/26/2018   MCH 31.3 08/26/2018   MCHC 35.1 08/26/2018   RDW 12.7 08/26/2018   LYMPHSABS 1,375 08/26/2018   MONOABS 384 02/12/2017  EOSABS 47 08/26/2018    BMET Lab Results  Component Value Date   NA 138 12/04/2018   K 3.7 12/04/2018   CL 104 12/04/2018   CO2 29 12/04/2018   GLUCOSE 95 12/04/2018   BUN 7 12/04/2018   CREATININE 0.53 12/04/2018   CALCIUM 9.6 12/04/2018   GFRNONAA 131 12/04/2018   GFRAA 152 12/04/2018      Assessment and Plan hiv disease = well controlled. Continue on triumeq. Will check VL at next visit  Pregnancy = continue with prenatal vitamin and folic acid supplementation.has upcoming visit with ob  Long term  medication = cr stable  RTC in 6 wk- helping with medicaid

## 2018-12-31 NOTE — Progress Notes (Signed)
C/o lower back pain for 2 months, states maybe related to pregnancy. Feels like cramps.

## 2019-01-07 ENCOUNTER — Telehealth: Payer: Self-pay | Admitting: Family Medicine

## 2019-01-07 NOTE — Telephone Encounter (Signed)
Attempted to call patient with Amharic interpreter ID# [phone] about her appointment on 6/18 @ 9:15. No answer, interpreter left voicemail instructing patient to be sure she has the [employer] webex app is downloaded before her appointment. Patient was instructed that this is a virtual visit. Instructed that she is needs any assistance getting the app to give the office a call back. Screening not completed due to it being a virtual appointment.

## 2019-01-08 ENCOUNTER — Other Ambulatory Visit: Payer: Self-pay

## 2019-01-08 ENCOUNTER — Ambulatory Visit (INDEPENDENT_AMBULATORY_CARE_PROVIDER_SITE_OTHER): Payer: Self-pay | Admitting: *Deleted

## 2019-01-08 DIAGNOSIS — B20 Human immunodeficiency virus [HIV] disease: Secondary | ICD-10-CM

## 2019-01-08 DIAGNOSIS — O0990 Supervision of high risk pregnancy, unspecified, unspecified trimester: Secondary | ICD-10-CM

## 2019-01-08 NOTE — Progress Notes (Signed)
I connected with  Mary Zhang on 01/08/19 at  9:15 AM EDT by telephone and verified that I am speaking with the correct person using two identifiers.   I discussed the limitations, risks, security and privacy concerns of performing an evaluation and management service by telephone and the availability of in person appointments. I also discussed with the patient that there may be a patient responsible charge related to this service. The patient expressed understanding and agreed to proceed.  I assisted [name] with downloading Webex app. And accessing webex- we could connect only by audio on webex.  We confimred her EDD.  I reviewed her ob appt and to wear mask that day.  I explained she will need fasting blood work and new ob bloodwork and she states she can come 7/2 am for fasting blood work and then come back for new ob appt already scheduled.  I explained we will schedule her for an [place] at 19 weeks and we will give her that appointment when she comes to her new ob appt 72/20. I explained we would like her to check her bp weekly. She states she has applied for medicaid but still waiting to hear. I explained we will order her a bp cuff once she has medicaid.She voices understanding.   [name],RN 01/08/2019  9:39 AM

## 2019-01-09 NOTE — Progress Notes (Signed)
I have reviewed the chart and agree with nursing staff's documentation of this patient's encounter.  Roberts Bon, MD 01/09/2019 3:04 PM    

## 2019-01-22 ENCOUNTER — Ambulatory Visit (INDEPENDENT_AMBULATORY_CARE_PROVIDER_SITE_OTHER): Payer: Medicaid Other | Admitting: Obstetrics and Gynecology

## 2019-01-22 ENCOUNTER — Other Ambulatory Visit: Payer: Medicaid Other

## 2019-01-22 ENCOUNTER — Other Ambulatory Visit: Payer: Self-pay

## 2019-01-22 ENCOUNTER — Other Ambulatory Visit (HOSPITAL_COMMUNITY)
Admission: RE | Admit: 2019-01-22 | Discharge: 2019-01-22 | Disposition: A | Discharge status: Home / Self Care | Payer: Medicaid Other | Source: Ambulatory Visit | Attending: Obstetrics and Gynecology | Admitting: Obstetrics and Gynecology

## 2019-01-22 ENCOUNTER — Telehealth: Payer: Self-pay | Admitting: Obstetrics and Gynecology

## 2019-01-22 ENCOUNTER — Encounter: Payer: Self-pay | Admitting: Obstetrics and Gynecology

## 2019-01-22 VITALS — BP 101/68 | HR 87 | Temp 98.8°F | Wt 153.6 lb

## 2019-01-22 DIAGNOSIS — O99119 Other diseases of the blood and blood-forming organs and certain disorders involving the immune mechanism complicating pregnancy, unspecified trimester: Secondary | ICD-10-CM | POA: Insufficient documentation

## 2019-01-22 DIAGNOSIS — O0990 Supervision of high risk pregnancy, unspecified, unspecified trimester: Secondary | ICD-10-CM | POA: Diagnosis not present

## 2019-01-22 DIAGNOSIS — Z3A12 12 weeks gestation of pregnancy: Secondary | ICD-10-CM

## 2019-01-22 DIAGNOSIS — A5901 Trichomonal vulvovaginitis: Secondary | ICD-10-CM

## 2019-01-22 DIAGNOSIS — Z8759 Personal history of other complications of pregnancy, childbirth and the puerperium: Secondary | ICD-10-CM | POA: Insufficient documentation

## 2019-01-22 DIAGNOSIS — D696 Thrombocytopenia, unspecified: Secondary | ICD-10-CM | POA: Insufficient documentation

## 2019-01-22 DIAGNOSIS — O98711 Human immunodeficiency virus [HIV] disease complicating pregnancy, first trimester: Secondary | ICD-10-CM

## 2019-01-22 DIAGNOSIS — B20 Human immunodeficiency virus [HIV] disease: Secondary | ICD-10-CM

## 2019-01-22 DIAGNOSIS — Z789 Other specified health status: Secondary | ICD-10-CM | POA: Insufficient documentation

## 2019-01-22 DIAGNOSIS — O99111 Other diseases of the blood and blood-forming organs and certain disorders involving the immune mechanism complicating pregnancy, first trimester: Secondary | ICD-10-CM

## 2019-01-22 DIAGNOSIS — O23591 Infection of other part of genital tract in pregnancy, first trimester: Secondary | ICD-10-CM

## 2019-01-22 DIAGNOSIS — O98719 Human immunodeficiency virus [HIV] disease complicating pregnancy, unspecified trimester: Secondary | ICD-10-CM | POA: Insufficient documentation

## 2019-01-22 DIAGNOSIS — O0991 Supervision of high risk pregnancy, unspecified, first trimester: Secondary | ICD-10-CM

## 2019-01-22 LAB — GLUCOSE, CAPILLARY: Glucose-Capillary: 71 mg/dL (ref 70–99)

## 2019-01-22 MED ORDER — PREPLUS 27-1 MG PO TABS: 1.0000 | ORAL_TABLET | Freq: Every day | ORAL | 13 refills | Status: DC

## 2019-01-22 MED ORDER — AMBULATORY NON FORMULARY MEDICATION: 1.0000 | 0 refills | Status: DC

## 2019-01-22 MED ORDER — FOLIC ACID 1 MG PO TABS: 1.0000 mg | ORAL_TABLET | Freq: Every day | ORAL | 10 refills | Status: DC

## 2019-01-22 NOTE — Telephone Encounter (Signed)
The patient stated her husband tested postive for Covid19 on May 9th. She stated he has completed the quarantine period of 14 days.

## 2019-01-22 NOTE — Progress Notes (Addendum)
New OB Note  01/22/2019   Clinic: Center for [facility]  Chief Complaint: NOB  Transfer of Care Patient: no  History of Present Illness: Mary Zhang is a 28 y.o. G2P0101 @ 12/0 weeks ([place] 1/14, tentative, based on Patient's last menstrual period was 10/30/2018.).  Preg complicated by has HIV disease ([place]); Low blood pressure, not hypotension; Raised antibody titer; Lead exposure; Nausea and vomiting during pregnancy; Supervision of high risk pregnancy, antepartum; HIV infection in mother during pregnancy, antepartum; Language barrier; and History of preterm premature rupture of membranes on their problem list.   Any events prior to today's visit: no Her periods were: qmonth, regular She was using no method when she conceived.  She has mild signs or symptoms of nausea/vomiting of pregnancy. She has Negative signs or symptoms of miscarriage or preterm labor  ROS: A 12-point review of systems was performed and negative, except as stated in the above HPI.  OBGYN History: As per HPI. OB History  Gravida Para Term Preterm AB Living  2 1 0 1   1  SAB TAB Ectopic Multiple Live Births          1    # Outcome Date GA Lbr Len/2nd Weight Sex Delivery Anes PTL Lv  2 Current           1 Preterm 06/09/16 [redacted]w[redacted]d 5 lb 12.4 oz (2.62 kg)  Vag-Spont EPI Y LIV     Complications: Preterm premature rupture of membranes    Any issues with any prior pregnancies: 34wk PPROM Prior children are healthy, doing well, and without any problems or issues: yes History of pap smears: Yes. Last pap smear unknown and results were unknown   Past Medical History: Past Medical History:  Diagnosis Date  . HIV disease ([place] 09/30/2015  . Low blood pressure, not hypotension 01/02/2016  . Menorrhagia   . Shingles     Past Surgical History: Past Surgical History:  Procedure Laterality Date  . NO PAST SURGERIES      Family History:  She denies any history of mental retardation, birth defects or genetic  disorders in her or the [name]'s history  Social History:  Social History   Socioeconomic History  . Marital status: Married    Spouse name: Not on file  . Number of children: Not on file  . Years of education: Not on file  . Highest education level: Not on file  Occupational History  . Not on file  Social Needs  . Financial resource strain: Not on file  . Food insecurity    Worry: Never true    Inability: Never true  . Transportation needs    Medical: No    Non-medical: No  Tobacco Use  . Smoking status: Never Smoker  . Smokeless tobacco: Never Used  Substance and Sexual Activity  . Alcohol use: No  . Drug use: No  . Sexual activity: Yes    Partners: Male    Birth control/protection: None    Comment: declined condoms  Lifestyle  . Physical activity    Days per week: Not on file    Minutes per session: Not on file  . Stress: Not on file  Relationships  . Social [place] phone: Not on file    Gets together: Not on file    Attends religious service: Not on file    Active member of club or organization: Not on file    Attends meetings of clubs or organizations:  Not on file    Relationship status: Not on file  . Intimate partner violence    Fear of current or ex partner: Not on file    Emotionally abused: Not on file    Physically abused: Not on file    Forced sexual activity: Not on file  Other Topics Concern  . Not on file  Social History Narrative  . Not on file     Allergy: No Known Allergies  Health Maintenance:  Mammogram Up to Date: not applicable  Current Outpatient Medications: [place]  Physical Exam:   BP 101/68   Pulse 87   Temp 98.8 F (37.1 C)   Wt 153 lb 9.6 oz (69.7 kg)   LMP 10/30/2018   BMI 28.09 kg/m  Body mass index is 28.09 kg/m. Contractions: Not present Vag. Bleeding: None. Fundal height: not applicable FHTs: 916O  General appearance: Well nourished, well developed female in no acute distress.   Cardiovascular: S1, S2 normal, no murmur, rub or gallop, regular rate and rhythm Respiratory:  Clear to auscultation bilateral. Normal respiratory effort Abdomen: positive bowel sounds and no masses, hernias; diffusely non tender to palpation, non distended Breasts: pt denies any breast s/s. Neuro/Psych:  Normal mood and affect.  Skin:  Warm and dry.  Lymphatic:  No inguinal lymphadenopathy.   Pelvic exam: is not limited by body habitus EGBUS: within normal limits, Vagina: within normal limits and with no blood in the vault, Cervix: normal appearing cervix without discharge or lesions, closed/long/high, Uterus:  enlarged, c/w 12 week size, and Adnexa:  normal adnexa and no mass, fullness, tenderness  Laboratory: See below  Imaging:  none  Assessment: pt doing well  Plan: 1. Supervision of high risk pregnancy, antepartum Routine care. Declines genetics. Anatomy u/s already scheduled. NOB labs today - AMBULATORY NON FORMULARY MEDICATION; 1 Device by Other route once a week. Blood Pressure Cuff Medium Monitored Regularly at home ICD 10:Z34.90  Dispense: 1 kit; Refill: 0 - Hemoglobin A1c - Culture, OB Urine - Obstetric Panel, Including HIV - [place] MFM OB DETAIL +14 WK - Cytology - PAP( [place]) - Comprehensive metabolic panel - Protein / creatinine ratio, urine  2. HIV infection in mother during pregnancy, antepartum Followed by Dr. Baxter Flattery and she is aware pt is pregnant. VL undetectable and normal cd4 count early June  3. Language barrier Interpreter used  4. History of preterm premature rupture of membranes D/w her re: 17p. Pt declines  5. HIV disease ([place]) See above.   6. History of gestational thrombocytopenia Late in pregnancy. Follow up NOB, 28wk  Problem list reviewed and updated.  Follow up in 4 weeks.  >50% of 30 min visit spent on counseling and coordination of care.     Durene Romans MD Attending Center for [facility])

## 2019-01-22 NOTE — Progress Notes (Signed)
KG used to interpret.  BP cuff ordered.  Message sent to front office staff to fax. Medicaid home form completed.

## 2019-01-23 LAB — PROTEIN / CREATININE RATIO, URINE
Creatinine, Urine: 178.6 mg/dL
Protein, Ur: 17.2 mg/dL
Protein/Creat Ratio: 96 mg/g creat (ref 0–200)

## 2019-01-24 LAB — URINE CULTURE, OB REFLEX: Organism ID, Bacteria: NO GROWTH

## 2019-01-24 LAB — CULTURE, OB URINE

## 2019-01-26 LAB — OBSTETRIC PANEL, INCLUDING HIV
Antibody Screen: NEGATIVE
Basophils Absolute: 0 10*3/uL (ref 0.0–0.2)
Basos: 0 %
EOS (ABSOLUTE): 0 10*3/uL (ref 0.0–0.4)
Eos: 1 %
HIV Screen 4th Generation wRfx: REACTIVE — AB
Hematocrit: 36.2 % (ref 34.0–46.6)
Hemoglobin: 12.6 g/dL (ref 11.1–15.9)
Hepatitis B Surface Ag: NEGATIVE
Immature Grans (Abs): 0 10*3/uL (ref 0.0–0.1)
Immature Granulocytes: 1 %
Lymphocytes Absolute: 0.9 10*3/uL (ref 0.7–3.1)
Lymphs: 16 %
MCH: 31.9 pg (ref 26.6–33.0)
MCHC: 34.8 g/dL (ref 31.5–35.7)
MCV: 92 fL (ref 79–97)
Monocytes Absolute: 0.3 10*3/uL (ref 0.1–0.9)
Monocytes: 5 %
Neutrophils Absolute: 4.3 10*3/uL (ref 1.4–7.0)
Neutrophils: 77 %
Platelets: 144 10*3/uL — ABNORMAL LOW (ref 150–450)
RBC: 3.95 x10E6/uL (ref 3.77–5.28)
RDW: 13.4 % (ref 11.7–15.4)
RPR Ser Ql: NONREACTIVE
Rh Factor: POSITIVE
Rubella Antibodies, IGG: 3.54 index (ref >0.99)
WBC: 5.5 10*3/uL (ref 3.4–10.8)

## 2019-01-26 LAB — COMPREHENSIVE METABOLIC PANEL
ALT: 10 IU/L (ref 0–32)
AST: 13 IU/L (ref 0–40)
Albumin/Globulin Ratio: 1.6 (ref 1.2–2.2)
Albumin: 4.2 g/dL (ref 3.9–5.0)
Alkaline Phosphatase: 50 IU/L (ref 39–117)
BUN/Creatinine Ratio: 8 — ABNORMAL LOW (ref 9–23)
BUN: 4 mg/dL — ABNORMAL LOW (ref 6–20)
Bilirubin Total: 0.4 mg/dL (ref 0.0–1.2)
CO2: 20 mmol/L (ref 20–29)
Calcium: 8.9 mg/dL (ref 8.7–10.2)
Chloride: 104 mmol/L (ref 96–106)
Creatinine, Ser: 0.53 mg/dL — ABNORMAL LOW (ref 0.57–1.00)
GFR calc Af Amer: 152 mL/min/{1.73_m2} (ref >59)
GFR calc non Af Amer: 131 mL/min/{1.73_m2} (ref >59)
Globulin, Total: 2.6 g/dL (ref 1.5–4.5)
Glucose: 84 mg/dL (ref 65–99)
Potassium: 3.5 mmol/L (ref 3.5–5.2)
Sodium: 136 mmol/L (ref 134–144)
Total Protein: 6.8 g/dL (ref 6.0–8.5)

## 2019-01-26 LAB — HEMOGLOBIN A1C
Est. average glucose Bld gHb Est-mCnc: 91 mg/dL
Hgb A1c MFr Bld: 4.8 % (ref 4.8–5.6)

## 2019-01-26 LAB — HIV 1/2 AB DIFFERENTIATION
HIV 1 Ab: POSITIVE — AB
HIV 2 Ab: NEGATIVE
NOTE (HIV CONF MULTIP: POSITIVE

## 2019-01-27 LAB — CYTOLOGY - PAP
Chlamydia: NEGATIVE
Diagnosis: NEGATIVE
Neisseria Gonorrhea: NEGATIVE
Trichomonas: POSITIVE — AB

## 2019-01-28 ENCOUNTER — Telehealth: Payer: Self-pay

## 2019-01-28 DIAGNOSIS — O23599 Infection of other part of genital tract in pregnancy, unspecified trimester: Secondary | ICD-10-CM | POA: Insufficient documentation

## 2019-01-28 DIAGNOSIS — A5901 Trichomonal vulvovaginitis: Secondary | ICD-10-CM | POA: Insufficient documentation

## 2019-01-28 MED ORDER — METRONIDAZOLE 500 MG PO TABS: ORAL_TABLET | ORAL | 0 refills | Status: DC

## 2019-01-28 NOTE — Telephone Encounter (Addendum)
-----   Message from Aletha Halim, MD sent at 01/28/2019  8:14 AM EDT ----- Please let her know re: the trich. Flagyl already sent in  Called pt with [facility] # [phone] and LM that I am calling with results and that we have sent an Rx to your [facility] on [address].

## 2019-01-28 NOTE — Addendum Note (Signed)
Addended by: Aletha Halim on: 01/28/2019 08:14 AM   Modules accepted: Orders

## 2019-02-05 ENCOUNTER — Encounter: Payer: Self-pay | Admitting: *Deleted

## 2019-02-11 ENCOUNTER — Other Ambulatory Visit: Payer: Self-pay

## 2019-02-11 ENCOUNTER — Encounter: Payer: Self-pay | Admitting: Internal Medicine

## 2019-02-11 ENCOUNTER — Ambulatory Visit (INDEPENDENT_AMBULATORY_CARE_PROVIDER_SITE_OTHER): Payer: Medicaid Other | Admitting: Internal Medicine

## 2019-02-11 VITALS — BP 106/72 | HR 98 | Temp 98.3°F | Wt 154.0 lb

## 2019-02-11 DIAGNOSIS — Z3490 Encounter for supervision of normal pregnancy, unspecified, unspecified trimester: Secondary | ICD-10-CM

## 2019-02-11 DIAGNOSIS — Z79899 Other long term (current) drug therapy: Secondary | ICD-10-CM

## 2019-02-11 DIAGNOSIS — B20 Human immunodeficiency virus [HIV] disease: Secondary | ICD-10-CM | POA: Diagnosis not present

## 2019-02-11 NOTE — Progress Notes (Signed)
RFV: follow up for hiv disease and pregnancy  Patient ID: Mary Zhang, female   DOB: 11/13/1990, 28 y.o.   MRN: [id number]  HPI Mary Zhang is a 28yo F with hiv disease, CD 4 count of 429/VL<20 in late may 2020, doing well on triumeq. In her pregnancy @ [redacted]wk gestation. Continues on prenatal vitamins. Has had minimal symptoms from her pregnancy. Doing well overall.  Outpatient Encounter Medications as of 02/11/2019  Medication Sig  . abacavir-dolutegravir-lamiVUDine (TRIUMEQ) 600-50-300 MG tablet Take 1 tablet by mouth daily.  . AMBULATORY NON FORMULARY MEDICATION 1 Device by Other route once a week. Blood Pressure Cuff Medium Monitored Regularly at home ICD 10:Z34.90  . doxylamine, Sleep, (UNISOM) 25 MG tablet Take 12.5 mg (0.5 tablets) every 4-6 hours as needed for nausea and vomiting.  . folic acid (FOLVITE) 1 MG tablet Take 1 tablet (1 mg total) by mouth daily.  . metroNIDAZOLE (FLAGYL) 500 MG tablet Take two tablets by mouth twice a day, for one day.  Or you can take all four tablets at once if you can tolerate it.  . Prenat-Fe Carbonyl-FA-Omega 3 (ONE-A-DAY WOMENS PRENATAL 1) 28-0.8-235 MG CAPS Take 1 tablet by mouth daily.  . Prenatal Vit-Fe Fumarate-FA (PREPLUS) 27-1 MG TABS Take 1 tablet by mouth daily.  . vitamin B-6 (PYRIDOXINE) 25 MG tablet Take 1 tablet by mouth every 4-6 hours as needed for nausea.   No facility-administered encounter medications on file as of 02/11/2019.      Patient Active Problem List   Diagnosis Date Noted  . Trichomonal vaginitis during pregnancy 01/28/2019  . 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 Language barrier 01/22/2019  . History of preterm premature rupture of membranes 01/22/2019  . Gestational thrombocytopenia (HCC) 01/22/2019  . Nausea and vomiting during pregnancy 12/04/2018  . Low blood pressure, not hypotension 01/02/2016  . Lead exposure 12/16/2015  . Raised antibody titer 10/25/2015  . HIV disease (HCC)  09/30/2015  . Supervision of high risk pregnancy, antepartum 09/30/2015   Social History   Tobacco Use  . Smoking status: Never Smoker  . Smokeless tobacco: Never Used  Substance Use Topics  . Alcohol use: No  . Drug use: No    There are no preventive care reminders to display for this patient.   Review of Systems Review of Systems  Constitutional: Negative for fever, chills, diaphoresis, activity change, appetite change, fatigue and unexpected weight change.  HENT: Negative for congestion, sore throat, rhinorrhea, sneezing, trouble swallowing and sinus pressure.  Eyes: Negative for photophobia and visual disturbance.  Respiratory: Negative for cough, chest tightness, shortness of breath, wheezing and stridor.  Cardiovascular: Negative for chest pain, palpitations and leg swelling.  Gastrointestinal: Negative for nausea, vomiting, abdominal pain, diarrhea, constipation, blood in stool, abdominal distention and anal bleeding.  Genitourinary: Negative for dysuria, hematuria, flank pain and difficulty urinating.  Musculoskeletal: Negative for myalgias, back pain, joint swelling, arthralgias and gait problem.  Skin: Negative for color change, pallor, rash and wound.  Neurological: Negative for dizziness, tremors, weakness and light-headedness.  Hematological: Negative for adenopathy. Does not bruise/bleed easily.  Psychiatric/Behavioral: Negative for behavioral problems, confusion, sleep disturbance, dysphoric mood, decreased concentration and agitation.    Physical Exam   BP 106/72   Pulse 98   Temp 98.3 F (36.8 C)   Wt 154 lb (69.9 kg)   LMP 10/30/2018   BMI 28.17 kg/m  Physical Exam  Constitutional:  oriented to person, place, and time. appears well-developed and well-nourished. No  distress.  HENT: [place]/AT, PERRLA, no scleral icterus Mouth/Throat: Oropharynx is clear and moist. No oropharyngeal exudate.  Cardiovascular: Normal rate, regular rhythm and normal heart sounds.  Exam reveals no gallop and no friction rub.  No murmur heard.  Pulmonary/Chest: Effort normal and breath sounds normal. No respiratory distress.  has no wheezes.  Neck = supple, no nuchal rigidity Abdominal: Soft. Bowel sounds are normal.  exhibits no distension. There is no tenderness.  Lymphadenopathy: no cervical adenopathy. No axillary adenopathy Neurological: alert and oriented to person, place, and time.  Skin: Skin is warm and dry. No rash noted. No erythema.  Psychiatric: a normal mood and affect.  behavior is normal.    Lab Results  Component Value Date   CD4TCELL 35 12/04/2018   Lab Results  Component Value Date   CD4TABS 429 12/04/2018   CD4TABS 460 08/26/2018   CD4TABS 530 10/02/2017   Lab Results  Component Value Date   HIV1RNAQUANT <20 NOT DETECTED 12/04/2018   No results found for: HEPBSAB Lab Results  Component Value Date   LABRPR Non Reactive 01/22/2019    CBC Lab Results  Component Value Date   WBC 5.5 01/22/2019   RBC 3.95 01/22/2019   HGB 12.6 01/22/2019   HCT 36.2 01/22/2019   PLT 144 (L) 01/22/2019   MCV 92 01/22/2019   MCH 31.9 01/22/2019   MCHC 34.8 01/22/2019   RDW 13.4 01/22/2019   LYMPHSABS 0.9 01/22/2019   MONOABS 384 02/12/2017   EOSABS 0.0 01/22/2019    BMET Lab Results  Component Value Date   NA 136 01/22/2019   K 3.5 01/22/2019   CL 104 01/22/2019   CO2 20 01/22/2019   GLUCOSE 84 01/22/2019   BUN 4 (L) 01/22/2019   CREATININE 0.53 (L) 01/22/2019   CALCIUM 8.9 01/22/2019   GFRNONAA 131 01/22/2019   GFRAA 152 01/22/2019      Assessment and Plan  hiv disease = continue on triumeq during her remaining of her pregnancy  Second semester pregnancy = continue with prenatal vitamins  Health maintenance = plan to get flu vaccine in the sept  Long term medication management = cr stable

## 2019-02-19 ENCOUNTER — Ambulatory Visit (INDEPENDENT_AMBULATORY_CARE_PROVIDER_SITE_OTHER): Payer: Medicaid Other | Admitting: Obstetrics & Gynecology

## 2019-02-19 ENCOUNTER — Other Ambulatory Visit: Payer: Self-pay

## 2019-02-19 VITALS — BP 106/66 | HR 98 | Temp 98.0°F | Wt 155.1 lb

## 2019-02-19 DIAGNOSIS — O09292 Supervision of pregnancy with other poor reproductive or obstetric history, second trimester: Secondary | ICD-10-CM

## 2019-02-19 DIAGNOSIS — O0990 Supervision of high risk pregnancy, unspecified, unspecified trimester: Secondary | ICD-10-CM

## 2019-02-19 DIAGNOSIS — Z8759 Personal history of other complications of pregnancy, childbirth and the puerperium: Secondary | ICD-10-CM

## 2019-02-19 DIAGNOSIS — O0992 Supervision of high risk pregnancy, unspecified, second trimester: Secondary | ICD-10-CM

## 2019-02-19 DIAGNOSIS — Z789 Other specified health status: Secondary | ICD-10-CM

## 2019-02-19 DIAGNOSIS — O98712 Human immunodeficiency virus [HIV] disease complicating pregnancy, second trimester: Secondary | ICD-10-CM

## 2019-02-19 DIAGNOSIS — Z3A16 16 weeks gestation of pregnancy: Secondary | ICD-10-CM

## 2019-02-19 DIAGNOSIS — O98719 Human immunodeficiency virus [HIV] disease complicating pregnancy, unspecified trimester: Secondary | ICD-10-CM

## 2019-02-19 NOTE — Progress Notes (Signed)
   PRENATAL VISIT NOTE  Subjective:  Mary Zhang is a 28 y.o. G2P0101 (2yo son) at [redacted]w[redacted]d being seen today for ongoing prenatal care.  She is currently monitored for the following issues for this high-risk pregnancy and has HIV disease ([place]); Low blood pressure, not hypotension; Raised antibody titer; Lead exposure; Nausea and vomiting during pregnancy; Supervision of high risk pregnancy, antepartum; HIV infection in mother during pregnancy, antepartum; Language barrier; History of preterm premature rupture of membranes; Gestational thrombocytopenia ([place]); and Trichomonal vaginitis during pregnancy on their problem list.  Patient reports no complaints.  Contractions: Not present. Vag. Bleeding: None.  Movement: Present. Denies leaking of fluid.   The following portions of the patient's history were reviewed and updated as appropriate: allergies, current medications, past family history, past medical history, past social history, past surgical history and problem list.   Objective:   Vitals:   02/19/19 0829  BP: 106/66  Pulse: 98  Temp: 98 F (36.7 C)  Weight: 155 lb 1.6 oz (70.4 kg)    Fetal Status: Fetal Heart Rate (bpm): 148   Movement: Present     General:  Alert, oriented and cooperative. Patient is in no acute distress.  Skin: Skin is warm and dry. No rash noted.   Cardiovascular: Normal heart rate noted  Respiratory: Normal respiratory effort, no problems with respiration noted  Abdomen: Soft, gravid, appropriate for gestational age.  Pain/Pressure: Present     Pelvic: Cervical exam deferred        Extremities: Normal range of motion.  Edema: None  Mental Status: Normal mood and affect. Normal behavior. Normal judgment and thought content.   Assessment and Plan:  Pregnancy: G2P0101 at [redacted]w[redacted]d 1. Supervision of high risk pregnancy, antepartum - anatomy u/s next month  2. HIV infection in mother during pregnancy, antepartum - followed at ID  3. Language barrier - Live  interpretor present  4. History of preterm premature rupture of membranes - says that he was "2 weeks early, normal size baby, no NICU"  Preterm labor symptoms and general obstetric precautions including but not limited to vaginal bleeding, contractions, leaking of fluid and fetal movement were reviewed in detail with the patient. Please refer to After Visit Summary for other counseling recommendations.   No follow-ups on file.  Future Appointments  Date Time Provider [place]  03/10/2019  9:30 AM [place] MFC-US  03/10/2019  9:30 AM WH-MFC [place] 1 WH-MFCUS MFC-US  03/19/2019  8:15 AM [place] Mode, MD WOC-WOCA WOC  04/13/2019 11:00 AM Carlyle Basques, MD RCID-RCID RCID    Emily Filbert, MD

## 2019-03-10 ENCOUNTER — Ambulatory Visit (HOSPITAL_COMMUNITY): Payer: Medicaid Other | Admitting: *Deleted

## 2019-03-10 ENCOUNTER — Ambulatory Visit (HOSPITAL_COMMUNITY): Payer: Medicaid Other

## 2019-03-10 ENCOUNTER — Encounter (HOSPITAL_COMMUNITY): Payer: Self-pay

## 2019-03-10 ENCOUNTER — Other Ambulatory Visit: Payer: Self-pay | Admitting: Obstetrics and Gynecology

## 2019-03-10 ENCOUNTER — Other Ambulatory Visit (HOSPITAL_COMMUNITY): Payer: Self-pay | Admitting: *Deleted

## 2019-03-10 ENCOUNTER — Ambulatory Visit (HOSPITAL_COMMUNITY)
Admission: RE | Admit: 2019-03-10 | Discharge: 2019-03-10 | Disposition: A | Discharge status: Home / Self Care | Payer: Medicaid Other | Source: Ambulatory Visit | Attending: Obstetrics and Gynecology | Admitting: Obstetrics and Gynecology

## 2019-03-10 ENCOUNTER — Other Ambulatory Visit: Payer: Self-pay

## 2019-03-10 DIAGNOSIS — B20 Human immunodeficiency virus [HIV] disease: Secondary | ICD-10-CM | POA: Diagnosis present

## 2019-03-10 DIAGNOSIS — O09212 Supervision of pregnancy with history of pre-term labor, second trimester: Secondary | ICD-10-CM

## 2019-03-10 DIAGNOSIS — O98712 Human immunodeficiency virus [HIV] disease complicating pregnancy, second trimester: Secondary | ICD-10-CM | POA: Diagnosis not present

## 2019-03-10 DIAGNOSIS — O0990 Supervision of high risk pregnancy, unspecified, unspecified trimester: Secondary | ICD-10-CM

## 2019-03-10 DIAGNOSIS — Z3686 Encounter for antenatal screening for cervical length: Secondary | ICD-10-CM | POA: Diagnosis not present

## 2019-03-10 DIAGNOSIS — O350XX0 Maternal care for (suspected) central nervous system malformation in fetus, not applicable or unspecified: Secondary | ICD-10-CM | POA: Insufficient documentation

## 2019-03-10 DIAGNOSIS — O3503X0 Maternal care for (suspected) central nervous system malformation or damage in fetus, choroid plexus cysts, not applicable or unspecified: Secondary | ICD-10-CM

## 2019-03-10 DIAGNOSIS — Z3687 Encounter for antenatal screening for uncertain dates: Secondary | ICD-10-CM

## 2019-03-10 DIAGNOSIS — Z3A20 20 weeks gestation of pregnancy: Secondary | ICD-10-CM

## 2019-03-15 LAB — MATERNIT 21 PLUS CORE, BLOOD
Fetal Fraction: 10
Result (T21): NEGATIVE
Trisomy 13 (Patau syndrome): NEGATIVE
Trisomy 18 (Edwards syndrome): NEGATIVE
Trisomy 21 (Down syndrome): NEGATIVE

## 2019-03-16 NOTE — Telephone Encounter (Signed)
Pt results addressed at appt on 02/19/19

## 2019-03-17 ENCOUNTER — Telehealth (HOSPITAL_COMMUNITY): Payer: Self-pay | Admitting: *Deleted

## 2019-03-17 NOTE — Telephone Encounter (Signed)
TC to patient with help of [place] interpreter # [phone], name and DOB verified.  Negative genetic screening result given.  Pt voiced understanding, no questions.

## 2019-03-18 ENCOUNTER — Telehealth: Payer: Self-pay | Admitting: Obstetrics & Gynecology

## 2019-03-18 NOTE — Telephone Encounter (Signed)
Attempted to contact patient w/ Amharic interpreter ID# [phone] about her appointment on 8/27 @ 8:15. No answer, interpreter left voicemail instructing patient that the appointment is a virtual visit. Patient instructed to download the [facility] app if not already done so. Patient instructed to give the office a call with any concerns.

## 2019-03-19 ENCOUNTER — Ambulatory Visit (INDEPENDENT_AMBULATORY_CARE_PROVIDER_SITE_OTHER): Payer: Medicaid Other | Admitting: Obstetrics & Gynecology

## 2019-03-19 ENCOUNTER — Other Ambulatory Visit: Payer: Self-pay

## 2019-03-19 VITALS — BP 113/83 | HR 92

## 2019-03-19 DIAGNOSIS — O98719 Human immunodeficiency virus [HIV] disease complicating pregnancy, unspecified trimester: Secondary | ICD-10-CM

## 2019-03-19 DIAGNOSIS — O0990 Supervision of high risk pregnancy, unspecified, unspecified trimester: Secondary | ICD-10-CM

## 2019-03-19 DIAGNOSIS — O0992 Supervision of high risk pregnancy, unspecified, second trimester: Secondary | ICD-10-CM | POA: Diagnosis not present

## 2019-03-19 DIAGNOSIS — Z3A21 21 weeks gestation of pregnancy: Secondary | ICD-10-CM

## 2019-03-19 DIAGNOSIS — O98712 Human immunodeficiency virus [HIV] disease complicating pregnancy, second trimester: Secondary | ICD-10-CM | POA: Diagnosis not present

## 2019-03-19 DIAGNOSIS — Z8759 Personal history of other complications of pregnancy, childbirth and the puerperium: Secondary | ICD-10-CM | POA: Diagnosis not present

## 2019-03-19 NOTE — Patient Instructions (Signed)

## 2019-03-19 NOTE — Progress Notes (Signed)
   TELEHEALTH VIRTUAL OBSTETRICS VISIT ENCOUNTER NOTE  I connected with Mary Zhang on 03/19/19 at  8:15 AM EDT by telephone at home and verified that I am speaking with the correct person using two identifiers.   I discussed the limitations, risks, security and privacy concerns of performing an evaluation and management service by telephone and the availability of in person appointments. I also discussed with the patient that there may be a patient responsible charge related to this service. The patient expressed understanding and agreed to proceed.  Subjective:  Mary Zhang is a 28 y.o. G2P0101 at [redacted]w[redacted]d being followed for ongoing prenatal care.  She is currently monitored for the following issues for this high-risk pregnancy and has HIV disease ([place]); Low blood pressure, not hypotension; Raised antibody titer; Lead exposure; Nausea and vomiting during pregnancy; Supervision of high risk pregnancy, antepartum; HIV infection in mother during pregnancy, antepartum; Language barrier; History of preterm premature rupture of membranes; Gestational thrombocytopenia ([place]); and Trichomonal vaginitis during pregnancy on their problem list.  Patient reports no complaints. Reports fetal movement. Denies any contractions, bleeding or leaking of fluid.   The following portions of the patient's history were reviewed and updated as appropriate: allergies, current medications, past family history, past medical history, past social history, past surgical history and problem list.   Objective:   General:  Alert, oriented and cooperative.   Mental Status: Normal mood and affect perceived. Normal judgment and thought content.  Rest of physical exam deferred due to type of encounter  Assessment and Plan:  Pregnancy: G2P0101 at [redacted]w[redacted]d 1. HIV infection in mother during pregnancy, antepartum Has ID f/u in 3 weeks  2. Supervision of high risk pregnancy, antepartum F/u US, dating reviewed  3. History of preterm  premature rupture of membranes F/u cx length, declines 17 P  Preterm labor symptoms and general obstetric precautions including but not limited to vaginal bleeding, contractions, leaking of fluid and fetal movement were reviewed in detail with the patient.  I discussed the assessment and treatment plan with the patient. The patient was provided an opportunity to ask questions and all were answered. The patient agreed with the plan and demonstrated an understanding of the instructions. The patient was advised to call back or seek an in-person office evaluation/go to [facility]U at [facility] for any urgent or concerning symptoms. Please refer to After Visit Summary for other counseling recommendations.   I provided 14 minutes of non-face-to-face time during this encounter.  Return in about 5 weeks (around 04/23/2019) for in person 2 hr GTT.  Future Appointments  Date Time Provider [place]  04/06/2019 11:00 AM WH-MFC [place] 3 WH-MFCUS MFC-US  04/06/2019 11:05 AM [place] NURSE [place] MFC-US  04/13/2019 11:00 AM Carlyle Basques, MD [facility]    Emeterio Reeve, MD [facility] [place]

## 2019-04-06 ENCOUNTER — Ambulatory Visit (HOSPITAL_COMMUNITY): Payer: Medicaid Other | Attending: Obstetrics and Gynecology

## 2019-04-06 ENCOUNTER — Ambulatory Visit (HOSPITAL_COMMUNITY): Payer: Medicaid Other

## 2019-04-06 ENCOUNTER — Encounter (HOSPITAL_COMMUNITY): Payer: Self-pay

## 2019-04-13 ENCOUNTER — Encounter: Payer: Self-pay | Admitting: Internal Medicine

## 2019-04-13 ENCOUNTER — Ambulatory Visit: Payer: Medicaid Other | Admitting: Internal Medicine

## 2019-04-13 ENCOUNTER — Ambulatory Visit (INDEPENDENT_AMBULATORY_CARE_PROVIDER_SITE_OTHER): Payer: Medicaid Other | Admitting: Internal Medicine

## 2019-04-13 ENCOUNTER — Other Ambulatory Visit: Payer: Self-pay

## 2019-04-13 VITALS — BP 95/66 | HR 115 | Temp 98.5°F

## 2019-04-13 DIAGNOSIS — Z3490 Encounter for supervision of normal pregnancy, unspecified, unspecified trimester: Secondary | ICD-10-CM | POA: Diagnosis not present

## 2019-04-13 DIAGNOSIS — B20 Human immunodeficiency virus [HIV] disease: Secondary | ICD-10-CM

## 2019-04-13 DIAGNOSIS — Z23 Encounter for immunization: Secondary | ICD-10-CM

## 2019-04-13 NOTE — Progress Notes (Signed)
RFV: follow up for hiv disease, and pregnancy  Patient ID: Mary Zhang, female   DOB: August 07, 1990, 28 y.o.   MRN: [id number]  HPI Mary Zhang is G2P0101 at [redacted]w[redacted]d, currently taking triumeq. Continues to have excellent adherence as well as tolerating her pregnancy pretty well.  ROS:  She denies having any significant nausea. No GERD, no shortness of breath, no spotting. 12 point ros is otherwise negative  Outpatient Encounter Medications as of 04/13/2019  Medication Sig  . abacavir-dolutegravir-lamiVUDine (TRIUMEQ) 600-50-300 MG tablet Take 1 tablet by mouth daily.  . AMBULATORY NON FORMULARY MEDICATION 1 Device by Other route once a week. Blood Pressure Cuff Medium Monitored Regularly at home ICD 10:Z34.90  . doxylamine, Sleep, (UNISOM) 25 MG tablet Take 12.5 mg (0.5 tablets) every 4-6 hours as needed for nausea and vomiting. (Patient not taking: Reported on 03/10/2019)  . folic acid (FOLVITE) 1 MG tablet Take 1 tablet (1 mg total) by mouth daily. (Patient not taking: Reported on 03/10/2019)  . metroNIDAZOLE (FLAGYL) 500 MG tablet Take two tablets by mouth twice a day, for one day.  Or you can take all four tablets at once if you can tolerate it. (Patient not taking: Reported on 03/10/2019)  . Prenat-Fe Carbonyl-FA-Omega 3 (ONE-A-DAY WOMENS PRENATAL 1) 28-0.8-235 MG CAPS Take 1 tablet by mouth daily. (Patient not taking: Reported on 03/19/2019)  . Prenatal Vit-Fe Fumarate-FA (PREPLUS) 27-1 MG TABS Take 1 tablet by mouth daily. (Patient not taking: Reported on 03/19/2019)  . vitamin B-6 (PYRIDOXINE) 25 MG tablet Take 1 tablet by mouth every 4-6 hours as needed for nausea. (Patient not taking: Reported on 03/10/2019)   No facility-administered encounter medications on file as of 04/13/2019.      Patient Active Problem List   Diagnosis Date Noted  . Trichomonal vaginitis during pregnancy 01/28/2019  . 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 Language barrier 01/22/2019  .  History of preterm premature rupture of membranes 01/22/2019  . Gestational thrombocytopenia ([place]) 01/22/2019  . Nausea and vomiting during pregnancy 12/04/2018  . Low blood pressure, not hypotension 01/02/2016  . Lead exposure 12/16/2015  . Raised antibody titer 10/25/2015  . HIV disease ([place]) 09/30/2015  . Supervision of high risk pregnancy, antepartum 09/30/2015     Health Maintenance Due  Topic Date Due  . INFLUENZA VACCINE  02/21/2019   soc hx : no smoking or etoh or illicit drugs  Review of Systems See hpi Physical Exam   LMP 10/30/2018 (Approximate)   BP 95/66   Pulse (!) 115   Temp 98.5 F (36.9 C) (Oral)   LMP 10/30/2018 (Approximate)  Physical Exam  Constitutional:  oriented to person, place, and time. appears well-developed and well-nourished. No distress.  HENT: [place]/AT, PERRLA, no scleral icterus Mouth/Throat: Oropharynx is clear and moist. No oropharyngeal exudate.  Cardiovascular: Normal rate, regular rhythm and normal heart sounds. Exam reveals no gallop and no friction rub.  No murmur heard.  Pulmonary/Chest: Effort normal and breath sounds normal. No respiratory distress.  has no wheezes.  Neck = supple, no nuchal rigidity Abd: gravid contour. BS normal. There is no tenderness.  Lymphadenopathy: no cervical adenopathy. No axillary adenopathy Neurological: alert and oriented to person, place, and time.  Skin: Skin is warm and dry. No rash noted. No erythema.  Psychiatric: a normal mood and affect.  behavior is normal.   Lab Results  Component Value Date   CD4TCELL 35 12/04/2018   Lab Results  Component Value Date   CD4TABS  429 12/04/2018   CD4TABS 460 08/26/2018   CD4TABS 530 10/02/2017   Lab Results  Component Value Date   HIV1RNAQUANT <20 NOT DETECTED 12/04/2018   No results found for: HEPBSAB Lab Results  Component Value Date   LABRPR Non Reactive 01/22/2019    CBC Lab Results  Component Value Date   WBC 5.5 01/22/2019   RBC 3.95  01/22/2019   HGB 12.6 01/22/2019   HCT 36.2 01/22/2019   PLT 144 (L) 01/22/2019   MCV 92 01/22/2019   MCH 31.9 01/22/2019   MCHC 34.8 01/22/2019   RDW 13.4 01/22/2019   LYMPHSABS 0.9 01/22/2019   MONOABS 384 02/12/2017   EOSABS 0.0 01/22/2019    BMET Lab Results  Component Value Date   NA 136 01/22/2019   K 3.5 01/22/2019   CL 104 01/22/2019   CO2 20 01/22/2019   GLUCOSE 84 01/22/2019   BUN 4 (L) 01/22/2019   CREATININE 0.53 (L) 01/22/2019   CALCIUM 8.9 01/22/2019   GFRNONAA 131 01/22/2019   GFRAA 152 01/22/2019      Assessment and Plan  hiv disease = continue on triumeq. Will plan to check labs to ensure she is undetectable. She states that she has had good adherence  Pregnancy = continue with multivitamin and folate supplementation.  Has u/s in oc 11th   Health maintenance = will give flu shot today as well  Long term medication management = cr is stable.

## 2019-04-15 LAB — HELPER T-LYMPH-CD4 (ARMC ONLY)
% CD 4 Pos. Lymph.: 30.2 % — ABNORMAL LOW (ref 30.8–58.5)
Absolute CD 4 Helper: 453 /uL (ref 359–1519)
Basophils Absolute: 0 10*3/uL (ref 0.0–0.2)
Basos: 0 %
EOS (ABSOLUTE): 0.1 10*3/uL (ref 0.0–0.4)
Eos: 1 %
Hematocrit: 36.1 % (ref 34.0–46.6)
Hemoglobin: 12.2 g/dL (ref 11.1–15.9)
Immature Grans (Abs): 0.2 10*3/uL — ABNORMAL HIGH (ref 0.0–0.1)
Immature Granulocytes: 1 %
Lymphocytes Absolute: 1.5 10*3/uL (ref 0.7–3.1)
Lymphs: 14 %
MCH: 32.5 pg (ref 26.6–33.0)
MCHC: 33.8 g/dL (ref 31.5–35.7)
MCV: 96 fL (ref 79–97)
Monocytes Absolute: 0.5 10*3/uL (ref 0.1–0.9)
Monocytes: 5 %
Neutrophils Absolute: 8.2 10*3/uL — ABNORMAL HIGH (ref 1.4–7.0)
Neutrophils: 79 %
Platelets: 164 10*3/uL (ref 150–450)
RBC: 3.75 x10E6/uL — ABNORMAL LOW (ref 3.77–5.28)
RDW: 13.1 % (ref 11.7–15.4)
WBC: 10.5 10*3/uL (ref 3.4–10.8)

## 2019-04-17 LAB — HIV-1 RNA QUANT-NO REFLEX-BLD
HIV 1 RNA Quant: <20 copies/mL
HIV-1 RNA Quant, Log: <1.3 Log copies/mL

## 2019-04-22 ENCOUNTER — Telehealth: Payer: Self-pay | Admitting: Obstetrics and Gynecology

## 2019-04-22 ENCOUNTER — Other Ambulatory Visit: Payer: Self-pay | Admitting: *Deleted

## 2019-04-22 DIAGNOSIS — O0990 Supervision of high risk pregnancy, unspecified, unspecified trimester: Secondary | ICD-10-CM

## 2019-04-22 DIAGNOSIS — Z8759 Personal history of other complications of pregnancy, childbirth and the puerperium: Secondary | ICD-10-CM

## 2019-04-22 NOTE — Telephone Encounter (Signed)
Attempted to call patient w/ Amharic interpreter ID# [phone] about her appointment on 10/1 @ 8:15. No answer, interpreter left voicemail instructing patient to wear a face mask for the entire appointment and no visitors are allowed during the visit. Patient instructed not to attend the appointment if she was any symptoms. Symptom list and office number left.

## 2019-04-23 ENCOUNTER — Other Ambulatory Visit (HOSPITAL_COMMUNITY)
Admission: RE | Admit: 2019-04-23 | Discharge: 2019-04-23 | Disposition: A | Discharge status: Home / Self Care | Payer: Medicaid Other | Source: Ambulatory Visit | Attending: Obstetrics and Gynecology | Admitting: Obstetrics and Gynecology

## 2019-04-23 ENCOUNTER — Ambulatory Visit (INDEPENDENT_AMBULATORY_CARE_PROVIDER_SITE_OTHER): Payer: Medicaid Other | Admitting: Obstetrics and Gynecology

## 2019-04-23 ENCOUNTER — Other Ambulatory Visit: Payer: Medicaid Other

## 2019-04-23 ENCOUNTER — Encounter: Payer: Self-pay | Admitting: Obstetrics and Gynecology

## 2019-04-23 ENCOUNTER — Other Ambulatory Visit: Payer: Self-pay

## 2019-04-23 VITALS — BP 97/62 | HR 85 | Temp 98.5°F | Wt 158.6 lb

## 2019-04-23 DIAGNOSIS — O99113 Other diseases of the blood and blood-forming organs and certain disorders involving the immune mechanism complicating pregnancy, third trimester: Secondary | ICD-10-CM

## 2019-04-23 DIAGNOSIS — O0990 Supervision of high risk pregnancy, unspecified, unspecified trimester: Secondary | ICD-10-CM | POA: Diagnosis not present

## 2019-04-23 DIAGNOSIS — O98713 Human immunodeficiency virus [HIV] disease complicating pregnancy, third trimester: Secondary | ICD-10-CM

## 2019-04-23 DIAGNOSIS — O09293 Supervision of pregnancy with other poor reproductive or obstetric history, third trimester: Secondary | ICD-10-CM

## 2019-04-23 DIAGNOSIS — A5901 Trichomonal vulvovaginitis: Secondary | ICD-10-CM

## 2019-04-23 DIAGNOSIS — O0993 Supervision of high risk pregnancy, unspecified, third trimester: Secondary | ICD-10-CM

## 2019-04-23 DIAGNOSIS — D696 Thrombocytopenia, unspecified: Secondary | ICD-10-CM

## 2019-04-23 DIAGNOSIS — O98719 Human immunodeficiency virus [HIV] disease complicating pregnancy, unspecified trimester: Secondary | ICD-10-CM

## 2019-04-23 DIAGNOSIS — O23593 Infection of other part of genital tract in pregnancy, third trimester: Secondary | ICD-10-CM | POA: Insufficient documentation

## 2019-04-23 DIAGNOSIS — Z8759 Personal history of other complications of pregnancy, childbirth and the puerperium: Secondary | ICD-10-CM

## 2019-04-23 DIAGNOSIS — Z23 Encounter for immunization: Secondary | ICD-10-CM | POA: Diagnosis not present

## 2019-04-23 DIAGNOSIS — Z789 Other specified health status: Secondary | ICD-10-CM

## 2019-04-23 DIAGNOSIS — Z3A26 26 weeks gestation of pregnancy: Secondary | ICD-10-CM

## 2019-04-23 NOTE — Addendum Note (Signed)
Addended by: Shelly Coss on: 04/23/2019 10:59 AM   Modules accepted: Orders

## 2019-04-23 NOTE — Progress Notes (Signed)
   PRENATAL VISIT NOTE  Subjective:  Mary Zhang is a 28 y.o. G2P0101 at [redacted]w[redacted]d being seen today for ongoing prenatal care.  She is currently monitored for the following issues for this high-risk pregnancy and has HIV disease ([place]); Low blood pressure, not hypotension; Raised antibody titer; Lead exposure; Nausea and vomiting during pregnancy; Supervision of high risk pregnancy, antepartum; HIV infection in mother during pregnancy, antepartum; Language barrier; History of preterm premature rupture of membranes; Gestational thrombocytopenia ([place]); and Trichomonal vaginitis during pregnancy on their problem list.  Patient reports no complaints.  Contractions: Not present. Vag. Bleeding: None.  Movement: Present. Denies leaking of fluid.   The following portions of the patient's history were reviewed and updated as appropriate: allergies, current medications, past family history, past medical history, past social history, past surgical history and problem list.   Objective:   Vitals:   04/23/19 0828  BP: 97/62  Pulse: 85  Temp: 98.5 F (36.9 C)  Weight: 158 lb 9.6 oz (71.9 kg)   Fetal Status: Fetal Heart Rate (bpm): 142   Movement: Present     General:  Alert, oriented and cooperative. Patient is in no acute distress.  Skin: Skin is warm and dry. No rash noted.   Cardiovascular: Normal heart rate noted  -Respiratory: Normal respiratory effort, no problems with respiration noted  Abdomen: Soft, gravid, appropriate for gestational age.  Pain/Pressure: Absent     Pelvic: Cervical exam deferred        Extremities: Normal range of motion.  Edema: None  Mental Status: Normal mood and affect. Normal behavior. Normal judgment and thought content.   Assessment and Plan:  Pregnancy: G2P0101 at [redacted]w[redacted]d  1. Supervision of high risk pregnancy, antepartum - CBC - RPR - 2 hr GTT - Tdap vaccine greater than or equal to 7yo IM  2. HIV infection in mother during pregnancy, antepartum 04/13/19: viral  load undetectable  3. Language barrier Amharic interpretor used  4. History of preterm premature rupture of membranes Declined 17P  5. Benign gestational thrombocytopenia in third trimester ([place]) Repeat CBC today  6. Trichomonal vaginitis during pregnancy in third trimester TOC today   Preterm labor symptoms and general obstetric precautions including but not limited to vaginal bleeding, contractions, leaking of fluid and fetal movement were reviewed in detail with the patient. Please refer to After Visit Summary for other counseling recommendations.   Return in about 2 weeks (around 05/07/2019) for high OB, in person.  Future Appointments  Date Time Provider [place]  04/23/2019  9:30 AM WOC-WOCA LAB WOC-WOCA WOC  06/29/2019 11:00 AM Carlyle Basques, MD [facility]    Sloan Leiter, MD

## 2019-04-24 LAB — RPR: RPR Ser Ql: NONREACTIVE

## 2019-04-24 LAB — GLUCOSE TOLERANCE, 2 HOURS W/ 1HR
Glucose, 1 hour: 124 mg/dL (ref 65–179)
Glucose, 2 hour: 115 mg/dL (ref 65–152)
Glucose, Fasting: 86 mg/dL (ref 65–91)

## 2019-04-24 LAB — CBC
Hematocrit: 32.9 % — ABNORMAL LOW (ref 34.0–46.6)
Hemoglobin: 11.4 g/dL (ref 11.1–15.9)
MCH: 32.7 pg (ref 26.6–33.0)
MCHC: 34.7 g/dL (ref 31.5–35.7)
MCV: 94 fL (ref 79–97)
Platelets: 135 10*3/uL — ABNORMAL LOW (ref 150–450)
RBC: 3.49 x10E6/uL — ABNORMAL LOW (ref 3.77–5.28)
RDW: 13.3 % (ref 11.7–15.4)
WBC: 7.4 10*3/uL (ref 3.4–10.8)

## 2019-04-24 LAB — CERVICOVAGINAL ANCILLARY ONLY
Chlamydia: NEGATIVE
Neisseria Gonorrhea: NEGATIVE
Trichomonas: POSITIVE — AB

## 2019-04-24 LAB — HIV 1/2 AB DIFFERENTIATION
HIV 1 Ab: POSITIVE — AB
HIV 2 Ab: NEGATIVE
NOTE (HIV CONF MULTIP: POSITIVE

## 2019-04-24 LAB — HIV ANTIBODY (ROUTINE TESTING W REFLEX): HIV Screen 4th Generation wRfx: REACTIVE — AB

## 2019-04-27 ENCOUNTER — Ambulatory Visit (HOSPITAL_COMMUNITY)
Admission: RE | Admit: 2019-04-27 | Discharge: 2019-04-27 | Disposition: A | Discharge status: Home / Self Care | Payer: Medicaid Other | Source: Ambulatory Visit | Attending: Obstetrics and Gynecology | Admitting: Obstetrics and Gynecology

## 2019-04-27 ENCOUNTER — Other Ambulatory Visit (HOSPITAL_COMMUNITY): Payer: Self-pay | Admitting: *Deleted

## 2019-04-27 ENCOUNTER — Other Ambulatory Visit: Payer: Self-pay

## 2019-04-27 DIAGNOSIS — D696 Thrombocytopenia, unspecified: Secondary | ICD-10-CM | POA: Insufficient documentation

## 2019-04-27 DIAGNOSIS — O98719 Human immunodeficiency virus [HIV] disease complicating pregnancy, unspecified trimester: Secondary | ICD-10-CM | POA: Diagnosis not present

## 2019-04-27 DIAGNOSIS — Z3687 Encounter for antenatal screening for uncertain dates: Secondary | ICD-10-CM | POA: Diagnosis not present

## 2019-04-27 DIAGNOSIS — O98712 Human immunodeficiency virus [HIV] disease complicating pregnancy, second trimester: Secondary | ICD-10-CM

## 2019-04-27 DIAGNOSIS — Z3A27 27 weeks gestation of pregnancy: Secondary | ICD-10-CM

## 2019-04-27 DIAGNOSIS — O99113 Other diseases of the blood and blood-forming organs and certain disorders involving the immune mechanism complicating pregnancy, third trimester: Secondary | ICD-10-CM | POA: Diagnosis present

## 2019-04-27 DIAGNOSIS — O09212 Supervision of pregnancy with history of pre-term labor, second trimester: Secondary | ICD-10-CM

## 2019-04-27 DIAGNOSIS — Z362 Encounter for other antenatal screening follow-up: Secondary | ICD-10-CM | POA: Diagnosis not present

## 2019-04-27 DIAGNOSIS — Z8759 Personal history of other complications of pregnancy, childbirth and the puerperium: Secondary | ICD-10-CM

## 2019-04-28 MED ORDER — METRONIDAZOLE 500 MG PO TABS: ORAL_TABLET | ORAL | 0 refills | Status: DC

## 2019-04-28 NOTE — Addendum Note (Signed)
Addended by: Vivien Rota on: 04/28/2019 03:38 PM   Modules accepted: Orders

## 2019-04-30 ENCOUNTER — Telehealth: Payer: Self-pay | Admitting: Lactation Services

## 2019-04-30 NOTE — Telephone Encounter (Signed)
Called pt using [facility] # [phone].   Pt did not answer the phone. Left message for pt to call the office for lab results at her earliest convenience.

## 2019-04-30 NOTE — Telephone Encounter (Signed)
Called patient using interpreter. No answer left a detailed message for patient to call us back concerning results.

## 2019-04-30 NOTE — Telephone Encounter (Signed)
-----   Message from Sloan Leiter, MD sent at 04/28/2019  3:38 PM EDT ----- Please let patient know positive for trichomonas, needs to be retreated, flagyl already sent to pharmacy

## 2019-05-01 NOTE — Telephone Encounter (Signed)
Since we have been unable to reach this patient by phone, will send letter.  Mary Zhang

## 2019-05-14 ENCOUNTER — Ambulatory Visit (INDEPENDENT_AMBULATORY_CARE_PROVIDER_SITE_OTHER): Payer: Medicaid Other | Admitting: Obstetrics & Gynecology

## 2019-05-14 ENCOUNTER — Other Ambulatory Visit: Payer: Self-pay

## 2019-05-14 ENCOUNTER — Encounter: Payer: Self-pay | Admitting: Obstetrics & Gynecology

## 2019-05-14 VITALS — BP 102/61 | HR 86 | Temp 98.2°F | Wt 159.1 lb

## 2019-05-14 DIAGNOSIS — O0993 Supervision of high risk pregnancy, unspecified, third trimester: Secondary | ICD-10-CM

## 2019-05-14 DIAGNOSIS — Z8759 Personal history of other complications of pregnancy, childbirth and the puerperium: Secondary | ICD-10-CM

## 2019-05-14 DIAGNOSIS — Z3A29 29 weeks gestation of pregnancy: Secondary | ICD-10-CM

## 2019-05-14 DIAGNOSIS — O98719 Human immunodeficiency virus [HIV] disease complicating pregnancy, unspecified trimester: Secondary | ICD-10-CM

## 2019-05-14 DIAGNOSIS — O98713 Human immunodeficiency virus [HIV] disease complicating pregnancy, third trimester: Secondary | ICD-10-CM

## 2019-05-14 DIAGNOSIS — O0990 Supervision of high risk pregnancy, unspecified, unspecified trimester: Secondary | ICD-10-CM

## 2019-05-14 NOTE — Progress Notes (Signed)
   PRENATAL VISIT NOTE  Subjective:  Mary Zhang is a 28 y.o. G2P0101 at [redacted]w[redacted]d being seen today for ongoing prenatal care.  She is currently monitored for the following issues for this high-risk pregnancy and has HIV disease ([place]); Low blood pressure, not hypotension; Raised antibody titer; Lead exposure; Nausea and vomiting during pregnancy; Supervision of high risk pregnancy, antepartum; HIV infection in mother during pregnancy, antepartum; Language barrier; History of preterm premature rupture of membranes; Gestational thrombocytopenia ([place]); and Trichomonal vaginitis during pregnancy on their problem list.  Patient reports no complaints.  Contractions: Not present. Vag. Bleeding: None.  Movement: Present. Denies leaking of fluid.   The following portions of the patient's history were reviewed and updated as appropriate: allergies, current medications, past family history, past medical history, past social history, past surgical history and problem list.   Objective:   Vitals:   05/14/19 1430  BP: 102/61  Pulse: 86  Temp: 98.2 F (36.8 C)  Weight: 159 lb 1.6 oz (72.2 kg)    Fetal Status: Fetal Heart Rate (bpm): 137 Fundal Height: 30 cm Movement: Present     General:  Alert, oriented and cooperative. Patient is in no acute distress.  Skin: Skin is warm and dry. No rash noted.   Cardiovascular: Normal heart rate noted  Respiratory: Normal respiratory effort, no problems with respiration noted  Abdomen: Soft, gravid, appropriate for gestational age.  Pain/Pressure: Absent        Extremities: Normal range of motion.  Edema: None  Mental Status: Normal mood and affect. Normal behavior. Normal judgment and thought content.   Assessment and Plan:  Pregnancy: G2P0101 at [redacted]w[redacted]d 1. Supervision of high risk pregnancy, antepartum Nl growth Undetectable viral load 2. HIV infection in mother during pregnancy, antepartum   3. History of preterm premature rupture of membranes   Preterm  labor symptoms and general obstetric precautions including but not limited to vaginal bleeding, contractions, leaking of fluid and fetal movement were reviewed in detail with the patient. Please refer to After Visit Summary for other counseling recommendations.   Return in about 2 weeks (around 05/28/2019).  Future Appointments  Date Time Provider [place]  05/25/2019 10:15 AM [place] NURSE [place] MFC-US  05/25/2019 10:15 AM [place] 4 WH-MFCUS MFC-US  06/29/2019 11:00 AM Carlyle Basques, MD [facility]ID-[facility]ID RCID    Emeterio Reeve, MD

## 2019-05-14 NOTE — Patient Instructions (Signed)

## 2019-05-20 ENCOUNTER — Other Ambulatory Visit: Payer: Self-pay | Admitting: Family

## 2019-05-20 DIAGNOSIS — O98711 Human immunodeficiency virus [HIV] disease complicating pregnancy, first trimester: Secondary | ICD-10-CM

## 2019-05-25 ENCOUNTER — Ambulatory Visit (HOSPITAL_COMMUNITY): Payer: Medicaid Other | Admitting: *Deleted

## 2019-05-25 ENCOUNTER — Encounter (HOSPITAL_COMMUNITY): Payer: Self-pay

## 2019-05-25 ENCOUNTER — Ambulatory Visit (HOSPITAL_COMMUNITY)
Admission: RE | Admit: 2019-05-25 | Discharge: 2019-05-25 | Disposition: A | Discharge status: Home / Self Care | Payer: Medicaid Other | Source: Ambulatory Visit | Attending: Obstetrics and Gynecology | Admitting: Obstetrics and Gynecology

## 2019-05-25 ENCOUNTER — Other Ambulatory Visit (HOSPITAL_COMMUNITY): Payer: Self-pay | Admitting: *Deleted

## 2019-05-25 ENCOUNTER — Other Ambulatory Visit: Payer: Self-pay

## 2019-05-25 DIAGNOSIS — Z3A31 31 weeks gestation of pregnancy: Secondary | ICD-10-CM

## 2019-05-25 DIAGNOSIS — Z362 Encounter for other antenatal screening follow-up: Secondary | ICD-10-CM | POA: Diagnosis not present

## 2019-05-25 DIAGNOSIS — O98713 Human immunodeficiency virus [HIV] disease complicating pregnancy, third trimester: Secondary | ICD-10-CM | POA: Diagnosis not present

## 2019-05-25 DIAGNOSIS — O09213 Supervision of pregnancy with history of pre-term labor, third trimester: Secondary | ICD-10-CM | POA: Diagnosis not present

## 2019-05-25 DIAGNOSIS — B20 Human immunodeficiency virus [HIV] disease: Secondary | ICD-10-CM

## 2019-05-25 DIAGNOSIS — O98719 Human immunodeficiency virus [HIV] disease complicating pregnancy, unspecified trimester: Secondary | ICD-10-CM | POA: Insufficient documentation

## 2019-05-25 DIAGNOSIS — O0990 Supervision of high risk pregnancy, unspecified, unspecified trimester: Secondary | ICD-10-CM | POA: Insufficient documentation

## 2019-06-01 ENCOUNTER — Telehealth: Payer: Self-pay | Admitting: Obstetrics & Gynecology

## 2019-06-01 ENCOUNTER — Telehealth: Payer: Self-pay | Admitting: Obstetrics and Gynecology

## 2019-06-01 ENCOUNTER — Encounter: Payer: Self-pay | Admitting: Obstetrics and Gynecology

## 2019-06-01 ENCOUNTER — Encounter: Payer: Medicaid Other | Admitting: Obstetrics and Gynecology

## 2019-06-01 NOTE — Telephone Encounter (Signed)
Patient lives in [place], and would like to transfer to a closer office.

## 2019-06-01 NOTE — Telephone Encounter (Signed)
[place] interpreter call patient to get her rescheduled for her missed appointment. No answer, interpreter left voicemail for patient to give the office a call back to be rescheduled. No show letter mailed.

## 2019-06-02 ENCOUNTER — Ambulatory Visit (INDEPENDENT_AMBULATORY_CARE_PROVIDER_SITE_OTHER): Payer: Medicaid Other | Admitting: Advanced Practice Midwife

## 2019-06-02 ENCOUNTER — Encounter: Payer: Self-pay | Admitting: Advanced Practice Midwife

## 2019-06-02 ENCOUNTER — Other Ambulatory Visit: Payer: Self-pay

## 2019-06-02 VITALS — BP 92/53 | HR 92 | Wt 161.1 lb

## 2019-06-02 DIAGNOSIS — O23593 Infection of other part of genital tract in pregnancy, third trimester: Secondary | ICD-10-CM

## 2019-06-02 DIAGNOSIS — O0993 Supervision of high risk pregnancy, unspecified, third trimester: Secondary | ICD-10-CM

## 2019-06-02 DIAGNOSIS — Z3A32 32 weeks gestation of pregnancy: Secondary | ICD-10-CM

## 2019-06-02 DIAGNOSIS — O0990 Supervision of high risk pregnancy, unspecified, unspecified trimester: Secondary | ICD-10-CM

## 2019-06-02 DIAGNOSIS — A5901 Trichomonal vulvovaginitis: Secondary | ICD-10-CM

## 2019-06-02 NOTE — Progress Notes (Signed)
Language Resources interpreter Leane Platt.

## 2019-06-02 NOTE — Progress Notes (Signed)
   PRENATAL VISIT NOTE  Subjective:  Mary Zhang is a 27 y.o. G2P0101 at [redacted]w[redacted]d being seen today for ongoing prenatal care.  She is currently monitored for the following issues for this high-risk pregnancy and has HIV disease ([place]); Low blood pressure, not hypotension; Raised antibody titer; Lead exposure; Nausea and vomiting during pregnancy; Supervision of high risk pregnancy, antepartum; HIV infection in mother during pregnancy, antepartum; Language barrier; History of preterm premature rupture of membranes; Gestational thrombocytopenia ([place]); and Trichomonal vaginitis during pregnancy on their problem list.  Patient reports no complaints.  Contractions: Not present. Vag. Bleeding: None.  Movement: Present. Denies leaking of fluid.   The following portions of the patient's history were reviewed and updated as appropriate: allergies, current medications, past family history, past medical history, past social history, past surgical history and problem list.   Objective:   Vitals:   06/02/19 0915 06/02/19 0922  BP: (!) 88/55 (!) 92/53  Pulse: 94 92  Weight: 73.1 kg     Fetal Status: Fetal Heart Rate (bpm): 150 Fundal Height: 32 cm Movement: Present     General:  Alert, oriented and cooperative. Patient is in no acute distress.  Skin: Skin is warm and dry. No rash noted.   Cardiovascular: Normal heart rate noted  Respiratory: Normal respiratory effort, no problems with respiration noted  Abdomen: Soft, gravid, appropriate for gestational age.  Pain/Pressure: Absent     Pelvic: Cervical exam deferred        Extremities: Normal range of motion.  Edema: None  Mental Status: Normal mood and affect. Normal behavior. Normal judgment and thought content.   Assessment and Plan:  Pregnancy: G2P0101 at [redacted]w[redacted]d 1. Supervision of high risk pregnancy, antepartum  2.  HIV      Followed by Dr Baxter Flattery 3.  Language Barrier     Inperson interpretor present 4.   Hx PPROM      Declined 17P 5.   Hx  Gestational Thrombocytopenia      Platelets 135 on 04/23/19 6.   Trichomonas       Treated in July       TOC Positive in October        After discussion, states husband was never treated.  I reviewed need for both partners to be treated.  RN got on phone and told him herself "because he will only listen to the doctor".  Expedited partner Therapy Rx provided.  Already has Rx at [facility] from October  Told to call and have them get it ready  Preterm labor symptoms and general obstetric precautions including but not limited to vaginal bleeding, contractions, leaking of fluid and fetal movement were reviewed in detail with the patient. Please refer to After Visit Summary for other counseling recommendations.     Future Appointments  Date Time Provider [place]  06/22/2019  8:15 AM [place] MFC-US  06/22/2019  8:15 AM [place] 4 WH-MFCUS MFC-US  06/29/2019 11:00 AM Carlyle Basques, MD [facility]ID    Hansel Feinstein, CNM

## 2019-06-02 NOTE — Patient Instructions (Signed)
Trichomoniasis Trichomoniasis is an STI (sexually transmitted infection) that can affect both women and men. In women, the outer area of the female genitalia (vulva) and the vagina are affected. In men, mainly the penis is affected, but the prostate and other reproductive organs can also be involved.  This condition can be treated with medicine. It often has no symptoms (is asymptomatic), especially in men. If not treated, trichomoniasis can last for months or years. What are the causes? This condition is caused by a parasite called Trichomonas vaginalis. Trichomoniasis most often spreads from person to person (is contagious) through sexual contact. What increases the risk? The following factors may make you more likely to develop this condition:  Having unprotected sex.  Having sex with a partner who has trichomoniasis.  Having multiple sexual partners.  Having had previous trichomoniasis infections or other STIs. What are the signs or symptoms? In women, symptoms of trichomoniasis include:  Abnormal vaginal discharge that is clear, white, gray, or yellow-green and foamy and has an unusual "fishy" odor.  Itching and irritation of the vagina and vulva.  Burning or pain during urination or sex.  Redness and swelling of the genitals. In men, symptoms of trichomoniasis include:  Penile discharge that may be foamy or contain pus.  Pain in the penis. This may happen only when urinating.  Itching or irritation inside the penis.  Burning after urination or ejaculation. How is this diagnosed? In women, this condition may be found during a routine Pap test or physical exam. It may be found in men during a routine physical exam. Your health care provider may do tests to help diagnose this infection, such as:  Urine tests (men and women).  The following in women: ? Testing the pH of the vagina. ? A vaginal swab test that checks for the Trichomonas vaginalis parasite. ? Testing vaginal  secretions. Your health care provider may test you for other STIs, including HIV (human immunodeficiency virus). How is this treated? This condition is treated with medicine taken by mouth (orally), such as metronidazole or tinidazole, to fight the infection. Your sexual partner(s) also need to be tested and treated.  If you are a woman and you plan to become pregnant or think you may be pregnant, tell your health care provider right away. Some medicines that are used to treat the infection should not be taken during pregnancy. Your health care provider may recommend over-the-counter medicines or creams to help relieve itching or irritation. You may be tested for infection again 3 months after treatment. Follow these instructions at home:  Take and use over-the-counter and prescription medicines, including creams, only as told by your health care provider.  Take your antibiotic medicine as told by your health care provider. Do not stop taking the antibiotic even if you start to feel better.  Do not have sex until 7-10 days after you finish your medicine, or until your health care provider approves. Ask your health care provider when you may start to have sex again.  (Women) Do not douche or wear tampons while you have the infection.  Discuss your infection with your sexual partner(s). Make sure that your partner gets tested and treated, if necessary.  Keep all follow-up visits as told by your health care provider. This is important. How is this prevented?   Use condoms every time you have sex. Using condoms correctly and consistently can help protect against STIs.  Avoid having multiple sexual partners.  Talk with your sexual partner about any   symptoms that either of you may have, as well as any history of STIs.  Get tested for STIs and STDs (sexually transmitted diseases) before you have sex. Ask your partner to do the same.  Do not have sexual contact if you have symptoms of  trichomoniasis or another STI. Contact a health care provider if:  You still have symptoms after you finish your medicine.  You develop pain in your abdomen.  You have pain when you urinate.  You have bleeding after sex.  You develop a rash.  You feel nauseous or you vomit.  You plan to become pregnant or think you may be pregnant. Summary  Trichomoniasis is an STI (sexually transmitted infection) that can affect both women and men.  This condition often has no symptoms (is asymptomatic), especially in men.  Without treatment, this condition can last for months or years.  You should not have sex until 7-10 days after you finish your medicine, or until your health care provider approves. Ask your health care provider when you may start to have sex again.  Discuss your infection with your sexual partner(s). Make sure that your partner gets tested and treated, if necessary. This information is not intended to replace advice given to you by your health care provider. Make sure you discuss any questions you have with your health care provider. Document Released: 01/02/2001 Document Revised: 04/22/2018 Document Reviewed: 04/22/2018 Elsevier Patient Education  2020 Elsevier Inc.  

## 2019-06-22 ENCOUNTER — Encounter (HOSPITAL_COMMUNITY): Payer: Self-pay

## 2019-06-22 ENCOUNTER — Other Ambulatory Visit (HOSPITAL_COMMUNITY): Payer: Self-pay | Admitting: Obstetrics

## 2019-06-22 ENCOUNTER — Ambulatory Visit (HOSPITAL_COMMUNITY)
Admission: RE | Admit: 2019-06-22 | Discharge: 2019-06-22 | Disposition: A | Discharge status: Home / Self Care | Payer: Medicaid Other | Source: Ambulatory Visit | Attending: Obstetrics and Gynecology | Admitting: Obstetrics and Gynecology

## 2019-06-22 ENCOUNTER — Other Ambulatory Visit: Payer: Self-pay

## 2019-06-22 ENCOUNTER — Encounter: Payer: Medicaid Other | Admitting: Obstetrics & Gynecology

## 2019-06-22 ENCOUNTER — Ambulatory Visit (HOSPITAL_COMMUNITY): Payer: Medicaid Other

## 2019-06-22 DIAGNOSIS — Z362 Encounter for other antenatal screening follow-up: Secondary | ICD-10-CM

## 2019-06-22 DIAGNOSIS — B20 Human immunodeficiency virus [HIV] disease: Secondary | ICD-10-CM

## 2019-06-22 DIAGNOSIS — Z20822 Contact with and (suspected) exposure to covid-19: Secondary | ICD-10-CM

## 2019-06-22 NOTE — Progress Notes (Unsigned)
Pt with cough and congestion since Friday.  We will reschedule her ultrasound appointment.  Pt instructed to go for COVID testing.

## 2019-06-23 LAB — NOVEL CORONAVIRUS, NAA: SARS-CoV-2, NAA: NOT DETECTED

## 2019-06-26 ENCOUNTER — Ambulatory Visit (INDEPENDENT_AMBULATORY_CARE_PROVIDER_SITE_OTHER): Payer: Medicaid Other | Admitting: Family Medicine

## 2019-06-26 ENCOUNTER — Other Ambulatory Visit: Payer: Self-pay

## 2019-06-26 ENCOUNTER — Other Ambulatory Visit (HOSPITAL_COMMUNITY)
Admission: RE | Admit: 2019-06-26 | Discharge: 2019-06-26 | Disposition: A | Discharge status: Home / Self Care | Payer: Medicaid Other | Source: Ambulatory Visit | Attending: Family Medicine | Admitting: Family Medicine

## 2019-06-26 VITALS — BP 100/64 | HR 91 | Wt 164.0 lb

## 2019-06-26 DIAGNOSIS — O0990 Supervision of high risk pregnancy, unspecified, unspecified trimester: Secondary | ICD-10-CM

## 2019-06-26 DIAGNOSIS — O99113 Other diseases of the blood and blood-forming organs and certain disorders involving the immune mechanism complicating pregnancy, third trimester: Secondary | ICD-10-CM

## 2019-06-26 DIAGNOSIS — O98719 Human immunodeficiency virus [HIV] disease complicating pregnancy, unspecified trimester: Secondary | ICD-10-CM

## 2019-06-26 DIAGNOSIS — A5901 Trichomonal vulvovaginitis: Secondary | ICD-10-CM

## 2019-06-26 DIAGNOSIS — Z3A35 35 weeks gestation of pregnancy: Secondary | ICD-10-CM

## 2019-06-26 DIAGNOSIS — O98713 Human immunodeficiency virus [HIV] disease complicating pregnancy, third trimester: Secondary | ICD-10-CM

## 2019-06-26 DIAGNOSIS — D696 Thrombocytopenia, unspecified: Secondary | ICD-10-CM

## 2019-06-26 DIAGNOSIS — O23593 Infection of other part of genital tract in pregnancy, third trimester: Secondary | ICD-10-CM

## 2019-06-26 DIAGNOSIS — Z789 Other specified health status: Secondary | ICD-10-CM

## 2019-06-26 DIAGNOSIS — O0993 Supervision of high risk pregnancy, unspecified, third trimester: Secondary | ICD-10-CM

## 2019-06-26 NOTE — Progress Notes (Signed)
   PRENATAL VISIT NOTE  Subjective:  Mary Zhang is a 28 y.o. G2P0101 at [redacted]w[redacted]d being seen today for ongoing prenatal care.  She is currently monitored for the following issues for this high-risk pregnancy and has HIV disease ([place]); Low blood pressure, not hypotension; Raised antibody titer; Lead exposure; Nausea and vomiting during pregnancy; Supervision of high risk pregnancy, antepartum; HIV infection in mother during pregnancy, antepartum; Language barrier; History of preterm premature rupture of membranes; Gestational thrombocytopenia ([place]); and Trichomonal vaginitis during pregnancy on their problem list.  Patient reports no complaints.  Contractions: Not present. Vag. Bleeding: None.  Movement: Present. Denies leaking of fluid.   The following portions of the patient's history were reviewed and updated as appropriate: allergies, current medications, past family history, past medical history, past social history, past surgical history and problem list.   Objective:   Vitals:   06/26/19 1057  BP: 100/64  Pulse: 91  Weight: 164 lb (74.4 kg)    Fetal Status: Fetal Heart Rate (bpm): 145   Movement: Present     General:  Alert, oriented and cooperative. Patient is in no acute distress.  Skin: Skin is warm and dry. No rash noted.   Cardiovascular: Normal heart rate noted  Respiratory: Normal respiratory effort, no problems with respiration noted  Abdomen: Soft, gravid, appropriate for gestational age.  Pain/Pressure: Absent     Pelvic: Cervical exam deferred        Extremities: Normal range of motion.  Edema: None  Mental Status: Normal mood and affect. Normal behavior. Normal judgment and thought content.   Assessment and Plan:  Pregnancy: G2P0101 at [redacted]w[redacted]d 1. Supervision of high risk pregnancy, antepartum FHT and FH normal - Culture, beta strep (group b only) - Cervicovaginal ancillary only( [place])  2. HIV infection in mother during pregnancy, antepartum Needs [place] - will  shedule  3. Trichomonal vaginitis during pregnancy in third trimester TOC today  4. Benign gestational thrombocytopenia in third trimester ([place]) Recheck plts next appt.  5. Language barrier Interpreter used.  Preterm labor symptoms and general obstetric precautions including but not limited to vaginal bleeding, contractions, leaking of fluid and fetal movement were reviewed in detail with the patient. Please refer to After Visit Summary for other counseling recommendations.   Return in about 1 week (around 07/03/2019) for OB f/u, In Office.  Future Appointments  Date Time Provider [place]  06/29/2019 11:00 AM Carlyle Basques, MD [facility]  07/03/2019  8:15 AM Lavonia Drafts, MD [facility] None  07/13/2019 11:00 AM [facility] [place] 3 [facility]-MFCUS MFC-US    Malani Lees J Chinmay Squier, DO

## 2019-06-29 ENCOUNTER — Ambulatory Visit (INDEPENDENT_AMBULATORY_CARE_PROVIDER_SITE_OTHER): Payer: Medicaid Other | Admitting: Internal Medicine

## 2019-06-29 ENCOUNTER — Encounter: Payer: Self-pay | Admitting: Internal Medicine

## 2019-06-29 ENCOUNTER — Other Ambulatory Visit: Payer: Self-pay

## 2019-06-29 VITALS — BP 98/63 | HR 98 | Temp 98.3°F | Ht 62.0 in | Wt 165.3 lb

## 2019-06-29 DIAGNOSIS — K219 Gastro-esophageal reflux disease without esophagitis: Secondary | ICD-10-CM

## 2019-06-29 DIAGNOSIS — Z79899 Other long term (current) drug therapy: Secondary | ICD-10-CM

## 2019-06-29 DIAGNOSIS — Z3490 Encounter for supervision of normal pregnancy, unspecified, unspecified trimester: Secondary | ICD-10-CM

## 2019-06-29 DIAGNOSIS — B20 Human immunodeficiency virus [HIV] disease: Secondary | ICD-10-CM

## 2019-06-29 NOTE — Progress Notes (Signed)
RFV: follow up for hiv disease and pregnancy  Patient ID: Mary Zhang, female   DOB: September 20, 1990, 28 y.o.   MRN: [id number]  HPI Mary Zhang is a 28yo F with well controlled hiv disease with G2P1001, at [redacted]w[redacted]d. Seen last week by dr Adrian Blackwater getting U/S. Hx of trichomonas on 10/1 - she is not sexually active other than with her husband. Had URI last month and was tested for covid which was negative.   Continues to take triumeq and prenatal vitamins.   She has GERD+ no longer any N/V. Baby moving  Outpatient Encounter Medications as of 06/29/2019  Medication Sig  . AMBULATORY NON FORMULARY MEDICATION 1 Device by Other route once a week. Blood Pressure Cuff Medium Monitored Regularly at home ICD 10:Z34.90  . folic acid (FOLVITE) 1 MG tablet Take 1 tablet (1 mg total) by mouth daily.  . Prenat-Fe Carbonyl-FA-Omega 3 (ONE-A-DAY WOMENS PRENATAL 1) 28-0.8-235 MG CAPS Take 1 tablet by mouth daily.  . Prenat-FeFum-FePo-FA-Omega 3 (CONCEPT DHA PO) Take by mouth.  . TRIUMEQ 600-50-300 MG tablet TAKE 1 TABLET BY MOUTH DAILY  . doxylamine, Sleep, (UNISOM) 25 MG tablet Take 12.5 mg (0.5 tablets) every 4-6 hours as needed for nausea and vomiting. (Patient not taking: Reported on 04/23/2019)  . vitamin B-6 (PYRIDOXINE) 25 MG tablet Take 1 tablet by mouth every 4-6 hours as needed for nausea. (Patient not taking: Reported on 03/10/2019)   No facility-administered encounter medications on file as of 06/29/2019.      Patient Active Problem List   Diagnosis Date Noted  . Trichomonal vaginitis during pregnancy 01/28/2019  . 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 Language barrier 01/22/2019  . History of preterm premature rupture of membranes 01/22/2019  . Gestational thrombocytopenia (HCC) 01/22/2019  . Nausea and vomiting during pregnancy 12/04/2018  . Low blood pressure, not hypotension 01/02/2016  . Lead exposure 12/16/2015  . Raised antibody titer 10/25/2015  . HIV disease  (HCC) 09/30/2015  . Supervision of high risk pregnancy, antepartum 09/30/2015   Social History   Tobacco Use  . Smoking status: Never Smoker  . Smokeless tobacco: Never Used  Substance Use Topics  . Alcohol use: No  . Drug use: No    There are no preventive care reminders to display for this patient.   Review of Systems +GERD otherwise 12 point ros is negative Physical Exam   BP 98/63   Pulse 98   Temp 98.3 F (36.8 C) (Oral)   Ht 5\' 2"  (1.575 m)   Wt 165 lb 5.5 oz (75 kg)   LMP 10/30/2018 (Approximate)   SpO2 98%   BMI 30.24 kg/m   Physical Exam  Constitutional:  oriented to person, place, and time. appears well-developed and well-nourished. No distress.  HENT: [place]/AT, PERRLA, no scleral icterus Mouth/Throat: Oropharynx is clear and moist. No oropharyngeal exudate.  Cardiovascular: Normal rate, regular rhythm and normal heart sounds. Exam reveals no gallop and no friction rub.  No murmur heard.  Pulmonary/Chest: Effort normal and breath sounds normal. No respiratory distress.  has no wheezes.  Neck = supple, no nuchal rigidity Abdominal: Soft. Bowel sounds are normal.  exhibits no distension. There is no tenderness.  Lymphadenopathy: no cervical adenopathy. No axillary adenopathy Neurological: alert and oriented to person, place, and time.  Skin: Skin is warm and dry. No rash noted. No erythema.  Psychiatric: a normal mood and affect.  behavior is normal.   Lab Results  Component Value  Date   CD4TCELL 35 12/04/2018   Lab Results  Component Value Date   CD4TABS 429 12/04/2018   CD4TABS 460 08/26/2018   CD4TABS 530 10/02/2017   Lab Results  Component Value Date   HIV1RNAQUANT <20 NOT DETECTED 04/13/2019   No results found for: HEPBSAB Lab Results  Component Value Date   LABRPR Non Reactive 04/23/2019    CBC Lab Results  Component Value Date   WBC 7.4 04/23/2019   RBC 3.49 (L) 04/23/2019   HGB 11.4 04/23/2019   HCT 32.9 (L) 04/23/2019   PLT 135 (L)  04/23/2019   MCV 94 04/23/2019   MCH 32.7 04/23/2019   MCHC 34.7 04/23/2019   RDW 13.3 04/23/2019   LYMPHSABS 1.5 04/13/2019   MONOABS 384 02/12/2017   EOSABS 0.1 04/13/2019    BMET Lab Results  Component Value Date   NA 136 01/22/2019   K 3.5 01/22/2019   CL 104 01/22/2019   CO2 20 01/22/2019   GLUCOSE 84 01/22/2019   BUN 4 (L) 01/22/2019   CREATININE 0.53 (L) 01/22/2019   CALCIUM 8.9 01/22/2019   GFRNONAA 131 01/22/2019   GFRAA 152 01/22/2019      Assessment and Plan  hiv disease = well controlled. Will check VL today to ensure she is suppressed. Discussed that she can not do any breastfeeding. Still too risky  Health maintenance = has had flu shot  Continue with masking staying away from others.  Pregnancy = continue with mutlivtiamin  GERD = can use tums

## 2019-06-30 LAB — CERVICOVAGINAL ANCILLARY ONLY
Chlamydia: NEGATIVE
Comment: NEGATIVE
Comment: NEGATIVE
Comment: NORMAL
Neisseria Gonorrhea: NEGATIVE
Trichomonas: NEGATIVE

## 2019-06-30 LAB — CULTURE, BETA STREP (GROUP B ONLY): Strep Gp B Culture: NEGATIVE

## 2019-07-03 ENCOUNTER — Other Ambulatory Visit (HOSPITAL_COMMUNITY)
Admission: RE | Admit: 2019-07-03 | Discharge: 2019-07-03 | Disposition: A | Discharge status: Home / Self Care | Payer: Medicaid Other | Source: Ambulatory Visit | Attending: Obstetrics & Gynecology | Admitting: Obstetrics & Gynecology

## 2019-07-03 ENCOUNTER — Encounter: Payer: Self-pay | Admitting: Obstetrics & Gynecology

## 2019-07-03 ENCOUNTER — Other Ambulatory Visit: Payer: Self-pay

## 2019-07-03 ENCOUNTER — Ambulatory Visit (INDEPENDENT_AMBULATORY_CARE_PROVIDER_SITE_OTHER): Payer: Medicaid Other | Admitting: Obstetrics & Gynecology

## 2019-07-03 VITALS — BP 96/59 | HR 91 | Wt 164.0 lb

## 2019-07-03 DIAGNOSIS — Z8759 Personal history of other complications of pregnancy, childbirth and the puerperium: Secondary | ICD-10-CM

## 2019-07-03 DIAGNOSIS — O0990 Supervision of high risk pregnancy, unspecified, unspecified trimester: Secondary | ICD-10-CM | POA: Diagnosis present

## 2019-07-03 DIAGNOSIS — Z789 Other specified health status: Secondary | ICD-10-CM

## 2019-07-03 DIAGNOSIS — D696 Thrombocytopenia, unspecified: Secondary | ICD-10-CM

## 2019-07-03 DIAGNOSIS — O0993 Supervision of high risk pregnancy, unspecified, third trimester: Secondary | ICD-10-CM

## 2019-07-03 DIAGNOSIS — Z3A36 36 weeks gestation of pregnancy: Secondary | ICD-10-CM

## 2019-07-03 DIAGNOSIS — O98719 Human immunodeficiency virus [HIV] disease complicating pregnancy, unspecified trimester: Secondary | ICD-10-CM

## 2019-07-03 DIAGNOSIS — O98713 Human immunodeficiency virus [HIV] disease complicating pregnancy, third trimester: Secondary | ICD-10-CM

## 2019-07-03 DIAGNOSIS — O99113 Other diseases of the blood and blood-forming organs and certain disorders involving the immune mechanism complicating pregnancy, third trimester: Secondary | ICD-10-CM

## 2019-07-03 NOTE — Progress Notes (Signed)
   PRENATAL VISIT NOTE  Subjective:  Mary Zhang is a 28 y.o. G2P0101 at [redacted]w[redacted]d being seen today for ongoing prenatal care.  She is currently monitored for the following issues for this high-risk pregnancy and has HIV disease ([place]); Low blood pressure, not hypotension; Raised antibody titer; Lead exposure; Supervision of high risk pregnancy, antepartum; HIV infection in mother during pregnancy, antepartum; Language barrier; History of preterm premature rupture of membranes; Gestational thrombocytopenia ([place]); and Trichomonal vaginitis during pregnancy on their problem list.  Patient reports no complaints.  Contractions: Not present. Vag. Bleeding: None.  Movement: Present. Denies leaking of fluid.   The following portions of the patient's history were reviewed and updated as appropriate: allergies, current medications, past family history, past medical history, past social history, past surgical history and problem list.   Objective:   Vitals:   07/03/19 0815  BP: (!) 96/59  Pulse: 91  Weight: 164 lb (74.4 kg)    Fetal Status:     Movement: Present     General:  Alert, oriented and cooperative. Patient is in no acute distress.  Skin: Skin is warm and dry. No rash noted.   Cardiovascular: Normal heart rate noted  Respiratory: Normal respiratory effort, no problems with respiration noted  Abdomen: Soft, gravid, appropriate for gestational age.  Pain/Pressure: Absent     Pelvic: Cervical exam performed        Extremities: Normal range of motion.  Edema: None  Mental Status: Normal mood and affect. Normal behavior. Normal judgment and thought content.   Assessment and Plan:  Pregnancy: G2P0101 at [redacted]w[redacted]d 1. Supervision of high risk pregnancy, antepartum Good FM FH WNL  2. Language barrier [employer].  Interpreter used for enitre visit (xcept when cx were performed).   3. HIV infection in mother during pregnancy, antepartum Pt followed by Dr. Baxter Flattery.   4. History of preterm  premature rupture of membranes  5. Benign gestational thrombocytopenia in third trimester ([place]) CBC ordered today   Preterm labor symptoms and general obstetric precautions including but not limited to vaginal bleeding, contractions, leaking of fluid and fetal movement were reviewed in detail with the patient. Please refer to After Visit Summary for other counseling recommendations.   No follow-ups on file.  Future Appointments  Date Time Provider [place]  07/13/2019 11:00 AM [facility] [place] 3 [facility]-MFCUS MFC-US  10/07/2019 11:00 AM Carlyle Basques, MD [facility]    Lavonia Drafts, MD

## 2019-07-03 NOTE — Addendum Note (Signed)
Addended by: Phill Myron on: 07/03/2019 09:00 AM   Modules accepted: Orders

## 2019-07-04 LAB — CBC
Hematocrit: 40.6 % (ref 34.0–46.6)
Hemoglobin: 13.7 g/dL (ref 11.1–15.9)
MCH: 31.3 pg (ref 26.6–33.0)
MCHC: 33.7 g/dL (ref 31.5–35.7)
MCV: 93 fL (ref 79–97)
Platelets: 135 10*3/uL — ABNORMAL LOW (ref 150–450)
RBC: 4.38 x10E6/uL (ref 3.77–5.28)
RDW: 13.2 % (ref 11.7–15.4)
WBC: 8.3 10*3/uL (ref 3.4–10.8)

## 2019-07-06 LAB — HIV-1 RNA QUANT-NO REFLEX-BLD
HIV 1 RNA Quant: <20 copies/mL
HIV-1 RNA Quant, Log: <1.3 Log copies/mL

## 2019-07-06 LAB — GC/CHLAMYDIA PROBE AMP (~~LOC~~) NOT AT ARMC
Chlamydia: NEGATIVE
Comment: NEGATIVE
Comment: NORMAL
Neisseria Gonorrhea: NEGATIVE

## 2019-07-07 LAB — CULTURE, BETA STREP (GROUP B ONLY): Strep Gp B Culture: NEGATIVE

## 2019-07-09 ENCOUNTER — Encounter (HOSPITAL_COMMUNITY): Payer: Self-pay | Admitting: Obstetrics & Gynecology

## 2019-07-09 ENCOUNTER — Other Ambulatory Visit: Payer: Self-pay

## 2019-07-09 ENCOUNTER — Telehealth: Payer: Self-pay

## 2019-07-09 ENCOUNTER — Inpatient Hospital Stay (HOSPITAL_COMMUNITY)
Admission: EM | Admit: 2019-07-09 | Discharge: 2019-07-11 | DRG: 806 | Disposition: A | Discharge status: Home / Self Care | Payer: Medicaid Other | Attending: Obstetrics and Gynecology | Admitting: Obstetrics and Gynecology

## 2019-07-09 DIAGNOSIS — O9872 Human immunodeficiency virus [HIV] disease complicating childbirth: Secondary | ICD-10-CM | POA: Diagnosis present

## 2019-07-09 DIAGNOSIS — Z3A37 37 weeks gestation of pregnancy: Secondary | ICD-10-CM

## 2019-07-09 DIAGNOSIS — Z21 Asymptomatic human immunodeficiency virus [HIV] infection status: Secondary | ICD-10-CM | POA: Diagnosis present

## 2019-07-09 DIAGNOSIS — Z20828 Contact with and (suspected) exposure to other viral communicable diseases: Secondary | ICD-10-CM | POA: Diagnosis present

## 2019-07-09 DIAGNOSIS — D6959 Other secondary thrombocytopenia: Secondary | ICD-10-CM | POA: Diagnosis present

## 2019-07-09 DIAGNOSIS — O0990 Supervision of high risk pregnancy, unspecified, unspecified trimester: Secondary | ICD-10-CM

## 2019-07-09 DIAGNOSIS — O26893 Other specified pregnancy related conditions, third trimester: Secondary | ICD-10-CM | POA: Diagnosis present

## 2019-07-09 DIAGNOSIS — O9912 Other diseases of the blood and blood-forming organs and certain disorders involving the immune mechanism complicating childbirth: Secondary | ICD-10-CM | POA: Diagnosis present

## 2019-07-09 LAB — CBC
HCT: 38.1 % (ref 36.0–46.0)
Hemoglobin: 13.6 g/dL (ref 12.0–15.0)
MCH: 32.4 pg (ref 26.0–34.0)
MCHC: 35.7 g/dL (ref 30.0–36.0)
MCV: 90.7 fL (ref 80.0–100.0)
Platelets: 142 10*3/uL — ABNORMAL LOW (ref 150–400)
RBC: 4.2 MIL/uL (ref 3.87–5.11)
RDW: 12.7 % (ref 11.5–15.5)
WBC: 9.5 10*3/uL (ref 4.0–10.5)
nRBC: 0 % (ref 0.0–0.2)

## 2019-07-09 LAB — TYPE AND SCREEN
ABO/RH(D): A POS
Antibody Screen: NEGATIVE

## 2019-07-09 LAB — WET PREP, GENITAL
Clue Cells Wet Prep HPF POC: NONE SEEN
Sperm: NONE SEEN
Trich, Wet Prep: NONE SEEN
Yeast Wet Prep HPF POC: NONE SEEN

## 2019-07-09 LAB — AMNISURE RUPTURE OF MEMBRANE (ROM) NOT AT ARMC: Amnisure ROM: POSITIVE

## 2019-07-09 LAB — ABO/RH: ABO/RH(D): A POS

## 2019-07-09 LAB — SARS CORONAVIRUS 2 (TAT 6-24 HRS): SARS Coronavirus 2: NEGATIVE

## 2019-07-09 MED ORDER — ZIDOVUDINE 10 MG/ML IV SOLN
2.0000 mg/kg | Freq: Once | INTRAVENOUS | Status: AC
  Administered 2019-07-09: 149 mg via INTRAVENOUS
  Filled 2019-07-09: qty 14.9

## 2019-07-09 MED ORDER — FENTANYL CITRATE (PF) 100 MCG/2ML IJ SOLN
INTRAMUSCULAR | Status: AC
  Administered 2019-07-09: 50 ug
  Filled 2019-07-09: qty 2

## 2019-07-09 MED ORDER — MISOPROSTOL 50MCG HALF TABLET
50.0000 ug | ORAL_TABLET | ORAL | Status: DC | PRN
  Administered 2019-07-09 (×2): 50 ug via BUCCAL
  Filled 2019-07-09 (×2): qty 1

## 2019-07-09 MED ORDER — ACETAMINOPHEN 325 MG PO TABS: 650.0000 mg | ORAL_TABLET | ORAL | Status: DC | PRN

## 2019-07-09 MED ORDER — OXYTOCIN 40 UNITS IN NORMAL SALINE INFUSION - SIMPLE MED
2.5000 [IU]/h | INTRAVENOUS | Status: DC
  Filled 2019-07-09: qty 1000

## 2019-07-09 MED ORDER — ABACAVIR-DOLUTEGRAVIR-LAMIVUD 600-50-300 MG PO TABS
1.0000 | ORAL_TABLET | Freq: Every day | ORAL | Status: DC
  Administered 2019-07-10 – 2019-07-11 (×2): 1 via ORAL
  Filled 2019-07-09 (×2): qty 1

## 2019-07-09 MED ORDER — TERBUTALINE SULFATE 1 MG/ML IJ SOLN: 0.2500 mg | Freq: Once | INTRAMUSCULAR | Status: DC | PRN

## 2019-07-09 MED ORDER — ONDANSETRON HCL 4 MG/2ML IJ SOLN
4.0000 mg | Freq: Four times a day (QID) | INTRAMUSCULAR | Status: DC | PRN
  Administered 2019-07-09: 4 mg via INTRAVENOUS
  Filled 2019-07-09: qty 2

## 2019-07-09 MED ORDER — SOD CITRATE-CITRIC ACID 500-334 MG/5ML PO SOLN: 30.0000 mL | ORAL | Status: DC | PRN

## 2019-07-09 MED ORDER — LACTATED RINGERS IV SOLN: 500.0000 mL | INTRAVENOUS | Status: DC | PRN

## 2019-07-09 MED ORDER — ZIDOVUDINE 10 MG/ML IV SOLN
1.0000 mg/kg/h | INTRAVENOUS | Status: DC
  Administered 2019-07-09: 1 mg/kg/h via INTRAVENOUS
  Filled 2019-07-09 (×3): qty 40

## 2019-07-09 MED ORDER — LACTATED RINGERS IV SOLN: INTRAVENOUS | Status: DC

## 2019-07-09 MED ORDER — OXYCODONE-ACETAMINOPHEN 5-325 MG PO TABS: 1.0000 | ORAL_TABLET | ORAL | Status: DC | PRN

## 2019-07-09 MED ORDER — OXYTOCIN BOLUS FROM INFUSION: 500.0000 mL | Freq: Once | INTRAVENOUS | Status: DC

## 2019-07-09 MED ORDER — LIDOCAINE HCL (PF) 1 % IJ SOLN
30.0000 mL | INTRAMUSCULAR | Status: DC | PRN
  Filled 2019-07-09: qty 30

## 2019-07-09 MED ORDER — OXYCODONE-ACETAMINOPHEN 5-325 MG PO TABS: 2.0000 | ORAL_TABLET | ORAL | Status: DC | PRN

## 2019-07-09 MED ORDER — TRANEXAMIC ACID-NACL 1000-0.7 MG/100ML-% IV SOLN: 1000.0000 mg | INTRAVENOUS | Status: AC

## 2019-07-09 MED ORDER — TRANEXAMIC ACID-NACL 1000-0.7 MG/100ML-% IV SOLN
INTRAVENOUS | Status: AC
  Administered 2019-07-09: 1000 mg
  Filled 2019-07-09: qty 100

## 2019-07-09 NOTE — Progress Notes (Signed)
Mary Zhang is a 28 y.o. G2P0101 at [redacted]w[redacted]d by ultrasound admitted for SROM.  Subjective: Patient reports not feeling any contractions at this time and has no complaints. [name] present and supportive at bedside.  Objective: Vitals:   07/09/19 1435 07/09/19 1436 07/09/19 1615 07/09/19 1730  BP:  114/66 (!) 106/57   Pulse:  98 86   Resp:  18 18   Temp: 98.8 F (37.1 C)   98.7 F (37.1 C)  TempSrc: Oral   Oral  SpO2:        No intake/output data recorded. No intake/output data recorded.   FHT:  FHR: 140 bpm, variability: moderate,  accelerations:  Present,  decelerations:  Absent UC:   regular, every 2-5 minutes SVE:   Dilation: Fingertip Effacement (%): Thick Station: -3 Exam by:: Wess Botts RNC  Labs:   Recent Labs    07/09/19 1409  WBC 9.5  HGB 13.6  HCT 38.1  PLT 142*    Assessment / Plan: 28 y.o. G2P0101 at [redacted]w[redacted]d by ultrasound admitted for SROM  Labor: Progressing normally following Cytotec x1 - Plan to reassess at next SVE for another Cytotec. Preeclampsia:  no signs or symptoms of toxicity and labs stable Fetal Wellbeing:  Category I Pain Control:  Labor support without medications - Considering epidural I/D:  GBS negative Anticipated MOD:  NSVD  Juanna Cao, SNM, BSN 07/09/2019, 7:30 PM

## 2019-07-09 NOTE — H&P (Addendum)
OBSTETRIC ADMISSION HISTORY AND PHYSICAL  Mary Zhang is a 28 y.o. female G68P0101 with IUP at 24w5dpresenting for SROM. She reports +FMs. No LOF, VB, blurry vision, headaches, peripheral edema, or RUQ pain. She plans on breastfeeding. She is undecided on postpartum contraception at this time.  Dating: By Ultrasound --->  Estimated Date of Delivery: 07/25/19  Sono:    '@[redacted]w[redacted]d'$ , CWD, normal anatomy, cephalic presentation, 29629B 84%ile, EFW 4lb 8oz   Past Medical History: Past Medical History:  Diagnosis Date   HIV disease ([place] 09/30/2015   Low blood pressure, not hypotension 01/02/2016   Menorrhagia    Shingles     Past Surgical History: Past Surgical History:  Procedure Laterality Date   NO PAST SURGERIES      Obstetrical History: OB History     Gravida  2   Para  1   Term  0   Preterm  1   AB      Living  1      SAB      TAB      Ectopic      Multiple      Live Births  1           Social History: Social History   Socioeconomic History   Marital status: Married    Spouse name: Not on file   Number of children: Not on file   Years of education: Not on file   Highest education level: Not on file  Occupational History   Not on file  Tobacco Use   Smoking status: Never Smoker   Smokeless tobacco: Never Used  Substance and Sexual Activity   Alcohol use: No   Drug use: No   Sexual activity: Yes    Partners: Male    Birth control/protection: None    Comment: declined condoms  Other Topics Concern   Not on file  Social History Narrative   Not on file   Social Determinants of Health   Financial Resource Strain:    Difficulty of Paying Living Expenses: Not on file  Food Insecurity: No Food Insecurity   Worried About Running Out of Food in the Last Year: Never true   Ran Out of Food in the Last Year: Never true  Transportation Needs: No Transportation Needs   Lack of Transportation (Medical): No   Lack of Transportation (Non-Medical): No   Physical Activity:    Days of Exercise per Week: Not on file   Minutes of Exercise per Session: Not on file  Stress:    Feeling of Stress : Not on file  Social Connections:    Frequency of Communication with Friends and Family: Not on file   Frequency of Social Gatherings with Friends and Family: Not on file   Attends Religious Services: Not on file   Active Member of Clubs or Organizations: Not on file   Attends CArchivistMeetings: Not on file   Marital Status: Not on file    Family History: History reviewed. No pertinent family history.  Allergies: No Known Allergies  Medications Prior to Admission  Medication Sig Dispense Refill Last Dose   folic acid (FOLVITE) 1 MG tablet Take 1 tablet (1 mg total) by mouth daily. 30 tablet 10 Past Month at Unknown time   Prenat-Fe Carbonyl-FA-Omega 3 (ONE-A-DAY WOMENS PRENATAL 1) 28-0.8-235 MG CAPS Take 1 tablet by mouth daily. 30 capsule 2 Past Month at Unknown time   TRIUMEQ 600-50-300 MG tablet TAKE 1 TABLET BY MOUTH  DAILY 30 tablet 5 07/09/2019 at Unknown time   AMBULATORY NON FORMULARY MEDICATION 1 Device by Other route once a week. Blood Pressure Cuff Medium Monitored Regularly at home ICD 10:Z34.90 1 kit 0    doxylamine, Sleep, (UNISOM) 25 MG tablet Take 12.5 mg (0.5 tablets) every 4-6 hours as needed for nausea and vomiting. (Patient not taking: Reported on 04/23/2019) 30 tablet 0    Prenat-FeFum-FePo-FA-Omega 3 (CONCEPT DHA PO) Take by mouth.      vitamin B-6 (PYRIDOXINE) 25 MG tablet Take 1 tablet by mouth every 4-6 hours as needed for nausea. (Patient not taking: Reported on 03/10/2019) 100 tablet 0      Review of Systems   All systems reviewed and negative except as stated in HPI  Blood pressure 114/66, pulse 98, temperature 98.8 F (37.1 C), temperature source Oral, resp. rate 18, last menstrual period 10/30/2018, SpO2 100 %. General appearance: alert, cooperative and no distress Lungs: regular rate and  effort Heart: regular rate  Abdomen: soft, non-tender Extremities: Homans sign is negative, no sign of DVT Presentation: cephalic Fetal monitoringBaseline: 140 bpm, Variability: Good {> 6 bpm), Accelerations: Reactive and Decelerations: Absent Uterine activityFrequency: Every 6-8 minutes, Duration: 70-80 seconds and Intensity: mild Dilation: Fingertip Effacement (%): Thick Exam by:: Therisa Doyne, RN; Rush Farmer, RN   Prenatal labs: ABO, Rh: A/Positive/-- (07/02 1056) Antibody: Negative (07/02 1056) Rubella: 3.54 (07/02 1056) RPR: Non Reactive (10/01 0852)  HBsAg: Negative (07/02 1056)  HIV: Reactive (10/01 0852)  GBS: Negative/-- (12/11 0829)  2 hr GTT: 124/86/115  Prenatal Transfer Tool  Maternal Diabetes: No Genetic Screening: Normal Maternal Ultrasounds/Referrals: Other: Monthly ultrasounds per MFM, Bilateral CPC Fetal Ultrasounds or other Referrals:  None Maternal Substance Abuse:  No Significant Maternal Medications:  Meds include: Other: Triumeq Significant Maternal Lab Results: Group B Strep negative and HIV positive  Results for orders placed or performed during the hospital encounter of 07/09/19 (from the past 24 hour(s))  Wet prep, genital   Collection Time: 07/09/19  1:07 PM  Result Value Ref Range   Yeast Wet Prep HPF POC NONE SEEN NONE SEEN   Trich, Wet Prep NONE SEEN NONE SEEN   Clue Cells Wet Prep HPF POC NONE SEEN NONE SEEN   WBC, Wet Prep HPF POC MANY (A) NONE SEEN   Sperm NONE SEEN   Amnisure rupture of membrane (rom)not at [facility]   Collection Time: 07/09/19  1:07 PM  Result Value Ref Range   Amnisure ROM POSITIVE     Patient Active Problem List   Diagnosis Date Noted   Indication for care in labor and delivery, antepartum 07/09/2019   Trichomonal vaginitis during pregnancy 01/28/2019   HIV infection in mother during pregnancy, antepartum 01/22/2019   Language barrier 01/22/2019   History of preterm premature rupture of membranes 01/22/2019    Gestational thrombocytopenia ([place]) 01/22/2019   Low blood pressure, not hypotension 01/02/2016   Lead exposure 12/16/2015   Raised antibody titer 10/25/2015   HIV disease ([place]) 09/30/2015   Supervision of high risk pregnancy, antepartum 09/30/2015    Assessment/Plan: Khaleelah Overbaugh is a 27 y.o. G2P0101 at 28w5dhere for SROM  1. Labor: Admit to L&D - Plan for buccal Cytotec x1 now. Assess for foley bulb at next SVE. 2. FWB: Category I 3. Pain: Labor without pain medications - Considering epidural 4. GBS: GBS negative 5. HIV Infection: Undetectable virtual load, Infectious Disease notified, plan for AZT infusion during labor 6. Postpartum Contraception: Undecided 7. Anticipated MOD: SVD  Interpreter services used during this patient interaction.  Juanna Cao, Student-MidWife  07/09/2019, 2:37 PM  I confirm that I have verified the information documented in the nurse midwife student's note and that I have also personally reperformed the history, physical exam and all medical decision making activities of this service and have verified that all service and findings are accurately documented in this student's note.    Simona Huh 07/09/2019 7:43 PM

## 2019-07-09 NOTE — MAU Provider Note (Signed)
First Provider Initiated Contact with Patient 07/09/19 1214       S: Ms. Mary Zhang is a 28 y.o. G2P0101 at [redacted]w[redacted]d  who presents to MAU today complaining of leaking of fluid since at 5 am and 6 am. She had two gushes of fluid but has not been leaking fluid since then. She denies vaginal bleeding. She endorses contractions. She reports normal fetal movement.    O: BP 106/64 (BP Location: Right Arm)   Pulse (!) 108   Temp 97.6 F (36.4 C) (Oral)   Resp 17   LMP 10/30/2018 (Approximate)   SpO2 100%  GENERAL: Well-developed, well-nourished female in no acute distress.  HEAD: Normocephalic, atraumatic.  CHEST: Normal effort of breathing, regular heart rate ABDOMEN: Soft, nontender, gravid PELVIC: Normal external female genitalia. Vagina is pink and rugated. Cervix with normal contour, no lesions. Normal discharge.  Questionable pooling.   Cervical exam:    RN   Fetal Monitoring: Baseline: 135 Variability: mod Accelerations: present Decelerations: neg Contractions: contraction q1-3 that she reports as cramping, not contractions.   No results found for this or any previous visit (from the past 24 hour(s)). Sterile fern: Negative Amnisure is positive   A: SIUP at [redacted]w[redacted]d  SROM  P: Admit to Labor and Delivery  Starr [place], CNM 07/09/2019 12:26 PM

## 2019-07-09 NOTE — Telephone Encounter (Signed)
Patients husband called and states his wife is having leaking of water. Patient's husband given instructions to take her immediately to [place] tower at [facility]. Kathrene Alu RN

## 2019-07-09 NOTE — MAU Note (Signed)
..  Mary Zhang is a 28 y.o. at [redacted]w[redacted]d here in MAU reporting: leaking of fluids since 0500. Reports abdominal tightness. +FM. No vaginal bleeding.   Onset of complaint: 0500  Vitals:   07/09/19 1146  BP: 106/64  Pulse: (!) 108  Resp: 17  Temp: 97.6 F (36.4 C)  SpO2: 100%     FHT: 134 external Lab orders placed from triage: [name]

## 2019-07-09 NOTE — Discharge Summary (Addendum)
Postpartum Discharge Summary     Patient Name: Mary Zhang DOB: Feb 16, 1991 MRN: [id number]  Date of admission: 07/09/2019 Delivering Provider: Christin Fudge   Date of discharge: 07/11/2019  Admitting diagnosis: Indication for care in labor and delivery, antepartum [O75.9] Intrauterine pregnancy: [redacted]w[redacted]d    Secondary diagnosis:  Active Problems:   Indication for care in labor and delivery, antepartum  Additional problems: HIV + Gestational thrombocytopenia Hx PPH     Discharge diagnosis: Term Pregnancy Delivered                                                                                                Post partum procedures:none  Augmentation: Cytotec  Complications: None  Hospital course:  Onset of Labor With Vaginal Delivery     28y.o. yo G2P0101 at 34w5das admitted in Latent Labor on 07/09/2019. After SROM. Received 2 doses of cytotec.  One hour after the 2nd dose, pt progressed to C/C/+2.  15 minute 2nd stage  TCA started before pushing d/t hx PPH.  Patient had an uncomplicated labor course as follows:  Membrane Rupture Time/Date: 6:00 AM ,07/09/2019   Intrapartum Procedures: Episiotomy: None [1]                                         Lacerations:  None [1]  Patient had a delivery of a Viable infant. 07/09/2019  Information for the patient's newborn:  AsAela, Bohanibr [[id number]]Delivery Method: Vaginal, Spontaneous(Filed from Delivery Summary)     Pateint had an uncomplicated postpartum course.  She is ambulating, tolerating a regular diet, passing flatus, and urinating well. Patient is discharged home in stable condition on 07/11/19.  Delivery time: 9:41 PM    Magnesium Sulfate received: No BMZ received: No Rhophylac:N/A MMR:N/A Transfusion:No  Physical exam  Vitals:   07/10/19 1300 07/10/19 2138 07/11/19 0543 07/11/19 1414  BP: 107/68 (!) 98/59 (!) 82/47 (!) 80/41  Pulse: 89 69 94 90  Resp: '16 18 18 18  '$ Temp: 98.4 F (36.9 C) 98.1 F  (36.7 C) 97.9 F (36.6 C) 98 F (36.7 C)  TempSrc: Oral Oral Oral Oral  SpO2: 99%  100% 100%  Weight:      Height:       In consultation with Dr. PiIlda BassetPt OK to be discharged home with low BP if asymptomatic. Patient has denies symptoms of low BP to provider and RN.  General: alert, cooperative and no distress Lochia: appropriate Uterine Fundus: firm Incision: N/A DVT Evaluation: No evidence of DVT seen on physical exam. Negative Homan's sign. No cords or calf tenderness. No significant calf/ankle edema. Labs: Lab Results  Component Value Date   WBC 10.7 (H) 07/10/2019   HGB 12.4 07/10/2019   HCT 35.3 (L) 07/10/2019   MCV 91.5 07/10/2019   PLT 119 (L) 07/10/2019   CMP Latest Ref Rng & Units 01/22/2019  Glucose 65 - 99 mg/dL 84  BUN 6 - 20 mg/dL 4(L)  Creatinine 0.57 - 1.00 mg/dL  0.53(L)  Sodium 134 - 144 mmol/L 136  Potassium 3.5 - 5.2 mmol/L 3.5  Chloride 96 - 106 mmol/L 104  CO2 20 - 29 mmol/L 20  Calcium 8.7 - 10.2 mg/dL 8.9  Total Protein 6.0 - 8.5 g/dL 6.8  Total Bilirubin 0.0 - 1.2 mg/dL 0.4  Alkaline Phos 39 - 117 IU/L 50  AST 0 - 40 IU/L 13  ALT 0 - 32 IU/L 10    Discharge instruction: per After Visit Summary and "Baby and Me Booklet".  After visit meds:  Allergies as of 07/11/2019   No Known Allergies     Medication List    STOP taking these medications   CONCEPT DHA PO     TAKE these medications   acetaminophen 325 MG tablet Commonly known as: Tylenol Take 2 tablets (650 mg total) by mouth every 4 (four) hours as needed.   AMBULATORY NON FORMULARY MEDICATION 1 Device by Other route once a week. Blood Pressure Cuff Medium Monitored Regularly at home ICD 10:Z34.90   doxylamine (Sleep) 25 MG tablet Commonly known as: UNISOM Take 12.5 mg (0.5 tablets) every 4-6 hours as needed for nausea and vomiting.   folic acid 1 MG tablet Commonly known as: FOLVITE Take 1 tablet (1 mg total) by mouth daily.   One-A-Day Womens Prenatal 1 28-0.8-235  MG Caps Take 1 tablet by mouth daily.   Symtuza 800-150-200-10 MG Tabs Generic drug: Darunavir-Cobicisctat-Emtricitabine-Tenofovir Alafenamide Take 1 tablet by mouth daily with breakfast.   vitamin B-6 25 MG tablet Commonly known as: pyridOXINE Take 1 tablet by mouth every 4-6 hours as needed for nausea.      Called pharmacy to cancel ibuprofen d/t interaction with Symtuza. Pharmacy reports they never received prescription. RN instructed to discard paper RX.  Diet: routine diet  Activity: Advance as tolerated. Pelvic rest for 6 weeks.   Outpatient follow up:4 weeks Follow up Appt: Future Appointments  Date Time Provider [place]  08/11/2019 10:00 AM [facility] FINANCIAL COUNSELOR [facility]  08/12/2019  3:15 PM Lavonia Drafts, MD [facility] None  10/07/2019 11:00 AM Carlyle Basques, MD [facility]   Follow up Visit: [place]. Go in 4 week(s).   Specialty: Obstetrics and Gynecology Contact information: [place] [address] [phone]           Please schedule this patient for Postpartum visit in: 1 week with the following provider: Any provider For C/S patients schedule nurse incision check in weeks 2 weeks: no High risk pregnancy complicated by: HIV Delivery mode:  SVD Anticipated Birth Control:  Condoms PP Procedures needed: none  Schedule Integrated BH visit: no  Newborn Data: Live born female  Birth Weight:   APGAR: 43, 9  Newborn Delivery   Birth date/time: 07/09/2019 21:41:00 Delivery type: Vaginal, Spontaneous      Baby Feeding: Bottle Disposition:home with mother   I confirm that I have verified the information documented in the medical student's note and that I have also personally reperformed the history, physical exam and all medical decision making activities of this service and have verified that all service  and findings are accurately documented in this student's note.   07/11/2019 Clarisa Fling, NP

## 2019-07-09 NOTE — Progress Notes (Signed)
Discussed with Mary Zhang appearance of uterine contractions on monitor tracing. Contraction are short, barely palpable, and uterine irritability not true contractions. Patient comfortable and denies any pain (via interpreter) Cytotec dose given

## 2019-07-10 ENCOUNTER — Encounter: Payer: Medicaid Other | Admitting: Obstetrics & Gynecology

## 2019-07-10 ENCOUNTER — Telehealth: Payer: Self-pay

## 2019-07-10 DIAGNOSIS — B20 Human immunodeficiency virus [HIV] disease: Secondary | ICD-10-CM

## 2019-07-10 LAB — CBC
HCT: 35.3 % — ABNORMAL LOW (ref 36.0–46.0)
Hemoglobin: 12.4 g/dL (ref 12.0–15.0)
MCH: 32.1 pg (ref 26.0–34.0)
MCHC: 35.1 g/dL (ref 30.0–36.0)
MCV: 91.5 fL (ref 80.0–100.0)
Platelets: 119 10*3/uL — ABNORMAL LOW (ref 150–400)
RBC: 3.86 MIL/uL — ABNORMAL LOW (ref 3.87–5.11)
RDW: 12.6 % (ref 11.5–15.5)
WBC: 10.7 10*3/uL — ABNORMAL HIGH (ref 4.0–10.5)
nRBC: 0 % (ref 0.0–0.2)

## 2019-07-10 LAB — RPR: RPR Ser Ql: NONREACTIVE

## 2019-07-10 MED ORDER — ONDANSETRON HCL 4 MG/2ML IJ SOLN: 4.0000 mg | INTRAMUSCULAR | Status: DC | PRN

## 2019-07-10 MED ORDER — PRENATAL MULTIVITAMIN CH
1.0000 | ORAL_TABLET | Freq: Every day | ORAL | Status: DC
  Administered 2019-07-10 – 2019-07-11 (×2): 1 via ORAL
  Filled 2019-07-10 (×2): qty 1

## 2019-07-10 MED ORDER — BENZOCAINE-MENTHOL 20-0.5 % EX AERO
1.0000 "application " | INHALATION_SPRAY | CUTANEOUS | Status: DC | PRN
  Administered 2019-07-10: 1 via TOPICAL
  Filled 2019-07-10: qty 56

## 2019-07-10 MED ORDER — DIPHENHYDRAMINE HCL 25 MG PO CAPS: 25.0000 mg | ORAL_CAPSULE | Freq: Four times a day (QID) | ORAL | Status: DC | PRN

## 2019-07-10 MED ORDER — ZOLPIDEM TARTRATE 5 MG PO TABS: 5.0000 mg | ORAL_TABLET | Freq: Every evening | ORAL | Status: DC | PRN

## 2019-07-10 MED ORDER — SIMETHICONE 80 MG PO CHEW: 80.0000 mg | CHEWABLE_TABLET | ORAL | Status: DC | PRN

## 2019-07-10 MED ORDER — WITCH HAZEL-GLYCERIN EX PADS: 1.0000 "application " | MEDICATED_PAD | CUTANEOUS | Status: DC | PRN

## 2019-07-10 MED ORDER — ONDANSETRON HCL 4 MG PO TABS: 4.0000 mg | ORAL_TABLET | ORAL | Status: DC | PRN

## 2019-07-10 MED ORDER — DIBUCAINE (PERIANAL) 1 % EX OINT: 1.0000 "application " | TOPICAL_OINTMENT | CUTANEOUS | Status: DC | PRN

## 2019-07-10 MED ORDER — IBUPROFEN 600 MG PO TABS
600.0000 mg | ORAL_TABLET | Freq: Four times a day (QID) | ORAL | Status: DC
  Administered 2019-07-10 – 2019-07-11 (×6): 600 mg via ORAL
  Filled 2019-07-10 (×6): qty 1

## 2019-07-10 MED ORDER — SENNOSIDES-DOCUSATE SODIUM 8.6-50 MG PO TABS
2.0000 | ORAL_TABLET | ORAL | Status: DC
  Administered 2019-07-10 – 2019-07-11 (×2): 2 via ORAL
  Filled 2019-07-10 (×2): qty 2

## 2019-07-10 MED ORDER — TETANUS-DIPHTH-ACELL PERTUSSIS 5-2.5-18.5 LF-MCG/0.5 IM SUSP: 0.5000 mL | Freq: Once | INTRAMUSCULAR | Status: DC

## 2019-07-10 MED ORDER — SYMTUZA 800-150-200-10 MG PO TABS: 1.0000 | ORAL_TABLET | Freq: Every day | ORAL | 5 refills | Status: DC

## 2019-07-10 MED ORDER — COCONUT OIL OIL: 1.0000 "application " | TOPICAL_OIL | Status: DC | PRN

## 2019-07-10 MED ORDER — ACETAMINOPHEN 325 MG PO TABS
650.0000 mg | ORAL_TABLET | ORAL | Status: DC | PRN
  Administered 2019-07-11 (×2): 650 mg via ORAL
  Filled 2019-07-10 (×2): qty 2

## 2019-07-10 NOTE — Addendum Note (Signed)
Addended by: Darletta Moll on: 07/10/2019 10:57 AM   Modules accepted: Orders

## 2019-07-10 NOTE — Telephone Encounter (Signed)
Looks like she was previously on [employer]. Will send to [facility] in [place] (that is where her Triumeq was sent last). Advised to have him/her reach out to them for shipment. Thanks!

## 2019-07-10 NOTE — Telephone Encounter (Signed)
Received call today from patient's husband stating patient has delivered her baby on 12/17. Patient would like to restart previous regimen. Both patient and baby are doing good. Will forward call to [facility], Pharmacist and Dr, Baxter Flattery. [place]

## 2019-07-10 NOTE — Progress Notes (Addendum)
CSW received consult for HIV exposed newborn, specialty care to be arranged by CSW. CSW met with [name] to offer support and complete assessment with assistance from [employer] #[id number].    [name] resting in bed with [name] present at bedside and infant asleep in bassinet, when CSW entered the room. CSW introduced self and received verbal permission to complete assessment with [name] present and confirmed [name] was aware of all of [name]'s medical diagnoses before proceeding. CSW aware through notes in chart that [name] is involved in care but confirmed prior to starting assessment. CSW explained reason for consult to which [name] and [name] expressed understanding. [name] acknowledged being diagnosed with HIV in 2017 and is [facility]. [name] denied any concerns and confirmed she is compliant in taking her medications. CSW inquired about if [name]'s previous child was followed by a Pediatric Infect[facility] Disease Clinic to which [name] and [name] confirmed child was followed in [place]. CSW inquired about if [name] and [name] would like to have this infant follow up at the same clinic. [name] expressed interest in a clinic closer to [place]. CSW informed [name] and [name] of closest clinics to this area and [name] and [name] decided on [place] ([facility] with [facility]).  [name] confirmed having all essential items for infant once discharged and reported infant would be sleeping in a crib once home. [name] aware of Sudden Infant Death Syndrome (SIDS) precautions and safe sleeping habits. CSW provided education regarding the baby blues period vs. perinatal mood disorders.   CSW identifies no further need for intervention and no barriers to discharge at this time.  CSW spoke with Ida Norrell, with [facility], who was able to get infant an appointment on 07/31/2019 at 9:30am.  Tatsuya Okray, LCSW [facility] [phone]  

## 2019-07-10 NOTE — Progress Notes (Addendum)
Post Partum Day 1  Subjective:  Mary Zhang is a 28 y.o. K9T2671 [redacted]w[redacted]d s/p SVD.  No acute events overnight.  Pt denies problems with ambulating, voiding or po intake.  She denies nausea or vomiting.  Pain is well controlled.  She has had flatus. She has not had bowel movement.  Lochia Moderate.  Plan for birth control is no method.  Method of Feeding: Bottle due to HIV+ status  Objective: BP (!) 90/56 (BP Location: Left Arm)   Pulse 73   Temp 98.3 F (36.8 C) (Axillary)   Resp 18   Ht 5\' 2"  (1.575 m)   Wt 75 kg   LMP 10/30/2018 (Approximate)   SpO2 98%   Breastfeeding Unknown   BMI 30.24 kg/m   Physical Exam:  General: alert, cooperative and no distress Lochia:normal flow Chest: normal work of breathing Heart: regular rate Abdomen: soft, nontender Uterine Fundus: firm DVT Evaluation: No evidence of DVT seen on physical exam.   Recent Labs    07/09/19 1409  HGB 13.6  HCT 38.1    Assessment/Plan:  ASSESSMENT: Mary Zhang is a 28 y.o. G2P1102 [redacted]w[redacted]d ppd #1 s/p NSVD, doing well.   Plan for discharge tomorrow She does not want contraception.  Continue routine PP care  HIV+: restart home abacavir-dolutegravir-lamiVUDine   Gestational thrombocytopenia: PLT 142 prior to delivery, recheck 119.    LOS: 1 day   Demetrius Revel, DO 07/10/2019, 7:14 AM   GME ATTESTATION:  I saw and evaluated the patient. I agree with the findings and the plan of care as documented in the resident's note.  Merilyn Baba, DO OB Fellow, Faculty Practice 07/10/2019 9:29 AM

## 2019-07-11 MED ORDER — ACETAMINOPHEN 325 MG PO TABS: 650.0000 mg | ORAL_TABLET | ORAL | 0 refills | Status: DC | PRN

## 2019-07-11 MED ORDER — IBUPROFEN 600 MG PO TABS: 600.0000 mg | ORAL_TABLET | Freq: Four times a day (QID) | ORAL | 0 refills | Status: DC

## 2019-07-11 NOTE — Discharge Instructions (Signed)

## 2019-07-13 ENCOUNTER — Ambulatory Visit (HOSPITAL_COMMUNITY)
Admission: RE | Admit: 2019-07-13 | Payer: Medicaid Other | Source: Ambulatory Visit | Attending: Obstetrics and Gynecology | Admitting: Obstetrics and Gynecology

## 2019-07-13 ENCOUNTER — Ambulatory Visit (HOSPITAL_COMMUNITY): Payer: Medicaid Other

## 2019-07-13 ENCOUNTER — Encounter (HOSPITAL_COMMUNITY): Payer: Self-pay

## 2019-07-20 ENCOUNTER — Encounter: Payer: Medicaid Other | Admitting: Obstetrics & Gynecology

## 2019-07-27 ENCOUNTER — Encounter: Payer: Medicaid Other | Admitting: Obstetrics & Gynecology

## 2019-08-11 ENCOUNTER — Ambulatory Visit: Payer: Medicaid Other

## 2019-08-12 ENCOUNTER — Ambulatory Visit (INDEPENDENT_AMBULATORY_CARE_PROVIDER_SITE_OTHER): Payer: Medicaid Other | Admitting: Obstetrics & Gynecology

## 2019-08-12 ENCOUNTER — Encounter: Payer: Self-pay | Admitting: Obstetrics & Gynecology

## 2019-08-12 ENCOUNTER — Other Ambulatory Visit: Payer: Self-pay

## 2019-08-12 NOTE — Progress Notes (Signed)
Post Partum Exam  In person interpreter used for duration of visit  Mary Zhang is a 29 y.o. G36P1102 female who presents for a postpartum visit. She is 4 weeks postpartum following a spontaneous vaginal delivery. I have fully reviewed the prenatal and intrapartum course. The delivery was at 37.5 gestational weeks.  Anesthesia: none. 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 Baby's course has been uneventful . Baby is feeding by bottle. Bleeding no bleeding. Bowel function is normal. Bladder function is normal. Patient is sexually active. Contraception method is none. Postpartum depression screening:neg (score 6)   Last pap smear done 01/22/2019 and was Normal  Review of Systems Pertinent items are noted in HPI.    Objective:  Last menstrual period 10/30/2018, unknown if currently breastfeeding.   CONSTITUTIONAL: Well-developed, well-nourished female in no acute distress.  HENT:  Normocephalic, atraumatic EYES: Conjunctivae and EOM are normal. No scleral icterus.  NECK: Normal range of motion SKIN: Skin is warm and dry. No rash noted. Not diaphoretic.No pallor. NEUROLGIC: Alert and oriented to person, place, and time. Normal coordination.       Assessment:   4 weeks postpartum exam. Pap smear not done at today's visit.   Plan:   1. Contraception: none. Pt will use condoms and natural family planning.  2. Follow up in: 1 year or as needed.  3. Rec PNL daily  Mary Zhang, M.D., [place]

## 2019-09-17 ENCOUNTER — Encounter: Payer: Self-pay | Admitting: Internal Medicine

## 2019-10-07 ENCOUNTER — Ambulatory Visit (INDEPENDENT_AMBULATORY_CARE_PROVIDER_SITE_OTHER): Payer: Medicaid Other | Admitting: Internal Medicine

## 2019-10-07 ENCOUNTER — Encounter: Payer: Self-pay | Admitting: Internal Medicine

## 2019-10-07 ENCOUNTER — Other Ambulatory Visit: Payer: Self-pay

## 2019-10-07 VITALS — BP 108/72 | HR 65 | Wt 157.0 lb

## 2019-10-07 DIAGNOSIS — B20 Human immunodeficiency virus [HIV] disease: Secondary | ICD-10-CM | POA: Diagnosis not present

## 2019-10-07 DIAGNOSIS — Z79899 Other long term (current) drug therapy: Secondary | ICD-10-CM

## 2019-10-07 DIAGNOSIS — N926 Irregular menstruation, unspecified: Secondary | ICD-10-CM | POA: Diagnosis not present

## 2019-10-07 MED ORDER — BICTEGRAVIR-EMTRICITAB-TENOFOV 50-200-25 MG PO TABS: 1.0000 | ORAL_TABLET | Freq: Every day | ORAL | 11 refills | Status: DC

## 2019-10-07 NOTE — Patient Instructions (Signed)
   To get covid vaccine, call these numbers to get vaccine appt. Both you and your husband qualify. No need for identification card.  [employer], [place] = [phone]

## 2019-10-07 NOTE — Progress Notes (Signed)
Patient ID: Mary Zhang, female   DOB: 01-Nov-1990, 29 y.o.   MRN: [id number]  HPI 29yo F with hiv disease, CD 4 count 420/VL<20 in dec 2020, had healthy delivery of baby girl on dec 17th, 2020. Has been on symtuza. Previously was on biktarvy prior to pregnancy. She is doing well with the birth of her daughter. Fatigued since she wakes up 2-3hr for feeding in the night. She states her period is irregular now.   Outpatient Encounter Medications as of 10/07/2019  Medication Sig  . acetaminophen (TYLENOL) 325 MG tablet Take 2 tablets (650 mg total) by mouth every 4 (four) hours as needed.  . AMBULATORY NON FORMULARY MEDICATION 1 Device by Other route once a week. Blood Pressure Cuff Medium Monitored Regularly at home ICD 10:Z34.90  . Darunavir-Cobicisctat-Emtricitabine-Tenofovir Alafenamide (SYMTUZA) 800-150-200-10 MG TABS Take 1 tablet by mouth daily with breakfast.  . doxylamine, Sleep, (UNISOM) 25 MG tablet Take 12.5 mg (0.5 tablets) every 4-6 hours as needed for nausea and vomiting. (Patient not taking: Reported on 04/23/2019)  . folic acid (FOLVITE) 1 MG tablet Take 1 tablet (1 mg total) by mouth daily.  . Prenat-Fe Carbonyl-FA-Omega 3 (ONE-A-DAY WOMENS PRENATAL 1) 28-0.8-235 MG CAPS Take 1 tablet by mouth daily.  . vitamin B-6 (PYRIDOXINE) 25 MG tablet Take 1 tablet by mouth every 4-6 hours as needed for nausea. (Patient not taking: Reported on 03/10/2019)   No facility-administered encounter medications on file as of 10/07/2019.     Patient Active Problem List   Diagnosis Date Noted  . Indication for care in labor and delivery, antepartum 07/09/2019  . Trichomonal vaginitis during pregnancy 01/28/2019  . 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 Language barrier 01/22/2019  . History of preterm premature rupture of membranes 01/22/2019  . Gestational thrombocytopenia (HCC) 01/22/2019  . Low blood pressure, not hypotension 01/02/2016  . Lead exposure  12/16/2015  . Raised antibody titer 10/25/2015  . HIV disease (HCC) 09/30/2015  . Supervision of high risk pregnancy, antepartum 09/30/2015   Social History   Tobacco Use  . Smoking status: Never Smoker  . Smokeless tobacco: Never Used  Substance Use Topics  . Alcohol use: No  . Drug use: No    There are no preventive care reminders to display for this patient.   Review of Systems 12 point ros is negative except what is mentioned above Physical Exam  BP 108/72   Pulse 65   Wt 157 lb (71.2 kg)   BMI 28.72 kg/m  Physical Exam  Constitutional:  oriented to person, place, and time. appears well-developed and well-nourished. No distress.  HENT: [place]/AT, PERRLA, no scleral icterus Mouth/Throat: Oropharynx is clear and moist. No oropharyngeal exudate.  Cardiovascular: Normal rate, regular rhythm and normal heart sounds. Exam reveals no gallop and no friction rub.  No murmur heard.  Pulmonary/Chest: Effort normal and breath sounds normal. No respiratory distress.  has no wheezes.  Neck = supple, no nuchal rigidity Abdominal: Soft. Bowel sounds are normal.  exhibits no distension. There is no tenderness.  Lymphadenopathy: no cervical adenopathy. No axillary adenopathy Neurological: alert and oriented to person, place, and time.  Skin: Skin is warm and dry. No rash noted. No erythema.  Psychiatric: a normal mood and affect.  behavior is normal.    Lab Results  Component Value Date   CD4TCELL 35 12/04/2018   Lab Results  Component Value Date   CD4TABS 429 12/04/2018   CD4TABS 460 08/26/2018   CD4TABS  530 10/02/2017   Lab Results  Component Value Date   HIV1RNAQUANT <20 NOT DETECTED 06/29/2019   No results found for: HEPBSAB Lab Results  Component Value Date   LABRPR NON REACTIVE 07/09/2019    CBC Lab Results  Component Value Date   WBC 10.7 (H) 07/10/2019   RBC 3.86 (L) 07/10/2019   HGB 12.4 07/10/2019   HCT 35.3 (L) 07/10/2019   PLT 119 (L) 07/10/2019   MCV  91.5 07/10/2019   MCH 32.1 07/10/2019   MCHC 35.1 07/10/2019   RDW 12.6 07/10/2019   LYMPHSABS 1.5 04/13/2019   MONOABS 384 02/12/2017   EOSABS 0.1 04/13/2019    BMET Lab Results  Component Value Date   NA 136 01/22/2019   K 3.5 01/22/2019   CL 104 01/22/2019   CO2 20 01/22/2019   GLUCOSE 84 01/22/2019   BUN 4 (L) 01/22/2019   CREATININE 0.53 (L) 01/22/2019   CALCIUM 8.9 01/22/2019   GFRNONAA 131 01/22/2019   GFRAA 152 01/22/2019       Assessment and Plan  hiv disease = will switch back to biktarvy  Irregular period = will ask her to see ob/gyn in the next 2 weeks if still abnormal  Health maintenance = will call her for appt - better to do j and j due to one stop  Long term medication management= wil check cr to ensure still at baseline

## 2019-10-08 LAB — T-HELPER CELL (CD4) - (RCID CLINIC ONLY)
CD4 % Helper T Cell: 34 % (ref 33–65)
CD4 T Cell Abs: 469 /uL (ref 400–1790)

## 2019-10-09 LAB — COMPLETE METABOLIC PANEL WITH GFR
AG Ratio: 1.5 (calc) (ref 1.0–2.5)
ALT: 15 U/L (ref 6–29)
AST: 17 U/L (ref 10–30)
Albumin: 4.1 g/dL (ref 3.6–5.1)
Alkaline phosphatase (APISO): 57 U/L (ref 31–125)
BUN/Creatinine Ratio: 12 (calc) (ref 6–22)
BUN: 6 mg/dL — ABNORMAL LOW (ref 7–25)
CO2: 28 mmol/L (ref 20–32)
Calcium: 9.3 mg/dL (ref 8.6–10.2)
Chloride: 106 mmol/L (ref 98–110)
Creat: 0.52 mg/dL (ref 0.50–1.10)
GFR, Est African American: 152 mL/min/{1.73_m2} (ref >=60)
GFR, Est Non African American: 131 mL/min/{1.73_m2} (ref >=60)
Globulin: 2.8 g/dL (calc) (ref 1.9–3.7)
Glucose, Bld: 81 mg/dL (ref 65–99)
Potassium: 4 mmol/L (ref 3.5–5.3)
Sodium: 138 mmol/L (ref 135–146)
Total Bilirubin: 0.5 mg/dL (ref 0.2–1.2)
Total Protein: 6.9 g/dL (ref 6.1–8.1)

## 2019-10-09 LAB — RPR: RPR Ser Ql: NONREACTIVE

## 2019-10-09 LAB — CBC WITH DIFFERENTIAL/PLATELET
Absolute Monocytes: 252 cells/uL (ref 200–950)
Basophils Absolute: 9 cells/uL (ref 0–200)
Basophils Relative: 0.2 %
Eosinophils Absolute: 41 cells/uL (ref 15–500)
Eosinophils Relative: 0.9 %
HCT: 38.8 % (ref 35.0–45.0)
Hemoglobin: 13.3 g/dL (ref 11.7–15.5)
Lymphs Abs: 1346 cells/uL (ref 850–3900)
MCH: 31.1 pg (ref 27.0–33.0)
MCHC: 34.3 g/dL (ref 32.0–36.0)
MCV: 90.7 fL (ref 80.0–100.0)
MPV: 10.6 fL (ref 7.5–12.5)
Monocytes Relative: 5.6 %
Neutro Abs: 2853 cells/uL (ref 1500–7800)
Neutrophils Relative %: 63.4 %
Platelets: 198 10*3/uL (ref 140–400)
RBC: 4.28 10*6/uL (ref 3.80–5.10)
RDW: 13.1 % (ref 11.0–15.0)
Total Lymphocyte: 29.9 %
WBC: 4.5 10*3/uL (ref 3.8–10.8)

## 2019-10-09 LAB — HIV-1 RNA QUANT-NO REFLEX-BLD
HIV 1 RNA Quant: <20 copies/mL
HIV-1 RNA Quant, Log: <1.3 Log copies/mL

## 2019-11-25 ENCOUNTER — Telehealth: Payer: Self-pay

## 2019-11-25 NOTE — Telephone Encounter (Signed)
Patient's husband called office today stating patient just found out she Is pregnant. Had home test done this morning. Would like to schedule appt with office in regards to medication regimen best suited to pregnancy. Lorenso Courier, [place]

## 2019-11-27 NOTE — Telephone Encounter (Signed)
Can you double book her into clinic next week

## 2019-11-30 ENCOUNTER — Other Ambulatory Visit: Payer: Self-pay

## 2019-11-30 ENCOUNTER — Ambulatory Visit (INDEPENDENT_AMBULATORY_CARE_PROVIDER_SITE_OTHER): Payer: Medicaid Other | Admitting: Pharmacist

## 2019-11-30 ENCOUNTER — Other Ambulatory Visit (HOSPITAL_COMMUNITY)
Admission: RE | Admit: 2019-11-30 | Discharge: 2019-11-30 | Disposition: A | Discharge status: Home / Self Care | Payer: Medicaid Other | Source: Ambulatory Visit | Attending: Internal Medicine | Admitting: Internal Medicine

## 2019-11-30 DIAGNOSIS — Z113 Encounter for screening for infections with a predominantly sexual mode of transmission: Secondary | ICD-10-CM | POA: Insufficient documentation

## 2019-11-30 DIAGNOSIS — Z79899 Other long term (current) drug therapy: Secondary | ICD-10-CM | POA: Diagnosis not present

## 2019-11-30 DIAGNOSIS — B20 Human immunodeficiency virus [HIV] disease: Secondary | ICD-10-CM | POA: Diagnosis not present

## 2019-11-30 DIAGNOSIS — Z3201 Encounter for pregnancy test, result positive: Secondary | ICD-10-CM | POA: Diagnosis not present

## 2019-11-30 MED ORDER — TRIUMEQ 600-50-300 MG PO TABS: 1.0000 | ORAL_TABLET | Freq: Every day | ORAL | 11 refills | Status: DC

## 2019-11-30 NOTE — Patient Instructions (Signed)
  [phone] Call for an appointment

## 2019-11-30 NOTE — Progress Notes (Signed)
HPI: Mary Zhang is a 29 y.o. female who presents to the [facility] pharmacy clinic for HIV follow-up.  Patient Active Problem List   Diagnosis Date Noted  . Indication for care in labor and delivery, antepartum 07/09/2019  . Trichomonal vaginitis during pregnancy 01/28/2019  . 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 Language barrier 01/22/2019  . History of preterm premature rupture of membranes 01/22/2019  . Gestational thrombocytopenia (HCC) 01/22/2019  . Low blood pressure, not hypotension 01/02/2016  . Lead exposure 12/16/2015  . Raised antibody titer 10/25/2015  . HIV disease (HCC) 09/30/2015  . Supervision of high risk pregnancy, antepartum 09/30/2015    Patient's Medications  New Prescriptions   ABACAVIR-DOLUTEGRAVIR-LAMIVUDINE (TRIUMEQ) 600-50-300 MG TABLET    Take 1 tablet by mouth daily.  Previous Medications   ACETAMINOPHEN (TYLENOL) 325 MG TABLET    Take 2 tablets (650 mg total) by mouth every 4 (four) hours as needed.   AMBULATORY NON FORMULARY MEDICATION    1 Device by Other route once a week. Blood Pressure Cuff Medium Monitored Regularly at home ICD 10:Z34.90   DOXYLAMINE, SLEEP, (UNISOM) 25 MG TABLET    Take 12.5 mg (0.5 tablets) every 4-6 hours as needed for nausea and vomiting.   VITAMIN B-6 (PYRIDOXINE) 25 MG TABLET    Take 1 tablet by mouth every 4-6 hours as needed for nausea.  Modified Medications   No medications on file  Discontinued Medications   BICTEGRAVIR-EMTRICITABINE-TENOFOVIR AF (BIKTARVY) 50-200-25 MG TABS TABLET    Take 1 tablet by mouth daily.    Allergies: No Known Allergies  Past Medical History: Past Medical History:  Diagnosis Date  . HIV disease (HCC) 09/30/2015  . Low blood pressure, not hypotension 01/02/2016  . Menorrhagia   . Shingles     Social History: Social History   Socioeconomic History  . Marital status: Married    Spouse name: Not on file  . Number of children: Not on file  . Years of  education: Not on file  . Highest education level: Not on file  Occupational History  . Not on file  Tobacco Use  . Smoking status: Never Smoker  . Smokeless tobacco: Never Used  Substance and Sexual Activity  . Alcohol use: No  . Drug use: No  . Sexual activity: Yes    Partners: Male    Birth control/protection: None    Comment: declined condoms  Other Topics Concern  . Not on file  Social History Narrative  . Not on file   Social Determinants of Health   Financial Resource Strain:   . Difficulty of Paying Living Expenses:   Food Insecurity: No Food Insecurity  . Worried About Programme researcher, broadcasting/film/video in the Last Year: Never true  . Ran Out of Food in the Last Year: Never true  Transportation Needs: No Transportation Needs  . Lack of Transportation (Medical): No  . Lack of Transportation (Non-Medical): No  Physical Activity:   . Days of Exercise per Week:   . Minutes of Exercise per Session:   Stress:   . Feeling of Stress :   Social Connections:   . Frequency of Communication with Friends and Family:   . Frequency of Social Gatherings with Friends and Family:   . Attends Religious Services:   . Active Member of Clubs or Organizations:   . Attends Banker Meetings:   Marland Kitchen Marital Status:     Labs: Lab Results  Component Value Date  HIV1RNAQUANT <20 NOT DETECTED 10/07/2019   HIV1RNAQUANT <20 NOT DETECTED 06/29/2019   HIV1RNAQUANT <20 NOT DETECTED 04/13/2019   CD4TABS 469 10/07/2019   CD4TABS 429 12/04/2018   CD4TABS 460 08/26/2018    RPR and STI Lab Results  Component Value Date   LABRPR NON-REACTIVE 11/30/2019   LABRPR NON-REACTIVE 10/07/2019   LABRPR NON REACTIVE 07/09/2019   LABRPR Non Reactive 04/23/2019   LABRPR Non Reactive 01/22/2019    STI Results GC CT  07/03/2019 Negative Negative  06/26/2019 Negative Negative  04/23/2019 Negative Negative  01/22/2019 Negative Negative  08/26/2018 Negative Negative  12/14/2015 - Negative    Hepatitis  B Lab Results  Component Value Date   HEPBSAG Negative 01/22/2019   Hepatitis C Lab Results  Component Value Date   HEPCAB Negative 03/21/2016   Hepatitis A No results found for: HAV Lipids: Lab Results  Component Value Date   CHOL 122 08/26/2018   TRIG 89 08/26/2018   HDL 42 (L) 08/26/2018   CHOLHDL 2.9 08/26/2018   [place] 63 08/26/2018    Current HIV Regimen: Biktarvy  Assessment: Lashon comes in today to follow up for her HIV infection after recently finding out that she is pregnant. She just recently delvered a healthy baby in mid December 2020 and now states that she took a home pregnancy test and it was positive.  She comes in today to switch medications as there is currently no data on using Biktarvy in pregnancy.  Will switch her back to Triumeq as this is what Dr. Baxter Flattery had her on back in the fall when she was previously pregnant. Reminded her how to take it and she is familiar. She states that she took some cough medication last week and is worried it hurt the baby. She is asking for a "full checkup". I told her that I would check labs and a HCG level but that she needed to call her OB/GYN to make an appointment with them for a pelvic exam and to get back in their care.  She saw someone at the [facility] in [facility] back in January but when I asked her, she is completely unaware of this appointment. She honestly acts like she has never seen someone for OB? I will write the number on her AVS and asked that her husband help her reach out to them.  She is scheduled to see Dr. Baxter Flattery in 1 month. I will send the Triumeq to [facility] in [place] per her preference for mailing. Her last menstrual period was April 4th and she tested positive via home test on 5/5. She is feeling fine and states that she does not have any nausea or vomiting at this time.  Plan: - Stop Biktarvy - Start Triumeq PO once daily - HIV viral load, CD4, RPR, urine cytology, CMET, CBC, HCG level - F/u  with Dr. Baxter Flattery 6/16 at [facility]. Jermarion Poffenberger, PharmD, BCIDP, AAHIVP, CPP Clinical Pharmacist Practitioner [place] [facility] 12/01/2019, 11:01 AM

## 2019-12-01 ENCOUNTER — Telehealth: Payer: Self-pay

## 2019-12-01 LAB — URINE CYTOLOGY ANCILLARY ONLY
Chlamydia: NEGATIVE
Comment: NEGATIVE
Comment: NEGATIVE
Comment: NORMAL
Neisseria Gonorrhea: NEGATIVE
Trichomonas: NEGATIVE

## 2019-12-01 LAB — T-HELPER CELL (CD4) - (RCID CLINIC ONLY)
CD4 % Helper T Cell: 36 % (ref 33–65)
CD4 T Cell Abs: 370 /uL — ABNORMAL LOW (ref 400–1790)

## 2019-12-01 NOTE — Telephone Encounter (Signed)
Pharmacy called office today to confirm medication change. Would like to know if they should fill Triumeq or Biktarvy. Relayed message to stop Biktarvy and continue Triumeq due to pregnancy. Lorenso Courier, [place]

## 2019-12-02 LAB — COMPREHENSIVE METABOLIC PANEL
AG Ratio: 1.7 (calc) (ref 1.0–2.5)
ALT: 15 U/L (ref 6–29)
AST: 11 U/L (ref 10–30)
Albumin: 4.3 g/dL (ref 3.6–5.1)
Alkaline phosphatase (APISO): 50 U/L (ref 31–125)
BUN: 10 mg/dL (ref 7–25)
CO2: 26 mmol/L (ref 20–32)
Calcium: 9.2 mg/dL (ref 8.6–10.2)
Chloride: 106 mmol/L (ref 98–110)
Creat: 0.5 mg/dL (ref 0.50–1.10)
Globulin: 2.6 g/dL (calc) (ref 1.9–3.7)
Glucose, Bld: 98 mg/dL (ref 65–99)
Potassium: 3.8 mmol/L (ref 3.5–5.3)
Sodium: 137 mmol/L (ref 135–146)
Total Bilirubin: 0.6 mg/dL (ref 0.2–1.2)
Total Protein: 6.9 g/dL (ref 6.1–8.1)

## 2019-12-02 LAB — HCG, QUANTITATIVE, PREGNANCY: HCG, Total, QN: 14282 m[IU]/mL

## 2019-12-02 LAB — HIV-1 RNA QUANT-NO REFLEX-BLD
HIV 1 RNA Quant: <20 copies/mL
HIV-1 RNA Quant, Log: <1.3 Log copies/mL

## 2019-12-02 LAB — CBC
HCT: 39 % (ref 35.0–45.0)
Hemoglobin: 13.6 g/dL (ref 11.7–15.5)
MCH: 31.4 pg (ref 27.0–33.0)
MCHC: 34.9 g/dL (ref 32.0–36.0)
MCV: 90.1 fL (ref 80.0–100.0)
MPV: 11 fL (ref 7.5–12.5)
Platelets: 192 10*3/uL (ref 140–400)
RBC: 4.33 10*6/uL (ref 3.80–5.10)
RDW: 12.5 % (ref 11.0–15.0)
WBC: 7.3 10*3/uL (ref 3.8–10.8)

## 2019-12-02 LAB — RPR: RPR Ser Ql: NONREACTIVE

## 2020-01-05 ENCOUNTER — Other Ambulatory Visit (HOSPITAL_COMMUNITY)
Admission: RE | Admit: 2020-01-05 | Discharge: 2020-01-05 | Disposition: A | Discharge status: Home / Self Care | Payer: Medicaid Other | Source: Ambulatory Visit | Attending: Advanced Practice Midwife | Admitting: Advanced Practice Midwife

## 2020-01-05 ENCOUNTER — Encounter: Payer: Self-pay | Admitting: Advanced Practice Midwife

## 2020-01-05 ENCOUNTER — Ambulatory Visit (INDEPENDENT_AMBULATORY_CARE_PROVIDER_SITE_OTHER): Payer: Medicaid Other | Admitting: Advanced Practice Midwife

## 2020-01-05 ENCOUNTER — Other Ambulatory Visit: Payer: Self-pay

## 2020-01-05 VITALS — BP 110/66 | HR 92 | Wt 159.0 lb

## 2020-01-05 DIAGNOSIS — Z603 Acculturation difficulty: Secondary | ICD-10-CM

## 2020-01-05 DIAGNOSIS — O98711 Human immunodeficiency virus [HIV] disease complicating pregnancy, first trimester: Secondary | ICD-10-CM | POA: Diagnosis not present

## 2020-01-05 DIAGNOSIS — O0990 Supervision of high risk pregnancy, unspecified, unspecified trimester: Secondary | ICD-10-CM | POA: Insufficient documentation

## 2020-01-05 DIAGNOSIS — O09891 Supervision of other high risk pregnancies, first trimester: Secondary | ICD-10-CM | POA: Diagnosis not present

## 2020-01-05 DIAGNOSIS — B20 Human immunodeficiency virus [HIV] disease: Secondary | ICD-10-CM

## 2020-01-05 DIAGNOSIS — Z8751 Personal history of pre-term labor: Secondary | ICD-10-CM | POA: Insufficient documentation

## 2020-01-05 DIAGNOSIS — O0991 Supervision of high risk pregnancy, unspecified, first trimester: Secondary | ICD-10-CM

## 2020-01-05 DIAGNOSIS — O3680X0 Pregnancy with inconclusive fetal viability, not applicable or unspecified: Secondary | ICD-10-CM | POA: Diagnosis not present

## 2020-01-05 DIAGNOSIS — Z3A10 10 weeks gestation of pregnancy: Secondary | ICD-10-CM

## 2020-01-05 DIAGNOSIS — Z789 Other specified health status: Secondary | ICD-10-CM

## 2020-01-05 DIAGNOSIS — O09899 Supervision of other high risk pregnancies, unspecified trimester: Secondary | ICD-10-CM | POA: Insufficient documentation

## 2020-01-05 MED ORDER — FOLIC ACID 1 MG PO TABS: 1.0000 mg | ORAL_TABLET | Freq: Every day | ORAL | 10 refills | Status: DC

## 2020-01-05 MED ORDER — CONCEPT OB 130-92.4-1 MG PO CAPS: 1.0000 | ORAL_CAPSULE | Freq: Every day | ORAL | 11 refills | Status: DC

## 2020-01-05 NOTE — Patient Instructions (Signed)
First Trimester of Pregnancy  The first trimester of pregnancy is from week 1 until the end of week 13 (months 1 through 3). During this time, your baby will begin to develop inside you. At 6-8 weeks, the eyes and face are formed, and the heartbeat can be seen on ultrasound. At the end of 12 weeks, all the baby's organs are formed. Prenatal care is all the medical care you receive before the birth of your baby. Make sure you get good prenatal care and follow all of your doctor's instructions. Follow these instructions at home: Medicines  Take over-the-counter and prescription medicines only as told by your doctor. Some medicines are safe and some medicines are not safe during pregnancy.  Take a prenatal vitamin that contains at least 600 micrograms (mcg) of folic acid.  If you have trouble pooping (constipation), take medicine that will make your stool soft (stool softener) if your doctor approves. Eating and drinking   Eat regular, healthy meals.  Your doctor will tell you the amount of weight gain that is right for you.  Avoid raw meat and uncooked cheese.  If you feel sick to your stomach (nauseous) or throw up (vomit): ? Eat 4 or 5 small meals a day instead of 3 large meals. ? Try eating a few soda crackers. ? Drink liquids between meals instead of during meals.  To prevent constipation: ? Eat foods that are high in fiber, like fresh fruits and vegetables, whole grains, and beans. ? Drink enough fluids to keep your pee (urine) clear or pale yellow. Activity  Exercise only as told by your doctor. Stop exercising if you have cramps or pain in your lower belly (abdomen) or low back.  Do not exercise if it is too hot, too humid, or if you are in a place of great height (high altitude).  Try to avoid standing for long periods of time. Move your legs often if you must stand in one place for a long time.  Avoid heavy lifting.  Wear low-heeled shoes. Sit and stand up  straight.  You can have sex unless your doctor tells you not to. Relieving pain and discomfort  Wear a good support bra if your breasts are sore.  Take warm water baths (sitz baths) to soothe pain or discomfort caused by hemorrhoids. Use hemorrhoid cream if your doctor says it is okay.  Rest with your legs raised if you have leg cramps or low back pain.  If you have puffy, bulging veins (varicose veins) in your legs: ? Wear support hose or compression stockings as told by your doctor. ? Raise (elevate) your feet for 15 minutes, 3-4 times a day. ? Limit salt in your food. Prenatal care  Schedule your prenatal visits by the twelfth week of pregnancy.  Write down your questions. Take them to your prenatal visits.  Keep all your prenatal visits as told by your doctor. This is important. Safety  Wear your seat belt at all times when driving.  Make a list of emergency phone numbers. The list should include numbers for family, friends, the hospital, and police and fire departments. General instructions  Ask your doctor for a referral to a local prenatal class. Begin classes no later than at the start of month 6 of your pregnancy.  Ask for help if you need counseling or if you need help with nutrition. Your doctor can give you advice or tell you where to go for help.  Do not use hot tubs, steam   rooms, or saunas.  Do not douche or use tampons or scented sanitary pads.  Do not cross your legs for long periods of time.  Avoid all herbs and alcohol. Avoid drugs that are not approved by your doctor.  Do not use any tobacco products, including cigarettes, chewing tobacco, and electronic cigarettes. If you need help quitting, ask your doctor. You may get counseling or other support to help you quit.  Avoid cat litter boxes and soil used by cats. These carry germs that can cause birth defects in the baby and can cause a loss of your baby (miscarriage) or stillbirth.  Visit your dentist.  At home, brush your teeth with a soft toothbrush. Be gentle when you floss. Contact a doctor if:  You are dizzy.  You have mild cramps or pressure in your lower belly.  You have a nagging pain in your belly area.  You continue to feel sick to your stomach, you throw up, or you have watery poop (diarrhea).  You have a bad smelling fluid coming from your vagina.  You have pain when you pee (urinate).  You have increased puffiness (swelling) in your face, hands, legs, or ankles. Get help right away if:  You have a fever.  You are leaking fluid from your vagina.  You have spotting or bleeding from your vagina.  You have very bad belly cramping or pain.  You gain or lose weight rapidly.  You throw up blood. It may look like coffee grounds.  You are around people who have German measles, fifth disease, or chickenpox.  You have a very bad headache.  You have shortness of breath.  You have any kind of trauma, such as from a fall or a car accident. Summary  The first trimester of pregnancy is from week 1 until the end of week 13 (months 1 through 3).  To take care of yourself and your unborn baby, you will need to eat healthy meals, take medicines only if your doctor tells you to do so, and do activities that are safe for you and your baby.  Keep all follow-up visits as told by your doctor. This is important as your doctor will have to ensure that your baby is healthy and growing well. This information is not intended to replace advice given to you by your health care provider. Make sure you discuss any questions you have with your health care provider. Document Revised: 10/30/2018 Document Reviewed: 07/17/2016 Elsevier Patient Education  2020 Elsevier Inc.  

## 2020-01-05 NOTE — Progress Notes (Signed)
DATING AND VIABILITY SONOGRAM   Mary Zhang is a 29 y.o. year old G46P1102 with LMP Patient's last menstrual period was 10/25/2019 (exact date). which would correlate to  [redacted]w[redacted]d weeks gestation.  She has regular menstrual cycles.   She is here today for a confirmatory initial sonogram. Short intervals between pregnancies.    GESTATION: SINGLETON     FETAL ACTIVITY:          Heart rate        174 bpm          The fetus is active.    ADNEXA: The ovaries are normal.   GESTATIONAL AGE AND  BIOMETRICS:  Gestational criteria: Estimated Date of Delivery: 07/31/20 by LMP now at [redacted]w[redacted]d  Previous Scans:0      CROWN RUMP LENGTH           3.63 cm         10-4 weeks                                                                               AVERAGE EGA(BY THIS SCAN):  10-4 weeks  WORKING EDD( LMP ):  10-2 weeks      TECHNICIAN COMMENTS:  Patient informed that the ultrasound is considered a limited obstetric ultrasound and is not intended to be a complete ultrasound exam. Patient also informed that the ultrasound is not being completed with the intent of assessing for fetal or placental anomalies or any pelvic abnormalities. Explained that the purpose of today's ultrasound is to assess for fetal heart rate. Patient acknowledges the purpose of the exam and the limitations of the study.    Armandina Stammer 01/05/2020 11:03 AM   . . . . . . . . . . . . . . . . . . . . . . . . . . . . . . . . . . . . . . . . . . . . . . . . . . . . . . . . . . . . . . . . . . . . . . . . . . . . . Marland Kitchen

## 2020-01-05 NOTE — Progress Notes (Signed)
Subjective:    Eltha Oshea is a [id number] [redacted]w[redacted]d being seen today for her first obstetrical visit.  Her obstetrical history is significant for HIV, previous preterm delivery, short interval between pregnancies., language barrier (interpretor present today) Patient does not intend to breast feed. Pregnancy history fully reviewed.  Patient reports no complaints.  Vitals:   01/05/20 1020  BP: 110/66  Pulse: 92  Weight: 159 lb (72.1 kg)    HISTORY: OB History  Gravida Para Term Preterm AB Living  3 2 1 1   2   SAB TAB Ectopic Multiple Live Births        0 2    # Outcome Date GA Lbr Len/2nd Weight Sex Delivery Anes PTL Lv  3 Current           2 Term 07/09/19 [redacted]w[redacted]d / 00:06 7 lb 11.6 oz (3.505 kg) M Vag-Spont None  LIV     Birth Comments: wnl  1 Preterm 06/09/16 [redacted]w[redacted]d  5 lb 12.4 oz (2.62 kg)  Vag-Spont EPI Y LIV     Complications: Preterm premature rupture of membranes   Past Medical History:  Diagnosis Date  . HIV disease ([place]) 09/30/2015  . Low blood pressure, not hypotension 01/02/2016  . Menorrhagia   . Shingles    Past Surgical History:  Procedure Laterality Date  . NO PAST SURGERIES     Family History  Problem Relation Age of Onset  . Hypertension Neg Hx   . Diabetes Neg Hx      Exam    Uterus:     Pelvic Exam:    Perineum: No Hemorrhoids, Normal Perineum   Vulva: Bartholin's, Urethra, Skene's normal   Vagina:  normal mucosa, normal discharge   pH:    Cervix: no cervical motion tenderness   Adnexa: normal adnexa and no mass, fullness, tenderness   Bony Pelvis: gynecoid  System: Breast:  normal appearance, no masses or tenderness   Skin: normal coloration and turgor, no rashes    Neurologic: oriented, grossly non-focal   Extremities: normal strength, tone, and muscle mass   HEENT neck supple with midline trachea   Mouth/Teeth mucous membranes moist, pharynx normal without lesions   Neck supple and no masses   Cardiovascular: regular rate and rhythm    Respiratory:  appears well, vitals normal, no respiratory distress, acyanotic, normal RR, ear and throat exam is normal   Abdomen: soft, non-tender; bowel sounds normal; no masses,  no organomegaly   Urinary: urethral meatus normal      Assessment:    Pregnancy: [id number] Patient Active Problem List   Diagnosis Date Noted  . Short interval between pregnancies affecting pregnancy, antepartum 01/05/2020  . Supervision of high risk pregnancy, antepartum 01/05/2020  . History of preterm delivery 01/05/2020  . Indication for care in labor and delivery, antepartum 07/09/2019  . Trichomonal vaginitis during pregnancy 01/28/2019  . 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 Language barrier 01/22/2019  . History of preterm premature rupture of membranes 01/22/2019  . Gestational thrombocytopenia ([place]) 01/22/2019  . Low blood pressure, not hypotension 01/02/2016  . Lead exposure 12/16/2015  . Raised antibody titer 10/25/2015  . HIV disease ([place]) 09/30/2015        Plan:  1.  Supervision high risk pregnancy 2.   HIV      Followed by ID, Dr Baxter Flattery 3.   Previous preterm delivery      Followed by term delivery, declines 17P 4.   Language Barrier  Speaks some English, interpretor present 5.   Hx Gest thrombocytopenia      Watch platelets   Initial labs drawn. Prenatal vitamins, requests previous Rx for [facility] and folate. Problem list reviewed and updated. Genetic Screening discussed First Screen: declined.  Ultrasound discussed; fetal survey: requested.  Follow up in 4 weeks. 50% of 30 min visit spent on counseling and coordination of care.   Discussed and offered genetic screening options, including Quad screen/AFP, NIPS testing, and option to decline testing. Benefits/risks/alternatives reviewed. Pt aware that anatomy US is form of genetic screening with lower accuracy in detecting trisomies than blood work.  Pt chooses genetic screening today. NIPS:  ordered. Ultrasound discussed; fetal anatomic survey: ordered. Problem list reviewed and updated. The nature of [facility] with multiple MDs and other Advanced Practice Providers was explained to patient; also emphasized that residents, students are part of our team. Routine obstetric precautions reviewed.    Wynelle Bourgeois 01/05/2020

## 2020-01-06 ENCOUNTER — Ambulatory Visit: Payer: Medicaid Other | Admitting: Internal Medicine

## 2020-01-06 LAB — CBC/D/PLT+RPR+RH+ABO+RUB AB...
Antibody Screen: NEGATIVE
Basophils Absolute: 0 10*3/uL (ref 0.0–0.2)
Basos: 0 %
EOS (ABSOLUTE): 0 10*3/uL (ref 0.0–0.4)
Eos: 1 %
HCV Ab: <0.1 s/co ratio (ref 0.0–0.9)
HIV Screen 4th Generation wRfx: REACTIVE — AB
Hematocrit: 37.9 % (ref 34.0–46.6)
Hemoglobin: 13.3 g/dL (ref 11.1–15.9)
Hepatitis B Surface Ag: NEGATIVE
Immature Grans (Abs): 0 10*3/uL (ref 0.0–0.1)
Immature Granulocytes: 0 %
Lymphocytes Absolute: 1.2 10*3/uL (ref 0.7–3.1)
Lymphs: 24 %
MCH: 31.5 pg (ref 26.6–33.0)
MCHC: 35.1 g/dL (ref 31.5–35.7)
MCV: 90 fL (ref 79–97)
Monocytes Absolute: 0.4 10*3/uL (ref 0.1–0.9)
Monocytes: 7 %
Neutrophils Absolute: 3.3 10*3/uL (ref 1.4–7.0)
Neutrophils: 68 %
Platelets: 155 10*3/uL (ref 150–450)
RBC: 4.22 x10E6/uL (ref 3.77–5.28)
RDW: 12.9 % (ref 11.7–15.4)
RPR Ser Ql: NONREACTIVE
Rh Factor: POSITIVE
Rubella Antibodies, IGG: 3.02 index (ref >0.99)
WBC: 4.9 10*3/uL (ref 3.4–10.8)

## 2020-01-06 LAB — HIV 1/2 AB DIFFERENTIATION
HIV 1 Ab: POSITIVE — AB
HIV 2 Ab: NEGATIVE
NOTE (HIV CONF MULTIP: POSITIVE — AB

## 2020-01-06 LAB — CERVICOVAGINAL ANCILLARY ONLY
Chlamydia: NEGATIVE
Comment: NEGATIVE
Comment: NEGATIVE
Comment: NORMAL
Neisseria Gonorrhea: NEGATIVE
Trichomonas: NEGATIVE

## 2020-01-06 LAB — HCV INTERPRETATION

## 2020-01-07 LAB — URINE CULTURE

## 2020-01-14 ENCOUNTER — Encounter: Payer: Self-pay | Admitting: Internal Medicine

## 2020-01-14 ENCOUNTER — Other Ambulatory Visit: Payer: Self-pay

## 2020-01-14 ENCOUNTER — Ambulatory Visit (INDEPENDENT_AMBULATORY_CARE_PROVIDER_SITE_OTHER): Payer: Medicaid Other | Admitting: Internal Medicine

## 2020-01-14 VITALS — BP 102/68 | HR 92 | Wt 144.8 lb

## 2020-01-14 DIAGNOSIS — B20 Human immunodeficiency virus [HIV] disease: Secondary | ICD-10-CM | POA: Diagnosis not present

## 2020-01-14 DIAGNOSIS — Z79899 Other long term (current) drug therapy: Secondary | ICD-10-CM

## 2020-01-14 DIAGNOSIS — Z3490 Encounter for supervision of normal pregnancy, unspecified, unspecified trimester: Secondary | ICD-10-CM | POA: Diagnosis not present

## 2020-01-14 MED ORDER — FOLIC ACID 1 MG PO TABS: 1.0000 mg | ORAL_TABLET | Freq: Every day | ORAL | 1 refills | Status: DC

## 2020-01-14 MED ORDER — CONCEPT OB 130-92.4-1 MG PO CAPS: 1.0000 | ORAL_CAPSULE | Freq: Every day | ORAL | 2 refills | Status: DC

## 2020-01-14 NOTE — Progress Notes (Signed)
Patient ID: Mary Zhang, female   DOB: 07-16-1991, 29 y.o.   MRN: [id number]  HPI 29yo F with well controlled hiv disease, on triumeq but also in 2nd trimester of pregnancy, she is  Sometimes having nausea/vomiting. Otherwise pregnancy going well.   She sees ob/gyn: in 2 months.    Outpatient Encounter Medications as of 01/14/2020  Medication Sig  . abacavir-dolutegravir-lamiVUDine (TRIUMEQ) 600-50-300 MG tablet Take 1 tablet by mouth daily.  Marland Kitchen acetaminophen (TYLENOL) 325 MG tablet Take 2 tablets (650 mg total) by mouth every 4 (four) hours as needed. (Patient not taking: Reported on 01/14/2020)  . AMBULATORY NON FORMULARY MEDICATION 1 Device by Other route once a week. Blood Pressure Cuff Medium Monitored Regularly at home ICD 10:Z34.90 (Patient not taking: Reported on 01/14/2020)  . doxylamine, Sleep, (UNISOM) 25 MG tablet Take 12.5 mg (0.5 tablets) every 4-6 hours as needed for nausea and vomiting. (Patient not taking: Reported on 04/23/2019)  . folic acid (FOLVITE) 1 MG tablet Take 1 tablet (1 mg total) by mouth daily. (Patient not taking: Reported on 01/14/2020)  . Prenat w/o A Vit-FeFum-FePo-FA (CONCEPT OB) 130-92.4-1 MG CAPS Take 1 capsule by mouth daily. (Patient not taking: Reported on 01/14/2020)  . vitamin B-6 (PYRIDOXINE) 25 MG tablet Take 1 tablet by mouth every 4-6 hours as needed for nausea. (Patient not taking: Reported on 03/10/2019)   No facility-administered encounter medications on file as of 01/14/2020.     Patient Active Problem List   Diagnosis Date Noted  . Short interval between pregnancies affecting pregnancy, antepartum 01/05/2020  . Supervision of high risk pregnancy, antepartum 01/05/2020  . History of preterm delivery 01/05/2020  . Indication for care in labor and delivery, antepartum 07/09/2019  . Trichomonal vaginitis during pregnancy 01/28/2019  . 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 Language barrier 01/22/2019  . History of  preterm premature rupture of membranes 01/22/2019  . Gestational thrombocytopenia ([place]) 01/22/2019  . Low blood pressure, not hypotension 01/02/2016  . Lead exposure 12/16/2015  . Raised antibody titer 10/25/2015  . HIV disease ([place]) 09/30/2015     Health Maintenance Due  Topic Date Due  . COVID-19 Vaccine (1) Never done    - no smoking or drinking  Review of Systems 12 point ros is negative except what is mentioned in hpi Physical Exam   BP 102/68   Pulse 92   Wt 144 lb 12.8 oz (65.7 kg)   LMP 10/25/2019 (Exact Date)   SpO2 100%   BMI 26.48 kg/m   Physical Exam  Constitutional:  oriented to person, place, and time. appears well-developed and well-nourished. No distress.  HENT: [place]/AT, PERRLA, no scleral icterus Mouth/Throat: Oropharynx is clear and moist. No oropharyngeal exudate.  Cardiovascular: Normal rate, regular rhythm and normal heart sounds. Exam reveals no gallop and no friction rub.  No murmur heard.  Pulmonary/Chest: Effort normal and breath sounds normal. No respiratory distress.  has no wheezes.  Neck = supple, no nuchal rigidity Abdominal: Soft. Bowel sounds are normal.  exhibits no distension. There is no tenderness. Gravid abdomen Lymphadenopathy: no cervical adenopathy. No axillary adenopathy Neurological: alert and oriented to person, place, and time.  Skin: Skin is warm and dry. No rash noted. No erythema.  Psychiatric: a normal mood and affect.  behavior is normal.   Lab Results  Component Value Date   CD4TCELL 36 11/30/2019   Lab Results  Component Value Date   CD4TABS 370 (L) 11/30/2019   CD4TABS 469  10/07/2019   CD4TABS 429 12/04/2018   Lab Results  Component Value Date   HIV1RNAQUANT <20 NOT DETECTED 11/30/2019   No results found for: HEPBSAB Lab Results  Component Value Date   LABRPR Non Reactive 01/05/2020    CBC Lab Results  Component Value Date   WBC 4.9 01/05/2020   RBC 4.22 01/05/2020   HGB 13.3 01/05/2020   HCT 37.9  01/05/2020   PLT 155 01/05/2020   MCV 90 01/05/2020   MCH 31.5 01/05/2020   MCHC 35.1 01/05/2020   RDW 12.9 01/05/2020   LYMPHSABS 1.2 01/05/2020   MONOABS 384 02/12/2017   EOSABS 0.0 01/05/2020    BMET Lab Results  Component Value Date   NA 137 11/30/2019   K 3.8 11/30/2019   CL 106 11/30/2019   CO2 26 11/30/2019   GLUCOSE 98 11/30/2019   BUN 10 11/30/2019   CREATININE 0.50 11/30/2019   CALCIUM 9.2 11/30/2019   GFRNONAA 131 10/07/2019   GFRAA 152 10/07/2019      Assessment and Plan  hiv disease = well controlled. Continue on current regimen  Health maintenance = plan to get covid vaccine here. And plan to get it when available clinic  Pregnancy = will do prenatal vitamins. interestd in getting  Vaccine. She would like to come back in 4-6 wk to get vaccine in clinic

## 2020-02-03 ENCOUNTER — Other Ambulatory Visit: Payer: Self-pay

## 2020-02-03 ENCOUNTER — Ambulatory Visit (INDEPENDENT_AMBULATORY_CARE_PROVIDER_SITE_OTHER): Payer: Medicaid Other | Admitting: Obstetrics & Gynecology

## 2020-02-03 ENCOUNTER — Ambulatory Visit: Payer: Medicaid Other

## 2020-02-03 VITALS — BP 99/64 | HR 108 | Wt 158.0 lb

## 2020-02-03 DIAGNOSIS — O98719 Human immunodeficiency virus [HIV] disease complicating pregnancy, unspecified trimester: Secondary | ICD-10-CM

## 2020-02-03 DIAGNOSIS — Z8751 Personal history of pre-term labor: Secondary | ICD-10-CM

## 2020-02-03 DIAGNOSIS — O0990 Supervision of high risk pregnancy, unspecified, unspecified trimester: Secondary | ICD-10-CM

## 2020-02-03 DIAGNOSIS — O09212 Supervision of pregnancy with history of pre-term labor, second trimester: Secondary | ICD-10-CM

## 2020-02-03 DIAGNOSIS — Z3A14 14 weeks gestation of pregnancy: Secondary | ICD-10-CM

## 2020-02-03 DIAGNOSIS — O09899 Supervision of other high risk pregnancies, unspecified trimester: Secondary | ICD-10-CM

## 2020-02-03 NOTE — Progress Notes (Signed)
   PRENATAL VISIT NOTE  Subjective:  Mary Zhang is a 29 y.o. G3P1102 at [redacted]w[redacted]d being seen today for ongoing prenatal care.  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  Patient reports no complaints.  Contractions: Not present. Vag. Bleeding: None.  Movement: Absent. Denies leaking of fluid.   The following portions of the patient's history were reviewed and updated as appropriate: allergies, current medications, past family history, past medical history, past social history, past surgical history and problem list.   Objective:   Vitals:   02/03/20 1415  BP: 99/64  Pulse: (!) 108  Weight: 158 lb 0.6 oz (71.7 kg)    Fetal Status: Fetal Heart Rate (bpm): 156   Movement: Absent     General:  Alert, oriented and cooperative. Patient is in no acute distress.  Skin: Skin is warm and dry. No rash noted.   Cardiovascular: Normal heart rate noted  Respiratory: Normal respiratory effort, no problems with respiration noted  Abdomen: Soft, gravid, appropriate for gestational age.  Pain/Pressure: Absent     Pelvic: Cervical exam deferred        Extremities: Normal range of motion.  Edema: None  Mental Status: Normal mood and affect. Normal behavior. Normal judgment and thought content.   Assessment and Plan:  Pregnancy: G3P1102 at [redacted]w[redacted]d 1. Supervision of high risk pregnancy, antepartum Needs cervical length Genetic testing ordered tdoay    2. Short interval between pregnancies affecting pregnancy, antepartum Needs cervical length  3. History of preterm delivery PTL followed by term  pregnancy Prev PPROM pt declined 56 OH P this pregnancy  Needs [place] for cervical length this pregnancy   4. HIV infection in mother during pregnancy, antepartum On Triumeq. Followed by Dr. Drue Second.   Preterm labor symptoms and general obstetric precautions including but not limited to vaginal bleeding, contractions, leaking of fluid and fetal movement were reviewed in detail with the patient. Please refer to After Visit Summary for other counseling recommendations.   Return in about 4 weeks (around 03/02/2020) for in person.  Future Appointments  Date Time Provider Department Center  02/22/2020 11:00 AM Judyann Munson, MD [facility]    Willodean Rosenthal, MD

## 2020-02-11 DIAGNOSIS — O0990 Supervision of high risk pregnancy, unspecified, unspecified trimester: Secondary | ICD-10-CM

## 2020-02-15 DIAGNOSIS — O0990 Supervision of high risk pregnancy, unspecified, unspecified trimester: Secondary | ICD-10-CM

## 2020-02-22 ENCOUNTER — Encounter: Payer: Self-pay | Admitting: Internal Medicine

## 2020-02-22 ENCOUNTER — Other Ambulatory Visit: Payer: Self-pay

## 2020-02-22 ENCOUNTER — Ambulatory Visit (INDEPENDENT_AMBULATORY_CARE_PROVIDER_SITE_OTHER): Payer: Medicaid Other | Admitting: Internal Medicine

## 2020-02-22 VITALS — BP 104/69 | HR 92 | Temp 97.9°F | Wt 158.0 lb

## 2020-02-22 DIAGNOSIS — Z3490 Encounter for supervision of normal pregnancy, unspecified, unspecified trimester: Secondary | ICD-10-CM

## 2020-02-22 DIAGNOSIS — B20 Human immunodeficiency virus [HIV] disease: Secondary | ICD-10-CM

## 2020-02-22 DIAGNOSIS — K219 Gastro-esophageal reflux disease without esophagitis: Secondary | ICD-10-CM | POA: Diagnosis not present

## 2020-02-22 NOTE — Progress Notes (Signed)
RFV: follow up for hiv disease and pregnancy  Patient ID: Mary Zhang, female   DOB: 1991-05-11, 29 y.o.   MRN: [id number]  HPI 29yo f with hiv disease, in her 2nd trimester. Continues to do well with triumeq doing well with her pregnancy. She denies missing doses. She has not yet had her covid vaccine since she thought it was available through the clinic. She occasionally gets abdominal pain that radiates to her right side. She denies n/v/heart burn. Otherwise pregnancy is going okay. She is here with her 70 yo daughter.  Outpatient Encounter Medications as of 02/22/2020  Medication Sig  . abacavir-dolutegravir-lamiVUDine (TRIUMEQ) 600-50-300 MG tablet Take 1 tablet by mouth daily.  Marland Kitchen acetaminophen (TYLENOL) 325 MG tablet Take 2 tablets (650 mg total) by mouth every 4 (four) hours as needed. (Patient not taking: Reported on 01/14/2020)  . AMBULATORY NON FORMULARY MEDICATION 1 Device by Other route once a week. Blood Pressure Cuff Medium Monitored Regularly at home ICD 10:Z34.90 (Patient not taking: Reported on 01/14/2020)  . doxylamine, Sleep, (UNISOM) 25 MG tablet Take 12.5 mg (0.5 tablets) every 4-6 hours as needed for nausea and vomiting. (Patient not taking: Reported on 04/23/2019)  . folic acid (FOLVITE) 1 MG tablet Take 1 tablet (1 mg total) by mouth daily.  Burnis Medin w/o A Vit-FeFum-FePo-FA (CONCEPT OB) 130-92.4-1 MG CAPS Take 1 capsule by mouth daily.  . vitamin B-6 (PYRIDOXINE) 25 MG tablet Take 1 tablet by mouth every 4-6 hours as needed for nausea. (Patient not taking: Reported on 03/10/2019)   No facility-administered encounter medications on file as of 02/22/2020.     Patient Active Problem List   Diagnosis Date Noted  . Short interval between pregnancies affecting pregnancy, antepartum 01/05/2020  . Supervision of high risk pregnancy, antepartum 01/05/2020  . History of preterm delivery 01/05/2020  . 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 Language barrier  01/22/2019  . History of preterm premature rupture of membranes 01/22/2019  . Gestational thrombocytopenia ([facility]) 01/22/2019  . Lead exposure 12/16/2015  . Raised antibody titer 10/25/2015     Health Maintenance Due  Topic Date Due  . COVID-19 Vaccine (1) Never done  . INFLUENZA VACCINE  02/21/2020   Social History   Tobacco Use  . Smoking status: Never Smoker  . Smokeless tobacco: Never Used  Vaping Use  . Vaping Use: Never used  Substance Use Topics  . Alcohol use: No  . Drug use: No    Review of Systems 12 point ros is negative except what is mentioned in hpi Physical Exam   LMP 10/25/2019 (Exact Date)   BP 104/69   Pulse 92   Temp 97.9 F (36.6 C) (Oral)   Wt 158 lb (71.7 kg)   LMP 10/25/2019 (Exact Date)   SpO2 100%   BMI 28.90 kg/m  Physical Exam  Constitutional:  oriented to person, place, and time. appears well-developed and well-nourished. No distress.  HENT: [place]/AT, PERRLA, no scleral icterus Mouth/Throat: Oropharynx is clear and moist. No oropharyngeal exudate.  Cardiovascular: Normal rate, regular rhythm and normal heart sounds. Exam reveals no gallop and no friction rub.  No murmur heard.  Pulmonary/Chest: Effort normal and breath sounds normal. No respiratory distress.  has no wheezes.  Neck = supple, no nuchal rigidity Abdominal: Soft. Bowel sounds are normal.  exhibits no distension. There is no tenderness. Gravid abdomen Lymphadenopathy: no cervical adenopathy. No axillary adenopathy Neurological: alert and oriented to person, place, and time.  Skin: Skin is  warm and dry. No rash noted. No erythema.  Psychiatric: a normal mood and affect.  behavior is normal.    Lab Results  Component Value Date   CD4TCELL 36 11/30/2019   Lab Results  Component Value Date   CD4TABS 370 (L) 11/30/2019   CD4TABS 469 10/07/2019   CD4TABS 429 12/04/2018   Lab Results  Component Value Date   HIV1RNAQUANT <20 NOT DETECTED 11/30/2019   No results found for:  HEPBSAB Lab Results  Component Value Date   LABRPR Non Reactive 01/05/2020    CBC Lab Results  Component Value Date   WBC 4.9 01/05/2020   RBC 4.22 01/05/2020   HGB 13.3 01/05/2020   HCT 37.9 01/05/2020   PLT 155 01/05/2020   MCV 90 01/05/2020   MCH 31.5 01/05/2020   MCHC 35.1 01/05/2020   RDW 12.9 01/05/2020   LYMPHSABS 1.2 01/05/2020   MONOABS 384 02/12/2017   EOSABS 0.0 01/05/2020    BMET Lab Results  Component Value Date   NA 137 11/30/2019   K 3.8 11/30/2019   CL 106 11/30/2019   CO2 26 11/30/2019   GLUCOSE 98 11/30/2019   BUN 10 11/30/2019   CREATININE 0.50 11/30/2019   CALCIUM 9.2 11/30/2019   GFRNONAA 131 10/07/2019   GFRAA 152 10/07/2019      Assessment and Plan hiv disease =  Will repeat hiv vl and cd 4 count to see that she is undetectable.  Abdominal discomfort = unclear if this is GERD vs cholestasis seen occasionally with pregnancy. Will check cmp.  Health maintenance = recommend to get covid vaccine through pharmacies

## 2020-02-22 NOTE — Patient Instructions (Signed)
Recommend to get covid vaccine through [facility] or [facility]

## 2020-02-23 LAB — T-HELPER CELL (CD4) - (RCID CLINIC ONLY)
CD4 % Helper T Cell: 32 % — ABNORMAL LOW (ref 33–65)
CD4 T Cell Abs: 364 /uL — ABNORMAL LOW (ref 400–1790)

## 2020-02-26 LAB — COMPLETE METABOLIC PANEL WITH GFR
AG Ratio: 1.4 (calc) (ref 1.0–2.5)
ALT: 15 U/L (ref 6–29)
AST: 15 U/L (ref 10–30)
Albumin: 3.8 g/dL (ref 3.6–5.1)
Alkaline phosphatase (APISO): 39 U/L (ref 31–125)
BUN/Creatinine Ratio: 12 (calc) (ref 6–22)
BUN: 5 mg/dL — ABNORMAL LOW (ref 7–25)
CO2: 24 mmol/L (ref 20–32)
Calcium: 8.8 mg/dL (ref 8.6–10.2)
Chloride: 105 mmol/L (ref 98–110)
Creat: 0.42 mg/dL — ABNORMAL LOW (ref 0.50–1.10)
GFR, Est African American: 163 mL/min/{1.73_m2} (ref >=60)
GFR, Est Non African American: 140 mL/min/{1.73_m2} (ref >=60)
Globulin: 2.7 g/dL (calc) (ref 1.9–3.7)
Glucose, Bld: 89 mg/dL (ref 65–99)
Potassium: 3.5 mmol/L (ref 3.5–5.3)
Sodium: 136 mmol/L (ref 135–146)
Total Bilirubin: 0.5 mg/dL (ref 0.2–1.2)
Total Protein: 6.5 g/dL (ref 6.1–8.1)

## 2020-02-26 LAB — HIV-1 RNA QUANT-NO REFLEX-BLD
HIV 1 RNA Quant: <20 Copies/mL
HIV-1 RNA Quant, Log: <1.3 Log cps/mL

## 2020-02-26 LAB — CBC WITH DIFFERENTIAL/PLATELET
Absolute Monocytes: 302 cells/uL (ref 200–950)
Basophils Absolute: 11 cells/uL (ref 0–200)
Basophils Relative: 0.2 %
Eosinophils Absolute: 40 cells/uL (ref 15–500)
Eosinophils Relative: 0.7 %
HCT: 37.4 % (ref 35.0–45.0)
Hemoglobin: 12.9 g/dL (ref 11.7–15.5)
Lymphs Abs: 1072 cells/uL (ref 850–3900)
MCH: 31.9 pg (ref 27.0–33.0)
MCHC: 34.5 g/dL (ref 32.0–36.0)
MCV: 92.6 fL (ref 80.0–100.0)
MPV: 11.2 fL (ref 7.5–12.5)
Monocytes Relative: 5.3 %
Neutro Abs: 4275 cells/uL (ref 1500–7800)
Neutrophils Relative %: 75 %
Platelets: 163 10*3/uL (ref 140–400)
RBC: 4.04 10*6/uL (ref 3.80–5.10)
RDW: 13.6 % (ref 11.0–15.0)
Total Lymphocyte: 18.8 %
WBC: 5.7 10*3/uL (ref 3.8–10.8)

## 2020-03-01 ENCOUNTER — Other Ambulatory Visit: Payer: Self-pay

## 2020-03-01 ENCOUNTER — Ambulatory Visit (INDEPENDENT_AMBULATORY_CARE_PROVIDER_SITE_OTHER): Payer: Medicaid Other | Admitting: Obstetrics and Gynecology

## 2020-03-01 VITALS — BP 91/54 | HR 86 | Wt 159.0 lb

## 2020-03-01 DIAGNOSIS — Z3A18 18 weeks gestation of pregnancy: Secondary | ICD-10-CM

## 2020-03-01 NOTE — Progress Notes (Signed)
   PRENATAL VISIT NOTE  Subjective:  Mary Zhang is a 29 y.o. Y6V7858 at [redacted]w[redacted]d being seen today for ongoing prenatal care.  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  Patient reports no complaints.  Contractions: Not present. Vag. Bleeding: None.  Movement: Absent. Denies leaking of fluid.   The following portions of the patient's history were reviewed and updated as appropriate: allergies, current medications, past family history, past medical history, past social history, past surgical history and problem list.   Objective:   Vitals:   03/01/20 1055  BP: (!) 91/54  Pulse: 86  Weight: 159 lb (72.1 kg)    Fetal Status: Fetal Heart Rate (bpm): 156   Movement: Absent     General:  Alert, oriented and cooperative. Patient is in no acute distress.  Skin: Skin is warm and dry. No rash noted.   Cardiovascular: Normal heart rate noted  Respiratory: Normal respiratory effort, no problems with respiration noted  Abdomen: Soft, gravid, appropriate for gestational age.  Pain/Pressure: Absent     Pelvic: Cervical exam deferred        Extremities: Normal range of motion.  Edema: None  Mental Status: Normal mood and affect. Normal behavior. Normal judgment and thought content.   Assessment and Plan:  Pregnancy: G3P1102 at [redacted]w[redacted]d 1. [redacted] weeks gestation of pregnancy  - AFP, Serum, Open Spina Bifida - [place] scheduled for 03/03/20 - Reviewed genetic screening - Doing well  - MD only - Scheduled her Covid vaccine today.   Preterm labor symptoms and general obstetric precautions including but  not limited to vaginal bleeding, contractions, leaking of fluid and fetal movement were reviewed in detail with the patient. Please refer to After Visit Summary for other counseling recommendations.   No follow-ups on file.  Future Appointments  Date Time Provider Department Center  03/03/2020 10:45 AM [facility] NURSE [facility]  03/03/2020 11:00 AM [facility] US1 [facility]  04/01/2020 10:15 AM Levie Heritage, DO [facility] None  05/25/2020 11:15 AM Judyann Munson, MD [facility]    Venia Carbon, NP

## 2020-03-01 NOTE — Patient Instructions (Signed)
[address] [place], [place]. [address] [phone]  3:00 on Saturday for Covid Vaccine    Dose 2  March 26, 2020 at 03:00 pm Same location.

## 2020-03-03 ENCOUNTER — Other Ambulatory Visit: Payer: Self-pay | Admitting: Pharmacist

## 2020-03-03 ENCOUNTER — Ambulatory Visit: Payer: Medicaid Other | Attending: Obstetrics and Gynecology

## 2020-03-03 ENCOUNTER — Ambulatory Visit: Payer: Medicaid Other | Admitting: *Deleted

## 2020-03-03 ENCOUNTER — Other Ambulatory Visit: Payer: Self-pay | Admitting: *Deleted

## 2020-03-03 ENCOUNTER — Other Ambulatory Visit: Payer: Self-pay

## 2020-03-03 DIAGNOSIS — Z363 Encounter for antenatal screening for malformations: Secondary | ICD-10-CM

## 2020-03-03 DIAGNOSIS — O0990 Supervision of high risk pregnancy, unspecified, unspecified trimester: Secondary | ICD-10-CM | POA: Diagnosis present

## 2020-03-03 DIAGNOSIS — O98719 Human immunodeficiency virus [HIV] disease complicating pregnancy, unspecified trimester: Secondary | ICD-10-CM | POA: Insufficient documentation

## 2020-03-03 DIAGNOSIS — Z3490 Encounter for supervision of normal pregnancy, unspecified, unspecified trimester: Secondary | ICD-10-CM

## 2020-03-03 DIAGNOSIS — O98712 Human immunodeficiency virus [HIV] disease complicating pregnancy, second trimester: Secondary | ICD-10-CM

## 2020-03-03 DIAGNOSIS — O09212 Supervision of pregnancy with history of pre-term labor, second trimester: Secondary | ICD-10-CM

## 2020-03-03 DIAGNOSIS — Z362 Encounter for other antenatal screening follow-up: Secondary | ICD-10-CM

## 2020-03-03 DIAGNOSIS — O09892 Supervision of other high risk pregnancies, second trimester: Secondary | ICD-10-CM

## 2020-03-03 DIAGNOSIS — Z3A18 18 weeks gestation of pregnancy: Secondary | ICD-10-CM

## 2020-03-03 DIAGNOSIS — O09292 Supervision of pregnancy with other poor reproductive or obstetric history, second trimester: Secondary | ICD-10-CM | POA: Diagnosis not present

## 2020-03-03 DIAGNOSIS — O09899 Supervision of other high risk pregnancies, unspecified trimester: Secondary | ICD-10-CM | POA: Diagnosis present

## 2020-03-03 DIAGNOSIS — Z8751 Personal history of pre-term labor: Secondary | ICD-10-CM | POA: Diagnosis present

## 2020-03-03 LAB — AFP, SERUM, OPEN SPINA BIFIDA
AFP MoM: 0.94
AFP Value: 41.4 ng/mL
Gest. Age on Collection Date: 18.2 weeks
Maternal Age At EDD: 27.8 yr
OSBR Risk 1 IN: 10000
Test Results:: NEGATIVE
Weight: 159 [lb_av]

## 2020-03-03 MED ORDER — FOLIVANE-OB 130-92.4-1 MG PO CAPS: 1.0000 | ORAL_CAPSULE | Freq: Every day | ORAL | 5 refills | Status: DC

## 2020-03-03 NOTE — Progress Notes (Signed)
Changing prenatal vitamin brands to something her insurance will pay for.

## 2020-04-01 ENCOUNTER — Ambulatory Visit (INDEPENDENT_AMBULATORY_CARE_PROVIDER_SITE_OTHER): Payer: Medicaid Other | Admitting: Family Medicine

## 2020-04-01 ENCOUNTER — Other Ambulatory Visit: Payer: Self-pay

## 2020-04-01 VITALS — BP 87/55 | HR 90 | Wt 159.0 lb

## 2020-04-01 DIAGNOSIS — Z8751 Personal history of pre-term labor: Secondary | ICD-10-CM

## 2020-04-01 DIAGNOSIS — Z3A22 22 weeks gestation of pregnancy: Secondary | ICD-10-CM

## 2020-04-01 DIAGNOSIS — O09899 Supervision of other high risk pregnancies, unspecified trimester: Secondary | ICD-10-CM

## 2020-04-01 DIAGNOSIS — O0990 Supervision of high risk pregnancy, unspecified, unspecified trimester: Secondary | ICD-10-CM

## 2020-04-01 DIAGNOSIS — O98719 Human immunodeficiency virus [HIV] disease complicating pregnancy, unspecified trimester: Secondary | ICD-10-CM

## 2020-04-01 DIAGNOSIS — Z789 Other specified health status: Secondary | ICD-10-CM

## 2020-04-01 NOTE — Progress Notes (Signed)
    PRENATAL VISIT NOTE  Subjective:  Mary Zhang is a 29 y.o. G3P1102 at [redacted]w[redacted]d being seen today for ongoing prenatal care.  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  Patient reports no complaints.  Contractions: Not present. Vag. Bleeding: None.  Movement: Present. Denies leaking of fluid.   The following portions of the patient's history were reviewed and updated as appropriate: allergies, current medications, past family history, past medical history, past social history, past surgical history and problem list.   Objective:   Vitals:   04/01/20 1014  BP: (!) 87/55  Pulse: 90  Weight: 159 lb (72.1 kg)    Fetal Status: Fetal Heart Rate (bpm): 145   Movement: Present     General:  Alert, oriented and cooperative. Patient is in no acute distress.  Skin: Skin is warm and dry. No rash noted.   Cardiovascular: Normal heart rate noted  Respiratory: Normal respiratory effort, no problems with respiration noted  Abdomen: Soft, gravid, appropriate for gestational age.  Pain/Pressure: Absent     Pelvic: Cervical exam deferred        Extremities: Normal range of motion.  Edema: None  Mental Status: Normal mood and affect. Normal behavior. Normal judgment and thought content.   Assessment and Plan:  Pregnancy: G3P1102 at [redacted]w[redacted]d 1. Supervision of high risk pregnancy, antepartum FHT and FH normal  2. [redacted] weeks gestation of pregnancy  3. Short interval between pregnancies affecting pregnancy, antepartum No symptoms of preterm labor.  4. History of preterm delivery Cervical length at  20 week [place] normal  5. HIV infection in mother during pregnancy, antepartum Viral load undetectable  6. Language barrier Interpreter used.  Preterm labor symptoms and general obstetric precautions including but not limited to vaginal bleeding, contractions, leaking of fluid and fetal movement were reviewed in detail with the patient. Please refer to After Visit Summary for other counseling recommendations.   Return in about 4 weeks (around 04/29/2020) for OB f/u, 2 hr GTT.  Future Appointments  Date Time Provider Department Center  04/04/2020 11:15 AM [facility] NURSE [facility]  04/04/2020 11:30 AM [facility] US3 [facility]  05/25/2020 11:15 AM Judyann Munson, MD [facility]    Levie Heritage, DO

## 2020-04-04 ENCOUNTER — Other Ambulatory Visit: Payer: Self-pay

## 2020-04-04 ENCOUNTER — Ambulatory Visit: Payer: Medicaid Other | Admitting: *Deleted

## 2020-04-04 ENCOUNTER — Other Ambulatory Visit: Payer: Self-pay | Admitting: *Deleted

## 2020-04-04 ENCOUNTER — Encounter: Payer: Self-pay | Admitting: *Deleted

## 2020-04-04 ENCOUNTER — Ambulatory Visit: Payer: Medicaid Other | Attending: Obstetrics and Gynecology

## 2020-04-04 DIAGNOSIS — Z3A23 23 weeks gestation of pregnancy: Secondary | ICD-10-CM

## 2020-04-04 DIAGNOSIS — O09892 Supervision of other high risk pregnancies, second trimester: Secondary | ICD-10-CM

## 2020-04-04 DIAGNOSIS — O98712 Human immunodeficiency virus [HIV] disease complicating pregnancy, second trimester: Secondary | ICD-10-CM

## 2020-04-04 DIAGNOSIS — O09899 Supervision of other high risk pregnancies, unspecified trimester: Secondary | ICD-10-CM | POA: Diagnosis present

## 2020-04-04 DIAGNOSIS — O0990 Supervision of high risk pregnancy, unspecified, unspecified trimester: Secondary | ICD-10-CM | POA: Diagnosis present

## 2020-04-04 DIAGNOSIS — Z362 Encounter for other antenatal screening follow-up: Secondary | ICD-10-CM | POA: Diagnosis present

## 2020-04-04 DIAGNOSIS — Z363 Encounter for antenatal screening for malformations: Secondary | ICD-10-CM

## 2020-04-04 DIAGNOSIS — O09292 Supervision of pregnancy with other poor reproductive or obstetric history, second trimester: Secondary | ICD-10-CM

## 2020-04-04 DIAGNOSIS — O09212 Supervision of pregnancy with history of pre-term labor, second trimester: Secondary | ICD-10-CM | POA: Diagnosis not present

## 2020-05-03 ENCOUNTER — Other Ambulatory Visit: Payer: Self-pay

## 2020-05-03 ENCOUNTER — Ambulatory Visit (INDEPENDENT_AMBULATORY_CARE_PROVIDER_SITE_OTHER): Payer: Medicaid Other | Admitting: Advanced Practice Midwife

## 2020-05-03 VITALS — BP 93/54 | HR 85 | Temp 98.6°F | Wt 163.0 lb

## 2020-05-03 DIAGNOSIS — O0990 Supervision of high risk pregnancy, unspecified, unspecified trimester: Secondary | ICD-10-CM

## 2020-05-03 DIAGNOSIS — Z23 Encounter for immunization: Secondary | ICD-10-CM

## 2020-05-03 DIAGNOSIS — Z789 Other specified health status: Secondary | ICD-10-CM

## 2020-05-03 DIAGNOSIS — O98719 Human immunodeficiency virus [HIV] disease complicating pregnancy, unspecified trimester: Secondary | ICD-10-CM

## 2020-05-03 DIAGNOSIS — Z603 Acculturation difficulty: Secondary | ICD-10-CM

## 2020-05-03 DIAGNOSIS — Z3A27 27 weeks gestation of pregnancy: Secondary | ICD-10-CM

## 2020-05-03 DIAGNOSIS — B20 Human immunodeficiency virus [HIV] disease: Secondary | ICD-10-CM

## 2020-05-03 NOTE — Patient Instructions (Signed)
Preventing Influenza, Adult Influenza, more commonly known as "the flu," is a viral infection that mainly affects the respiratory tract. The respiratory tract includes structures that help you breathe, such as the lungs, nose, and throat. The flu causes many common cold symptoms, as well as a high fever and body aches. The flu spreads easily from person to person (is contagious). The flu is most common from December through March. This is called flu season.You can catch the flu virus by:  Breathing in droplets from an infected person's cough or sneeze.  Touching something that was recently contaminated with the virus and then touching your mouth, nose, or eyes. What can I do to lower my risk?        You can decrease your risk of getting the flu by:  Getting a flu shot (influenza vaccination) every year. This is the best way to prevent the flu. A flu shot is recommended for everyone age 6 months and older. ? It is best to get a flu shot in the fall, as soon as it is available. Getting a flu shot during winter or spring instead is still a good idea. Flu season can last into early spring. ? Preventing the flu through vaccination requires getting a new flu shot every year. This is because the flu virus changes slightly (mutates) from one year to the next. Even if a flu shot does not completely protect you from all flu virus mutations, it can reduce the severity of your illness and prevent dangerous complications of the flu. ? If you are pregnant, you can and should get a flu shot. ? If you have had a reaction to the shot in the past or if you are allergic to eggs, check with your health care provider before getting a flu shot. ? Sometimes the vaccine is available as a nasal spray. In some years, the nasal spray has not been as effective against the flu virus. Check with your health care provider if you have questions about this.  Practicing good health habits. This is especially important during  flu season. ? Avoid contact with people who are sick with flu or cold symptoms. ? Wash your hands with soap and water often. If soap and water are not available, use alcohol-based hand sanitizer. ? Avoid touching your hands to your face, especially when you have not washed your hands recently. ? Use a disinfectant to clean surfaces at home and at work that may be contaminated with the flu virus. ? Keep your body's disease-fighting system (immune system) in good shape by eating a healthy diet, drinking plenty of fluids, getting enough sleep, and exercising regularly. If you do get the flu, avoid spreading it to others by:  Staying home until your symptoms have been gone for at least one day.  Covering your mouth and nose when you cough or sneeze.  Avoiding close contact with others, especially babies and elderly people. Why are these changes important? Getting a flu shot and practicing good health habits protects you as well as other people. If you get the flu, your friends, family, and co-workers are also at risk of getting it, because it spreads so easily to others. Each year, about 2 out of every 10 people get the flu. Having the flu can lead to complications, such as pneumonia, ear infection, and sinus infection. The flu also can be deadly, especially for babies, people older than age 65, and people who have serious long-term diseases. How is this treated? Most   people recover from the flu by resting at home and drinking plenty of fluids. However, a prescription antiviral medicine may reduce your flu symptoms and may make your flu go away sooner. This medicine must be started within a few days of getting flu symptoms. You can talk with your health care provider about whether you need an antiviral medicine. Antiviral medicine may be prescribed for people who are at risk for more serious flu symptoms. This includes people who:  Are older than age 765.  Are pregnant.  Have a condition that  makes the flu worse or more dangerous. Where to find more information  Centers for Disease Control and Prevention: tsavxtf.comwww.cdc.gov/flu/index.htm  ItsBlog.frFlu.gov: InternetEnthusiasts.huwww.flu.gov/prevention-vaccination  American Academy of Family Physicians: familydoctor.org/familydoctor/en/kids/vaccines/preventing-the-flu.html Contact a health care provider if:  You have influenza and you develop new symptoms.  You have: ? Chest pain. ? Diarrhea. ? A fever.  Your cough gets worse, or you produce more mucus. Summary  The best way to prevent the flu is to get a flu shot every year in the fall.  Even if you get the flu after you have received the yearly vaccine, your flu may be milder and go away sooner because of your flu shot.  If you get the flu, antiviral medicines that are started with a few days of symptoms may reduce your flu symptoms and may make your flu go away sooner.  You can also help prevent the flu by practicing good health habits. This information is not intended to replace advice given to you by your health care provider. Make sure you discuss any questions you have with your health care provider. Document Revised: 06/21/2017 Document Reviewed: 03/17/2016 Elsevier Patient Education  2020 [employer] https://www.cdc.gov/vaccines/hcp/vis/vis-statements/tdap.pdf">  Tdap (Tetanus, Diphtheria, Pertussis) Vaccine: What You Need to Know 1. Why get vaccinated? Tdap vaccine can prevent tetanus, diphtheria, and pertussis. Diphtheria and pertussis spread from person to person. Tetanus enters the body through cuts or wounds.  TETANUS (T) causes painful stiffening of the muscles. Tetanus can lead to serious health problems, including being unable to open the mouth, having trouble swallowing and breathing, or death.  DIPHTHERIA (D) can lead to difficulty breathing, heart failure, paralysis, or death.  PERTUSSIS (aP), also known as "whooping cough," can cause uncontrollable, violent coughing which makes it  hard to breathe, eat, or drink. Pertussis can be extremely serious in babies and young children, causing pneumonia, convulsions, brain damage, or death. In teens and adults, it can cause weight loss, loss of bladder control, passing out, and rib fractures from severe coughing. 2. Tdap vaccine Tdap is only for children 7 years and older, adolescents, and adults.  Adolescents should receive a single dose of Tdap, preferably at age 27 or 12 years. Pregnant women should get a dose of Tdap during every pregnancy, to protect the newborn from pertussis. Infants are most at risk for severe, life-threatening complications from pertussis. Adults who have never received Tdap should get a dose of Tdap. Also, adults should receive a booster dose every 10 years, or earlier in the case of a severe and dirty wound or burn. Booster doses can be either Tdap or Td (a different vaccine that protects against tetanus and diphtheria but not pertussis). Tdap may be given at the same time as other vaccines. 3. Talk with your health care provider Tell your vaccine provider if the person getting the vaccine:  Has had an allergic reaction after a previous dose of any vaccine that protects against tetanus, diphtheria, or pertussis,  or has any severe, life-threatening allergies.  Has had a coma, decreased level of consciousness, or prolonged seizures within 7 days after a previous dose of any pertussis vaccine (DTP, DTaP, or Tdap).  Has seizures or another nervous system problem.  Has ever had Guillain-Barr Syndrome (also called GBS).  Has had severe pain or swelling after a previous dose of any vaccine that protects against tetanus or diphtheria. In some cases, your health care provider may decide to postpone Tdap vaccination to a future visit.  People with minor illnesses, such as a cold, may be vaccinated. People who are moderately or severely ill should usually wait until they recover before getting Tdap vaccine.  Your  health care provider can give you more information. 4. Risks of a vaccine reaction  Pain, redness, or swelling where the shot was given, mild fever, headache, feeling tired, and nausea, vomiting, diarrhea, or stomachache sometimes happen after Tdap vaccine. People sometimes faint after medical procedures, including vaccination. Tell your provider if you feel dizzy or have vision changes or ringing in the ears.  As with any medicine, there is a very remote chance of a vaccine causing a severe allergic reaction, other serious injury, or death. 5. What if there is a serious problem? An allergic reaction could occur after the vaccinated person leaves the clinic. If you see signs of a severe allergic reaction (hives, swelling of the face and throat, difficulty breathing, a fast heartbeat, dizziness, or weakness), call 9-1-1 and get the person to the nearest hospital. For other signs that concern you, call your health care provider.  Adverse reactions should be reported to the Vaccine Adverse Event Reporting System (VAERS). Your health care provider will usually file this report, or you can do it yourself. Visit the VAERS website at www.vaers.LAgents.no or call 571-603-3994. VAERS is only for reporting reactions, and VAERS staff do not give medical advice. 6. The National Vaccine Injury Compensation Program The [employer] Vaccine Injury Compensation Program (VICP) is a federal program that was created to compensate people who may have been injured by certain vaccines. Visit the VICP website at SpiritualWord.at or call (848)249-8224 to learn about the program and about filing a claim. There is a time limit to file a claim for compensation. 7. How can I learn more?  Ask your health care provider.  Call your local or state health department.  Contact the Centers for Disease Control and Prevention (CDC): ? Call 226-343-3858 (1-800-CDC-INFO) or ? Visit CDC's website at  PicCapture.uy Vaccine Information Statement Tdap (Tetanus, Diphtheria, Pertussis) Vaccine (10/22/2018) This information is not intended to replace advice given to you by your health care provider. Make sure you discuss any questions you have with your health care provider. Document Revised: 10/31/2018 Document Reviewed: 11/03/2018 Elsevier Patient Education  2020 Elsevier Inc. Glucose Tolerance Test During Pregnancy Why am I having this test? The glucose tolerance test (GTT) is done to check how your body processes sugar (glucose). This is one of several tests used to diagnose diabetes that develops during pregnancy (gestational diabetes mellitus). Gestational diabetes is a temporary form of diabetes that some women develop during pregnancy. It usually occurs during the second trimester of pregnancy and goes away after delivery. Testing (screening) for gestational diabetes usually occurs between 24 and 28 weeks of pregnancy. You may have the GTT test after having a 1-hour glucose screening test if the results from that test indicate that you may have gestational diabetes. You may also have this test if:  You have  a history of gestational diabetes.  You have a history of giving birth to very large babies or have experienced repeated fetal loss (stillbirth).  You have signs and symptoms of diabetes, such as: ? Changes in your vision. ? Tingling or numbness in your hands or feet. ? Changes in hunger, thirst, and urination that are not otherwise explained by your pregnancy. What is being tested? This test measures the amount of glucose in your blood at different times during a period of 3 hours. This indicates how well your body is able to process glucose. What kind of sample is taken?  Blood samples are required for this test. They are usually collected by inserting a needle into a blood vessel. How do I prepare for this test?  For 3 days before your test, eat normally. Have plenty of  carbohydrate-rich foods.  Follow instructions from your health care provider about: ? Eating or drinking restrictions on the day of the test. You may be asked to not eat or drink anything other than water (fast) starting 8-10 hours before the test. ? Changing or stopping your regular medicines. Some medicines may interfere with this test. Tell a health care provider about:  All medicines you are taking, including vitamins, herbs, eye drops, creams, and over-the-counter medicines.  Any blood disorders you have.  Any surgeries you have had.  Any medical conditions you have. What happens during the test? First, your blood glucose will be measured. This is referred to as your fasting blood glucose, since you fasted before the test. Then, you will drink a glucose solution that contains a certain amount of glucose. Your blood glucose will be measured again 1, 2, and 3 hours after drinking the solution. This test takes about 3 hours to complete. You will need to stay at the testing location during this time. During the testing period:  Do not eat or drink anything other than the glucose solution.  Do not exercise.  Do not use any products that contain nicotine or tobacco, such as cigarettes and e-cigarettes. If you need help stopping, ask your health care provider. The testing procedure may vary among health care providers and hospitals. How are the results reported? Your results will be reported as milligrams of glucose per deciliter of blood (mg/dL) or millimoles per liter (mmol/L). Your health care provider will compare your results to normal ranges that were established after testing a large group of people (reference ranges). Reference ranges may vary among labs and hospitals. For this test, common reference ranges are:  Fasting: less than 95-105 mg/dL (1.0-1.7 mmol/L).  1 hour after drinking glucose: less than 180-190 mg/dL (51.0-25.8 mmol/L).  2 hours after drinking glucose: less than  155-165 mg/dL (5.2-7.7 mmol/L).  3 hours after drinking glucose: 140-145 mg/dL (8.2-4.2 mmol/L). What do the results mean? Results within reference ranges are considered normal, meaning that your glucose levels are well-controlled. If two or more of your blood glucose levels are high, you may be diagnosed with gestational diabetes. If only one level is high, your health care provider may suggest repeat testing or other tests to confirm a diagnosis. Talk with your health care provider about what your results mean. Questions to ask your health care provider Ask your health care provider, or the department that is doing the test:  When will my results be ready?  How will I get my results?  What are my treatment options?  What other tests do I need?  What are my next steps? Summary  The glucose tolerance test (GTT) is one of several tests used to diagnose diabetes that develops during pregnancy (gestational diabetes mellitus). Gestational diabetes is a temporary form of diabetes that some women develop during pregnancy.  You may have the GTT test after having a 1-hour glucose screening test if the results from that test indicate that you may have gestational diabetes. You may also have this test if you have any symptoms or risk factors for gestational diabetes.  Talk with your health care provider about what your results mean. This information is not intended to replace advice given to you by your health care provider. Make sure you discuss any questions you have with your health care provider. Document Revised: 10/30/2018 Document Reviewed: 02/18/2017 Elsevier Patient Education  2020 [employer].

## 2020-05-03 NOTE — Progress Notes (Signed)
   PRENATAL VISIT NOTE  Subjective:  Mary Zhang is a 29 y.o. G3P1102 at [redacted]w[redacted]d being seen today for ongoing prenatal care.  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  Patient reports no complaints.  Contractions: Not present. Vag. Bleeding: None.  Movement: Present. Denies leaking of fluid.   The following portions of the patient's history were reviewed and updated as appropriate: allergies, current medications, past family history, past medical history, past social history, past surgical history and problem list.   Objective:   Vitals:   05/03/20 0930  BP: (!) 93/54  Pulse: 85  Temp: 98.6 F (37 C)  Weight: 163 lb (73.9 kg)    Fetal Status: Fetal Heart Rate (bpm): 147   Movement: Present     General:  Alert, oriented and cooperative. Patient is in no acute distress.  Skin: Skin is warm and dry. No rash noted.   Cardiovascular: Normal heart rate noted  Respiratory: Normal respiratory effort, no problems with respiration noted  Abdomen: Soft, gravid, appropriate for gestational age.  Pain/Pressure: Absent     Pelvic: Cervical exam deferred        Extremities: Normal range of motion.  Edema: None  Mental Status: Normal mood and affect. Normal behavior. Normal judgment and thought content.   Assessment and Plan:  Pregnancy: G3P1102 at [redacted]w[redacted]d 1. Supervision of high risk pregnancy, antepartum  - Glucose Tolerance, 2 Hours w/1 Hour - CBC - RPR - Tdap vaccine greater than or equal to 7yo IM  2. Need for immunization against influenza  - Flu Vaccine QUAD 36+ mos IM  3. Need  for tetanus, diphtheria, and acellular pertussis (Tdap) vaccine in patient of adolescent age or older   4. Maternal HIV positive complicating pregnancy (HCC) Followed by ID  5. Language barrier Interpretor present  6. [redacted] weeks gestation of pregnancy   Preterm labor symptoms and general obstetric precautions including but not limited to vaginal bleeding, contractions, leaking of fluid and fetal movement were reviewed in detail with the patient. Please refer to After Visit Summary for other counseling recommendations.   No follow-ups on file.  Future Appointments  Date Time Provider Department Center  05/04/2020 10:45 AM [facility] NURSE [facility]  05/04/2020 11:00 AM [facility] US1 [facility]  05/25/2020 11:15 AM Judyann Munson, MD [facility]    Wynelle Bourgeois, CNM

## 2020-05-04 ENCOUNTER — Ambulatory Visit: Payer: Medicaid Other | Attending: Obstetrics and Gynecology

## 2020-05-04 ENCOUNTER — Other Ambulatory Visit: Payer: Self-pay | Admitting: *Deleted

## 2020-05-04 ENCOUNTER — Ambulatory Visit: Payer: Medicaid Other | Admitting: *Deleted

## 2020-05-04 DIAGNOSIS — O09212 Supervision of pregnancy with history of pre-term labor, second trimester: Secondary | ICD-10-CM | POA: Diagnosis not present

## 2020-05-04 DIAGNOSIS — O98719 Human immunodeficiency virus [HIV] disease complicating pregnancy, unspecified trimester: Secondary | ICD-10-CM

## 2020-05-04 DIAGNOSIS — O09292 Supervision of pregnancy with other poor reproductive or obstetric history, second trimester: Secondary | ICD-10-CM | POA: Diagnosis not present

## 2020-05-04 DIAGNOSIS — O09899 Supervision of other high risk pregnancies, unspecified trimester: Secondary | ICD-10-CM

## 2020-05-04 DIAGNOSIS — O0990 Supervision of high risk pregnancy, unspecified, unspecified trimester: Secondary | ICD-10-CM | POA: Diagnosis present

## 2020-05-04 DIAGNOSIS — Z363 Encounter for antenatal screening for malformations: Secondary | ICD-10-CM

## 2020-05-04 DIAGNOSIS — Z21 Asymptomatic human immunodeficiency virus [HIV] infection status: Secondary | ICD-10-CM

## 2020-05-04 DIAGNOSIS — O09892 Supervision of other high risk pregnancies, second trimester: Secondary | ICD-10-CM | POA: Diagnosis not present

## 2020-05-04 DIAGNOSIS — O98712 Human immunodeficiency virus [HIV] disease complicating pregnancy, second trimester: Secondary | ICD-10-CM

## 2020-05-04 DIAGNOSIS — Z3A27 27 weeks gestation of pregnancy: Secondary | ICD-10-CM

## 2020-05-04 LAB — GLUCOSE TOLERANCE, 2 HOURS W/ 1HR
Glucose, 1 hour: 139 mg/dL (ref 65–179)
Glucose, 2 hour: 101 mg/dL (ref 65–152)
Glucose, Fasting: 78 mg/dL (ref 65–91)

## 2020-05-04 LAB — RPR: RPR Ser Ql: NONREACTIVE

## 2020-05-04 LAB — CBC
Hematocrit: 38.8 % (ref 34.0–46.6)
Hemoglobin: 13.2 g/dL (ref 11.1–15.9)
MCH: 31.8 pg (ref 26.6–33.0)
MCHC: 34 g/dL (ref 31.5–35.7)
MCV: 94 fL (ref 79–97)
Platelets: 139 10*3/uL — ABNORMAL LOW (ref 150–450)
RBC: 4.15 x10E6/uL (ref 3.77–5.28)
RDW: 12.6 % (ref 11.7–15.4)
WBC: 7.1 10*3/uL (ref 3.4–10.8)

## 2020-05-05 ENCOUNTER — Encounter: Payer: Self-pay | Admitting: Advanced Practice Midwife

## 2020-05-17 ENCOUNTER — Other Ambulatory Visit: Payer: Self-pay

## 2020-05-17 ENCOUNTER — Ambulatory Visit (INDEPENDENT_AMBULATORY_CARE_PROVIDER_SITE_OTHER): Payer: Medicaid Other | Admitting: Advanced Practice Midwife

## 2020-05-17 VITALS — BP 93/53 | HR 88 | Wt 164.0 lb

## 2020-05-17 DIAGNOSIS — B20 Human immunodeficiency virus [HIV] disease: Secondary | ICD-10-CM

## 2020-05-17 DIAGNOSIS — O98719 Human immunodeficiency virus [HIV] disease complicating pregnancy, unspecified trimester: Secondary | ICD-10-CM

## 2020-05-17 DIAGNOSIS — Z8751 Personal history of pre-term labor: Secondary | ICD-10-CM

## 2020-05-17 DIAGNOSIS — O09899 Supervision of other high risk pregnancies, unspecified trimester: Secondary | ICD-10-CM

## 2020-05-17 DIAGNOSIS — O0990 Supervision of high risk pregnancy, unspecified, unspecified trimester: Secondary | ICD-10-CM | POA: Diagnosis not present

## 2020-05-17 NOTE — Progress Notes (Signed)
   PRENATAL VISIT NOTE  Subjective:  Mary Zhang is a 29 y.o. G3P1102 at [redacted]w[redacted]d being seen today for ongoing prenatal care.  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  Patient reports no complaints.  Contractions: Not present. Vag. Bleeding: None.  Movement: Present. Denies leaking of fluid.   The following portions of the patient's history were reviewed and updated as appropriate: allergies, current medications, past family history, past medical history, past social history, past surgical history and problem list.   Objective:   Vitals:   05/17/20 0849  BP: (!) 93/53  Pulse: 88  Weight: 164 lb (74.4 kg)    Fetal Status:     Movement: Present     General:  Alert, oriented and cooperative. Patient is in no acute distress.  Skin: Skin is warm and dry. No rash noted.   Cardiovascular: Normal heart rate noted  Respiratory: Normal respiratory effort, no problems with respiration noted  Abdomen: Soft, gravid, appropriate for gestational age.  Pain/Pressure: Absent     Pelvic: Cervical exam deferred        Extremities: Normal range of motion.  Edema: None  Mental Status: Normal mood and affect. Normal behavior. Normal judgment and thought content.   Assessment and Plan:  Pregnancy: G3P1102 at [redacted]w[redacted]d 1. Short interval between pregnancies affecting pregnancy, antepartum   2. Supervision of high risk pregnancy, antepartum      Glucose Tolerance test normal   3. Maternal HIV positive complicating pregnancy (HCC)     Followed by ID  4. History of preterm  delivery     PTL precautions, location of [facility] ED  5.  COVID-19 Vaccine Counseling: The patient was counseled on the potential benefits and lack of known risks of COVID vaccination, during pregnancy and breastfeeding, during today's visit. The patient's questions and concerns were addressed today, including safety of the vaccination and potential side effects as they have been published by ACOG and [facility]. The patient has been informed that there have not been any documented vaccine related injuries, deaths or birth defects to infant or mom after receiving the COVID-19 vaccine to date. The patient has been made aware that although she is not at increased risk of contracting COVID-19 during pregnancy, she is at increased risk of developing severe disease and complications if she contracts COVID-19 while pregnant. All patient questions were addressed during our visit today. The patient is planning to get vaccinated.    bill 00938 with 25 modifier for in-person and CR modifier    Preterm labor symptoms and general obstetric precautions including but not limited to vaginal bleeding, contractions, leaking of fluid and fetal movement were reviewed in detail with the patient. Please refer to After Visit Summary for other counseling recommendations.   Return in about 2 weeks (around 05/31/2020) for [employer].  Future Appointments  Date Time Provider Department Center  05/25/2020 11:15 AM Judyann Munson, MD [facility]  06/06/2020  9:45 AM [facility] NURSE [facility]  06/06/2020 10:00 AM [facility] US1 [facility]    Wynelle Bourgeois, CNM

## 2020-05-17 NOTE — Patient Instructions (Signed)
Third Trimester of Pregnancy  The third trimester is from week 28 through week 40 (months 7 through 9). This trimester is when your unborn baby (fetus) is growing very fast. At the end of the ninth month, the unborn baby is about 20 inches in length. It weighs about 6-10 pounds. Follow these instructions at home: Medicines  Take over-the-counter and prescription medicines only as told by your doctor. Some medicines are safe and some medicines are not safe during pregnancy.  Take a prenatal vitamin that contains at least 600 micrograms (mcg) of folic acid.  If you have trouble pooping (constipation), take medicine that will make your stool soft (stool softener) if your doctor approves. Eating and drinking   Eat regular, healthy meals.  Avoid raw meat and uncooked cheese.  If you get low calcium from the food you eat, talk to your doctor about taking a daily calcium supplement.  Eat four or five small meals rather than three large meals a day.  Avoid foods that are high in fat and sugars, such as fried and sweet foods.  To prevent constipation: ? Eat foods that are high in fiber, like fresh fruits and vegetables, whole grains, and beans. ? Drink enough fluids to keep your pee (urine) clear or pale yellow. Activity  Exercise only as told by your doctor. Stop exercising if you start to have cramps.  Avoid heavy lifting, wear low heels, and sit up straight.  Do not exercise if it is too hot, too humid, or if you are in a place of great height (high altitude).  You may continue to have sex unless your doctor tells you not to. Relieving pain and discomfort  Wear a good support bra if your breasts are tender.  Take frequent breaks and rest with your legs raised if you have leg cramps or low back pain.  Take warm water baths (sitz baths) to soothe pain or discomfort caused by hemorrhoids. Use hemorrhoid cream if your doctor approves.  If you develop puffy, bulging veins (varicose  veins) in your legs: ? Wear support hose or compression stockings as told by your doctor. ? Raise (elevate) your feet for 15 minutes, 3-4 times a day. ? Limit salt in your food. Safety  Wear your seat belt when driving.  Make a list of emergency phone numbers, including numbers for family, friends, the hospital, and police and fire departments. Preparing for your baby's arrival To prepare for the arrival of your baby:  Take prenatal classes.  Practice driving to the hospital.  Visit the hospital and tour the maternity area.  Talk to your work about taking leave once the baby comes.  Pack your hospital bag.  Prepare the baby's room.  Go to your doctor visits.  Buy a rear-facing car seat. Learn how to install it in your car. General instructions  Do not use hot tubs, steam rooms, or saunas.  Do not use any products that contain nicotine or tobacco, such as cigarettes and e-cigarettes. If you need help quitting, ask your doctor.  Do not drink alcohol.  Do not douche or use tampons or scented sanitary pads.  Do not cross your legs for long periods of time.  Do not travel for long distances unless you must. Only do so if your doctor says it is okay.  Visit your dentist if you have not gone during your pregnancy. Use a soft toothbrush to brush your teeth. Be gentle when you floss.  Avoid cat litter boxes and soil   used by cats. These carry germs that can cause birth defects in the baby and can cause a loss of your baby (miscarriage) or stillbirth.  Keep all your prenatal visits as told by your doctor. This is important. Contact a doctor if:  You are not sure if you are in labor or if your water has broken.  You are dizzy.  You have mild cramps or pressure in your lower belly.  You have a nagging pain in your belly area.  You continue to feel sick to your stomach, you throw up, or you have watery poop.  You have bad smelling fluid coming from your vagina.  You have  pain when you pee. Get help right away if:  You have a fever.  You are leaking fluid from your vagina.  You are spotting or bleeding from your vagina.  You have severe belly cramps or pain.  You lose or gain weight quickly.  You have trouble catching your breath and have chest pain.  You notice sudden or extreme puffiness (swelling) of your face, hands, ankles, feet, or legs.  You have not felt the baby move in over an hour.  You have severe headaches that do not go away with medicine.  You have trouble seeing.  You are leaking, or you are having a gush of fluid, from your vagina before you are 37 weeks.  You have regular belly spasms (contractions) before you are 37 weeks. Summary  The third trimester is from week 28 through week 40 (months 7 through 9). This time is when your unborn baby is growing very fast.  Follow your doctor's advice about medicine, food, and activity.  Get ready for the arrival of your baby by taking prenatal classes, getting all the baby items ready, preparing the baby's room, and visiting your doctor to be checked.  Get help right away if you are bleeding from your vagina, or you have chest pain and trouble catching your breath, or if you have not felt your baby move in over an hour. This information is not intended to replace advice given to you by your health care provider. Make sure you discuss any questions you have with your health care provider. Document Revised: 10/30/2018 Document Reviewed: 08/14/2016 Elsevier Patient Education  2020 [employer]. Preterm Labor and Birth Information Pregnancy normally lasts 39-41 weeks. Preterm labor is when labor starts early. It starts before you have been pregnant for 37 whole weeks. What are the risk factors for preterm labor? Preterm labor is more likely to occur in women who:  Have an infection while pregnant.  Have a cervix that is short.  Have gone into preterm labor before.  Have had surgery  on their cervix.  Are younger than age 70.  Are older than age 29.  Are African American.  Are pregnant with two or more babies.  Take street drugs while pregnant.  Smoke while pregnant.  Do not gain enough weight while pregnant.  Got pregnant right after another pregnancy. What are the symptoms of preterm labor? Symptoms of preterm labor include:  Cramps. The cramps may feel like the cramps some women get during their period. The cramps may happen with watery poop (diarrhea).  Pain in the belly (abdomen).  Pain in the lower back.  Regular contractions or tightening. It may feel like your belly is getting tighter.  Pressure in the lower belly that seems to get stronger.  More fluid (discharge) leaking from the vagina. The fluid may be  watery or bloody.  Water breaking. Why is it important to notice signs of preterm labor? Babies who are born early may not be fully developed. They have a higher chance for:  Long-term heart problems.  Long-term lung problems.  Trouble controlling body systems, like breathing.  Bleeding in the brain.  A condition called cerebral palsy.  Learning difficulties.  Death. These risks are highest for babies who are born before 34 weeks of pregnancy. How is preterm labor treated? Treatment depends on:  How long you were pregnant.  Your condition.  The health of your baby. Treatment may involve:  Having a stitch (suture) placed in your cervix. When you give birth, your cervix opens so the baby can come out. The stitch keeps the cervix from opening too soon.  Staying at the hospital.  Taking or getting medicines, such as: ? Hormone medicines. ? Medicines to stop contractions. ? Medicines to help the baby's lungs develop. ? Medicines to prevent your baby from having cerebral palsy. What should I do if I am in preterm labor? If you think you are going into labor too soon, call your doctor right away. How can I prevent preterm  labor?  Do not use any tobacco products. ? Examples of these are cigarettes, chewing tobacco, and e-cigarettes. ? If you need help quitting, ask your doctor.  Do not use street drugs.  Do not use any medicines unless you ask your doctor if they are safe for you.  Talk with your doctor before taking any herbal supplements.  Make sure you gain enough weight.  Watch for infection. If you think you might have an infection, get it checked right away.  If you have gone into preterm labor before, tell your doctor. This information is not intended to replace advice given to you by your health care provider. Make sure you discuss any questions you have with your health care provider. Document Revised: 10/31/2018 Document Reviewed: 11/30/2015 Elsevier Patient Education  2020 [employer].

## 2020-05-25 ENCOUNTER — Other Ambulatory Visit: Payer: Self-pay

## 2020-05-25 ENCOUNTER — Encounter: Payer: Self-pay | Admitting: Internal Medicine

## 2020-05-25 ENCOUNTER — Ambulatory Visit (INDEPENDENT_AMBULATORY_CARE_PROVIDER_SITE_OTHER): Payer: Medicaid Other | Admitting: Internal Medicine

## 2020-05-25 ENCOUNTER — Ambulatory Visit (INDEPENDENT_AMBULATORY_CARE_PROVIDER_SITE_OTHER): Payer: Medicaid Other

## 2020-05-25 VITALS — BP 101/67 | HR 102 | Temp 98.0°F | Wt 164.0 lb

## 2020-05-25 DIAGNOSIS — B20 Human immunodeficiency virus [HIV] disease: Secondary | ICD-10-CM

## 2020-05-25 DIAGNOSIS — Z3490 Encounter for supervision of normal pregnancy, unspecified, unspecified trimester: Secondary | ICD-10-CM

## 2020-05-25 DIAGNOSIS — Z79899 Other long term (current) drug therapy: Secondary | ICD-10-CM

## 2020-05-25 DIAGNOSIS — Z23 Encounter for immunization: Secondary | ICD-10-CM

## 2020-05-25 NOTE — Progress Notes (Signed)
RFV: follow up for hiv disease  Patient ID: Mary Zhang, female   DOB: [birth year], 29 y.o.   MRN: [id number]  HPI Mary Zhang is s 29yo F with well controlled HIV disease, on triumeq and also at nearly [redacted] wk gestation. There was confusion in terms of her prenatal vitamin. She has only been taking her hiv medication and folic acid. Her pregnancy is going well thus far. Denies acid reflux. Has not had her covid vaccine  Yet but did get flu vaccine  Outpatient Encounter Medications as of 05/25/2020  Medication Sig   abacavir-dolutegravir-lamiVUDine (TRIUMEQ) 600-50-300 MG tablet Take 1 tablet by mouth daily.   folic acid (FOLVITE) 1 MG tablet Take 1 tablet (1 mg total) by mouth daily.   Prenat w/o A Vit-FeFum-FePo-FA (FOLIVANE-OB) 85-1 MG CAPS Take 1 capsule by mouth daily.   acetaminophen (TYLENOL) 325 MG tablet Take 2 tablets (650 mg total) by mouth every 4 (four) hours as needed. (Patient not taking: Reported on 01/14/2020)   AMBULATORY NON FORMULARY MEDICATION 1 Device by Other route once a week. Blood Pressure Cuff Medium Monitored Regularly at home ICD 10:Z34.90 (Patient not taking: Reported on 01/14/2020)   doxylamine, Sleep, (UNISOM) 25 MG tablet Take 12.5 mg (0.5 tablets) every 4-6 hours as needed for nausea and vomiting. (Patient not taking: Reported on 04/23/2019)   vitamin B-6 (PYRIDOXINE) 25 MG tablet Take 1 tablet by mouth every 4-6 hours as needed for nausea. (Patient not taking: Reported on 05/25/2020)   No facility-administered encounter medications on file as of 05/25/2020.     Patient Active Problem List   Diagnosis Date Noted   Short interval between pregnancies affecting pregnancy, antepartum 01/05/2020   Supervision of high risk pregnancy, antepartum 01/05/2020   History of preterm delivery 01/05/2020   HIV infection in mother during pregnancy, antepartum 01/22/2019   Language barrier 01/22/2019   History of preterm premature rupture of membranes 01/22/2019    Thrombocytopenia affecting pregnancy, antepartum (HCC) 01/22/2019   Lead exposure 12/16/2015   Raised antibody titer 10/25/2015     Health Maintenance Due  Topic Date Due   COVID-19 Vaccine (1) Never done     Review of Systems Review of Systems  Constitutional: Negative for fever, chills, diaphoresis, activity change, appetite change, fatigue and unexpected weight change.  HENT: Negative for congestion, sore throat, rhinorrhea, sneezing, trouble swallowing and sinus pressure.  Eyes: Negative for photophobia and visual disturbance.  Respiratory: Negative for cough, chest tightness, shortness of breath, wheezing and stridor.  Cardiovascular: Negative for chest pain, palpitations and leg swelling.  Gastrointestinal: Negative for nausea, vomiting, abdominal pain, diarrhea, constipation, blood in stool, abdominal distention and anal bleeding.  Genitourinary: Negative for dysuria, hematuria, flank pain and difficulty urinating.  Musculoskeletal: Negative for myalgias, back pain, joint swelling, arthralgias and gait problem.  Skin: Negative for color change, pallor, rash and wound.  Neurological: Negative for dizziness, tremors, weakness and light-headedness.  Hematological: Negative for adenopathy. Does not bruise/bleed easily.  Psychiatric/Behavioral: Negative for behavioral problems, confusion, sleep disturbance, dysphoric mood, decreased concentration and agitation.   Sochx: no smoking or alcohol use  Physical Exam   BP 101/67    Pulse (!) 102    Temp 98 F (36.7 C) (Oral)    Wt 164 lb (74.4 kg)    LMP 10/25/2019 (Exact Date)    SpO2 100%    BMI 30.00 kg/m   Physical Exam  Constitutional:  oriented to person, place, and time. appears well-developed and well-nourished. No distress.  HENT: [place]/AT,  PERRLA, no scleral icterus Mouth/Throat: Oropharynx is clear and moist. No oropharyngeal exudate.  Cardiovascular: Normal rate, regular rhythm and normal heart sounds. Exam reveals no  gallop and no friction rub.  No murmur heard.  Pulmonary/Chest: Effort normal and breath sounds normal. No respiratory distress.  has no wheezes.  Neck = supple, no nuchal rigidity Abdominal: Soft. Bowel sounds are normal.  exhibits no distension. There is no tenderness. Gravid+ Lymphadenopathy: no cervical adenopathy. No axillary adenopathy Neurological: alert and oriented to person, place, and time.  Skin: Skin is warm and dry. No rash noted. No erythema.  Psychiatric: a normal mood and affect.  behavior is normal.   Lab Results  Component Value Date   CD4TCELL 32 (L) 02/22/2020   Lab Results  Component Value Date   CD4TABS 364 (L) 02/22/2020   CD4TABS 370 (L) 11/30/2019   CD4TABS 469 10/07/2019   Lab Results  Component Value Date   HIV1RNAQUANT <20 02/22/2020   No results found for: HEPBSAB Lab Results  Component Value Date   LABRPR Non Reactive 05/03/2020    CBC Lab Results  Component Value Date   WBC 7.1 05/03/2020   RBC 4.15 05/03/2020   HGB 13.2 05/03/2020   HCT 38.8 05/03/2020   PLT 139 (L) 05/03/2020   MCV 94 05/03/2020   MCH 31.8 05/03/2020   MCHC 34.0 05/03/2020   RDW 12.6 05/03/2020   LYMPHSABS 1,072 02/22/2020   MONOABS 384 02/12/2017   EOSABS 40 02/22/2020    BMET Lab Results  Component Value Date   NA 136 02/22/2020   K 3.5 02/22/2020   CL 105 02/22/2020   CO2 24 02/22/2020   GLUCOSE 89 02/22/2020   BUN 5 (L) 02/22/2020   CREATININE 0.42 (L) 02/22/2020   CALCIUM 8.8 02/22/2020   GFRNONAA 140 02/22/2020   GFRAA 163 02/22/2020      Assessment and Plan  HIV disease= will check VL to ensure still has undetectable viral load  Long term medication management = cr is stable.   Pregnancy = clarified which bottle to take for prenatal vitamin  Health maintenance = will be able to receive first dose of pfizer vaccine at the office today

## 2020-05-25 NOTE — Progress Notes (Signed)
   Covid-19 Vaccination Clinic  Name:  Mary Zhang    MRN: [id number] DOB: Jul 30, 1990  05/25/2020  Mary Zhang was observed post Covid-19 immunization for 15 minutes without incident. She was provided with Vaccine Information Sheet and instruction to access the V-Safe system.   Mary Zhang was instructed to call 911 with any severe reactions post vaccine: Marland Kitchen Difficulty breathing  . Swelling of face and throat  . A fast heartbeat  . A bad rash all over body  . Dizziness and weakness   Immunizations Administered    Name Date Dose VIS Date Route   Pfizer COVID-19 Vaccine 05/25/2020 12:02 PM 0.3 mL 05/11/2020 Intramuscular   Manufacturer: [employer], [employer]   Lot: [id number]   NDC: [address]-4    Mary Zhang T Pricilla Loveless

## 2020-05-28 LAB — HIV-1 RNA QUANT-NO REFLEX-BLD
HIV 1 RNA Quant: <20 Copies/mL
HIV-1 RNA Quant, Log: <1.3 Log cps/mL

## 2020-06-02 ENCOUNTER — Other Ambulatory Visit: Payer: Self-pay

## 2020-06-02 ENCOUNTER — Ambulatory Visit (INDEPENDENT_AMBULATORY_CARE_PROVIDER_SITE_OTHER): Payer: Medicaid Other | Admitting: Family Medicine

## 2020-06-02 VITALS — BP 86/54 | HR 86 | Wt 162.0 lb

## 2020-06-02 DIAGNOSIS — O09899 Supervision of other high risk pregnancies, unspecified trimester: Secondary | ICD-10-CM

## 2020-06-02 DIAGNOSIS — Z8751 Personal history of pre-term labor: Secondary | ICD-10-CM

## 2020-06-02 DIAGNOSIS — O99119 Other diseases of the blood and blood-forming organs and certain disorders involving the immune mechanism complicating pregnancy, unspecified trimester: Secondary | ICD-10-CM

## 2020-06-02 DIAGNOSIS — Z789 Other specified health status: Secondary | ICD-10-CM

## 2020-06-02 DIAGNOSIS — O98719 Human immunodeficiency virus [HIV] disease complicating pregnancy, unspecified trimester: Secondary | ICD-10-CM

## 2020-06-02 DIAGNOSIS — D696 Thrombocytopenia, unspecified: Secondary | ICD-10-CM

## 2020-06-02 DIAGNOSIS — Z3A31 31 weeks gestation of pregnancy: Secondary | ICD-10-CM

## 2020-06-02 DIAGNOSIS — B20 Human immunodeficiency virus [HIV] disease: Secondary | ICD-10-CM

## 2020-06-02 DIAGNOSIS — O0990 Supervision of high risk pregnancy, unspecified, unspecified trimester: Secondary | ICD-10-CM

## 2020-06-02 NOTE — Progress Notes (Signed)
   PRENATAL VISIT NOTE  Subjective:  Mary Zhang is a 29 y.o. C6C3762 at [redacted]w[redacted]d being seen today for ongoing prenatal care.  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  Patient reports no complaints.  Contractions: Not present. Vag. Bleeding: None.  Movement: Present. Denies leaking of fluid.   The following portions of the patient's history were reviewed and updated as appropriate: allergies, current medications, past family history, past medical history, past social history, past surgical history and problem list.   Objective:   Vitals:   06/02/20 0921  BP: (!) 86/54  Pulse: 86  Weight: 162 lb (73.5 kg)    Fetal Status: Fetal Heart Rate (bpm): 126 Fundal Height: 31 cm Movement: Present     General:  Alert, oriented and cooperative. Patient is in no acute distress.  Skin: Skin is warm and dry. No rash noted.   Cardiovascular: Normal heart rate noted  Respiratory: Normal respiratory effort, no problems with respiration noted  Abdomen: Soft, gravid, appropriate for gestational age.  Pain/Pressure: Absent     Pelvic: Cervical exam deferred        Extremities: Normal range of motion.  Edema: None  Mental Status: Normal mood and affect. Normal behavior. Normal judgment and thought content.   Assessment and Plan:  Pregnancy: G3P1102 at [redacted]w[redacted]d 1. [redacted] weeks gestation of pregnancy  2. Supervision of high risk pregnancy, antepartum FHT and FH normal  3. Maternal HIV positive complicating pregnancy (HCC) VL undetectible  4. History of preterm delivery Decline  17-P  5. Short interval between pregnancies affecting pregnancy, antepartum  6. Language barrier Interpreter used.  7. Thrombocytopenia affecting pregnancy, antepartum (HCC) Recheck CBC at 36 weeks  Preterm labor symptoms and general obstetric precautions including but not limited to vaginal bleeding, contractions, leaking of fluid and fetal movement were reviewed in detail with the patient. Please refer to After Visit Summary for other counseling recommendations.   Return in about 2 weeks (around 06/16/2020) for OB f/u.  Future Appointments  Date Time Provider Department Center  06/06/2020  9:45 AM [facility] NURSE [facility]  06/06/2020 10:00 AM [facility] US1 [facility]  06/14/2020  9:40 AM Aviva Signs, CNM [facility] None  06/24/2020 10:30 AM [facility]  08/22/2020 11:15 AM Judyann Munson, MD [facility]    Levie Heritage, DO

## 2020-06-02 NOTE — Progress Notes (Signed)
Contract interpreter KG.

## 2020-06-06 ENCOUNTER — Encounter: Payer: Self-pay | Admitting: *Deleted

## 2020-06-06 ENCOUNTER — Other Ambulatory Visit: Payer: Self-pay

## 2020-06-06 ENCOUNTER — Ambulatory Visit: Payer: Medicaid Other | Attending: Obstetrics and Gynecology

## 2020-06-06 ENCOUNTER — Ambulatory Visit: Payer: Medicaid Other | Admitting: *Deleted

## 2020-06-06 DIAGNOSIS — O98713 Human immunodeficiency virus [HIV] disease complicating pregnancy, third trimester: Secondary | ICD-10-CM

## 2020-06-06 DIAGNOSIS — O0990 Supervision of high risk pregnancy, unspecified, unspecified trimester: Secondary | ICD-10-CM | POA: Insufficient documentation

## 2020-06-06 DIAGNOSIS — O09899 Supervision of other high risk pregnancies, unspecified trimester: Secondary | ICD-10-CM | POA: Insufficient documentation

## 2020-06-06 DIAGNOSIS — O98719 Human immunodeficiency virus [HIV] disease complicating pregnancy, unspecified trimester: Secondary | ICD-10-CM | POA: Insufficient documentation

## 2020-06-06 DIAGNOSIS — Z363 Encounter for antenatal screening for malformations: Secondary | ICD-10-CM

## 2020-06-06 DIAGNOSIS — Z3A32 32 weeks gestation of pregnancy: Secondary | ICD-10-CM

## 2020-06-06 DIAGNOSIS — O99113 Other diseases of the blood and blood-forming organs and certain disorders involving the immune mechanism complicating pregnancy, third trimester: Secondary | ICD-10-CM | POA: Diagnosis not present

## 2020-06-06 DIAGNOSIS — O09213 Supervision of pregnancy with history of pre-term labor, third trimester: Secondary | ICD-10-CM | POA: Diagnosis not present

## 2020-06-06 DIAGNOSIS — O09293 Supervision of pregnancy with other poor reproductive or obstetric history, third trimester: Secondary | ICD-10-CM

## 2020-06-06 DIAGNOSIS — Z21 Asymptomatic human immunodeficiency virus [HIV] infection status: Secondary | ICD-10-CM

## 2020-06-06 DIAGNOSIS — D696 Thrombocytopenia, unspecified: Secondary | ICD-10-CM

## 2020-06-14 ENCOUNTER — Ambulatory Visit (INDEPENDENT_AMBULATORY_CARE_PROVIDER_SITE_OTHER): Payer: Medicaid Other | Admitting: Advanced Practice Midwife

## 2020-06-14 ENCOUNTER — Encounter: Payer: Self-pay | Admitting: Advanced Practice Midwife

## 2020-06-14 ENCOUNTER — Other Ambulatory Visit: Payer: Self-pay

## 2020-06-14 VITALS — BP 94/60 | HR 91 | Wt 165.0 lb

## 2020-06-14 DIAGNOSIS — O09899 Supervision of other high risk pregnancies, unspecified trimester: Secondary | ICD-10-CM

## 2020-06-14 DIAGNOSIS — O99119 Other diseases of the blood and blood-forming organs and certain disorders involving the immune mechanism complicating pregnancy, unspecified trimester: Secondary | ICD-10-CM

## 2020-06-14 DIAGNOSIS — D696 Thrombocytopenia, unspecified: Secondary | ICD-10-CM

## 2020-06-14 DIAGNOSIS — O0990 Supervision of high risk pregnancy, unspecified, unspecified trimester: Secondary | ICD-10-CM

## 2020-06-14 DIAGNOSIS — Z789 Other specified health status: Secondary | ICD-10-CM

## 2020-06-14 DIAGNOSIS — O98719 Human immunodeficiency virus [HIV] disease complicating pregnancy, unspecified trimester: Secondary | ICD-10-CM

## 2020-06-14 DIAGNOSIS — B20 Human immunodeficiency virus [HIV] disease: Secondary | ICD-10-CM

## 2020-06-14 NOTE — Patient Instructions (Signed)

## 2020-06-14 NOTE — Progress Notes (Signed)
   PRENATAL VISIT NOTE  Subjective:  Mary Zhang is a 29 y.o. G3P1102 at [redacted]w[redacted]d being seen today for ongoing prenatal care.  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  Patient reports no complaints.  Contractions: Not present. Vag. Bleeding: None.  Movement: Present. Denies leaking of fluid.   The following portions of the patient's history were reviewed and updated as appropriate: allergies, current medications, past family history, past medical history, past social history, past surgical history and problem list.   Objective:   Vitals:   06/14/20 0937  BP: 94/60  Pulse: 91  Weight: 165 lb (74.8 kg)    Fetal Status: Fetal Heart Rate (bpm): 133   Movement: Present     General:  Alert, oriented and cooperative. Patient is in no acute distress.  Skin: Skin is warm and dry. No rash noted.   Cardiovascular: Normal heart rate noted  Respiratory: Normal respiratory effort, no problems with respiration noted  Abdomen: Soft, gravid, appropriate for gestational age.  Pain/Pressure: Absent     Pelvic: Cervical exam deferred        Extremities: Normal range of motion.  Edema: None  Mental Status: Normal mood and affect. Normal behavior. Normal judgment and thought content.   Assessment and Plan:  Pregnancy: G3P1102 at [redacted]w[redacted]d 1. Short interval between pregnancies affecting pregnancy, antepartum   2. Supervision of high risk pregnancy, antepartum        3. Maternal HIV positive complicating pregnancy (HCC)     Followed by ID     Did get first Covid vax, scheduled  for second  4. Language barrier Interpretor present  5. Thrombocytopenia affecting pregnancy, antepartum (HCC)     Check at 36 weeks  Preterm labor symptoms and general obstetric precautions including but not limited to vaginal bleeding, contractions, leaking of fluid and fetal movement were reviewed in detail with the patient. Please refer to After Visit Summary for other counseling recommendations.     Future Appointments  Date Time Provider Department Center  06/24/2020 10:30 AM [facility]  06/28/2020 10:30 AM Aviva Signs, CNM [facility] None  08/22/2020 11:15 AM Judyann Munson, MD [facility]    Wynelle Bourgeois, CNM

## 2020-06-24 ENCOUNTER — Ambulatory Visit: Payer: Medicaid Other

## 2020-06-28 ENCOUNTER — Ambulatory Visit (INDEPENDENT_AMBULATORY_CARE_PROVIDER_SITE_OTHER): Payer: Medicaid Other | Admitting: Advanced Practice Midwife

## 2020-06-28 ENCOUNTER — Other Ambulatory Visit: Payer: Self-pay

## 2020-06-28 VITALS — BP 82/52 | HR 90 | Wt 166.0 lb

## 2020-06-28 DIAGNOSIS — B359 Dermatophytosis, unspecified: Secondary | ICD-10-CM | POA: Insufficient documentation

## 2020-06-28 DIAGNOSIS — O99119 Other diseases of the blood and blood-forming organs and certain disorders involving the immune mechanism complicating pregnancy, unspecified trimester: Secondary | ICD-10-CM

## 2020-06-28 DIAGNOSIS — Z789 Other specified health status: Secondary | ICD-10-CM

## 2020-06-28 DIAGNOSIS — D696 Thrombocytopenia, unspecified: Secondary | ICD-10-CM

## 2020-06-28 DIAGNOSIS — Z3A35 35 weeks gestation of pregnancy: Secondary | ICD-10-CM

## 2020-06-28 DIAGNOSIS — O09899 Supervision of other high risk pregnancies, unspecified trimester: Secondary | ICD-10-CM

## 2020-06-28 MED ORDER — CLOTRIMAZOLE-BETAMETHASONE 1-0.05 % EX CREA: 1.0000 "application " | TOPICAL_CREAM | Freq: Two times a day (BID) | CUTANEOUS | 0 refills | Status: DC

## 2020-06-28 MED ORDER — PRENATAL VITAMINS 28-0.8 MG PO TABS: ORAL_TABLET | ORAL | 5 refills | Status: DC

## 2020-06-28 NOTE — Progress Notes (Signed)
[employer] language interpreter Mary Zhang [phone].

## 2020-06-28 NOTE — Progress Notes (Signed)
   PRENATAL VISIT NOTE  Subjective:  Mary Zhang is a 29 y.o. U7O5366 at [redacted]w[redacted]d being seen today for ongoing prenatal care.  She is currently monitored for the following issues for this low-risk pregnancy and has Raised antibody titer; Lead exposure; HIV infection in mother during pregnancy, antepartum; Language barrier; History of preterm premature rupture of membranes; Thrombocytopenia affecting pregnancy, antepartum (HCC); Short interval between pregnancies affecting pregnancy, antepartum; Supervision of high risk pregnancy, antepartum; History of preterm delivery; and Tinea on their problem list.  Patient reports itchy, scaly skin on left middle finger, worse with dishwashing.  Contractions: Not present. Vag. Bleeding: None.  Movement: Present. Denies leaking of fluid.   The following portions of the patient's history were reviewed and updated as appropriate: allergies, current medications, past family history, past medical history, past social history, past surgical history and problem list.   Objective:   Vitals:   06/28/20 1038  BP: (!) 82/52  Pulse: 90  Weight: 166 lb (75.3 kg)    Fetal Status: Fetal Heart Rate (bpm): 122 Fundal Height: 35 cm Movement: Present     General:  Alert, oriented and cooperative. Patient is in no acute distress.  Skin: Skin is warm and dry. Right middle digit with scaly, cracked skin, probably tinea, but could be infection.   Cardiovascular: Normal heart rate noted  Respiratory: Normal respiratory effort, no problems with respiration noted  Abdomen: Soft, gravid, appropriate for gestational age.  Pain/Pressure: Absent     Pelvic: Cervical exam deferred        Extremities: Normal range of motion.  Edema: None  Mental Status: Normal mood and affect. Normal behavior. Normal judgment and thought content.     Assessment and Plan:  Pregnancy: G3P1102 at [redacted]w[redacted]d 1. [redacted] weeks gestation of pregnancy     Will check cultures next week - Prenatal Vit-Fe  Fumarate-FA (PRENATAL VITAMINS) 28-0.8 MG TABS; Take one tablet by mouth daily.  Dispense: 30 tablet; Refill: 5  2. Tinea     Rx Clotrimazole cream     Recheck next week. If not better, may refer or change therapy  3. Short interval between pregnancies affecting pregnancy, antepartum       4. Language barrier     Video interpretor used 5.  Gest Thrombocytopenia     Recheck platelets next week  Preterm labor symptoms and general obstetric precautions including but not limited to vaginal bleeding, contractions, leaking of fluid and fetal movement were reviewed in detail with the patient. Please refer to After Visit Summary for other counseling recommendations.     Future Appointments  Date Time Provider Department Center  07/01/2020 10:15 AM [facility]  07/05/2020 10:10 AM Aviva Signs, CNM [facility] None  08/22/2020 11:15 AM Judyann Munson, MD [facility]    Wynelle Bourgeois, CNM

## 2020-07-01 ENCOUNTER — Other Ambulatory Visit: Payer: Self-pay

## 2020-07-01 ENCOUNTER — Ambulatory Visit (INDEPENDENT_AMBULATORY_CARE_PROVIDER_SITE_OTHER): Payer: Medicaid Other

## 2020-07-01 DIAGNOSIS — Z23 Encounter for immunization: Secondary | ICD-10-CM | POA: Diagnosis not present

## 2020-07-01 NOTE — Progress Notes (Signed)
   Covid-19 Vaccination Clinic  Name:  Mary Zhang    MRN: [id number] DOB: 11/06/1990  07/01/2020  [name] was observed post Covid-19 immunization for 15 minutes without incident. She was provided with Vaccine Information Sheet and instruction to access the V-Safe system.   [name] was instructed to call 911 with any severe reactions post vaccine: Marland Kitchen Difficulty breathing  . Swelling of face and throat  . A fast heartbeat  . A bad rash all over body  . Dizziness and weakness   Immunizations Administered    Name Date Dose VIS Date Route   Pfizer COVID-19 Vaccine 07/01/2020 10:31 AM 0.3 mL 05/11/2020 Intramuscular   Manufacturer: [employer], [employer]   Lot: [id number]   NDC: [address]-8     Rosanna Randy, RN

## 2020-07-05 ENCOUNTER — Other Ambulatory Visit (HOSPITAL_COMMUNITY)
Admission: RE | Admit: 2020-07-05 | Discharge: 2020-07-05 | Disposition: A | Discharge status: Home / Self Care | Payer: Medicaid Other | Source: Ambulatory Visit | Attending: Advanced Practice Midwife | Admitting: Advanced Practice Midwife

## 2020-07-05 ENCOUNTER — Encounter: Payer: Self-pay | Admitting: Advanced Practice Midwife

## 2020-07-05 ENCOUNTER — Other Ambulatory Visit: Payer: Self-pay

## 2020-07-05 ENCOUNTER — Ambulatory Visit (INDEPENDENT_AMBULATORY_CARE_PROVIDER_SITE_OTHER): Payer: Medicaid Other | Admitting: Advanced Practice Midwife

## 2020-07-05 VITALS — BP 82/52 | HR 80 | Wt 167.0 lb

## 2020-07-05 DIAGNOSIS — O0993 Supervision of high risk pregnancy, unspecified, third trimester: Secondary | ICD-10-CM | POA: Diagnosis present

## 2020-07-05 DIAGNOSIS — O09899 Supervision of other high risk pregnancies, unspecified trimester: Secondary | ICD-10-CM

## 2020-07-05 DIAGNOSIS — O0990 Supervision of high risk pregnancy, unspecified, unspecified trimester: Secondary | ICD-10-CM | POA: Diagnosis not present

## 2020-07-05 DIAGNOSIS — Z21 Asymptomatic human immunodeficiency virus [HIV] infection status: Secondary | ICD-10-CM | POA: Diagnosis not present

## 2020-07-05 DIAGNOSIS — O99119 Other diseases of the blood and blood-forming organs and certain disorders involving the immune mechanism complicating pregnancy, unspecified trimester: Secondary | ICD-10-CM | POA: Diagnosis not present

## 2020-07-05 DIAGNOSIS — B359 Dermatophytosis, unspecified: Secondary | ICD-10-CM | POA: Diagnosis not present

## 2020-07-05 DIAGNOSIS — O09213 Supervision of pregnancy with history of pre-term labor, third trimester: Secondary | ICD-10-CM | POA: Diagnosis not present

## 2020-07-05 DIAGNOSIS — Z3A36 36 weeks gestation of pregnancy: Secondary | ICD-10-CM | POA: Diagnosis not present

## 2020-07-05 DIAGNOSIS — D696 Thrombocytopenia, unspecified: Secondary | ICD-10-CM | POA: Diagnosis not present

## 2020-07-05 DIAGNOSIS — O98713 Human immunodeficiency virus [HIV] disease complicating pregnancy, third trimester: Secondary | ICD-10-CM | POA: Insufficient documentation

## 2020-07-05 DIAGNOSIS — O98719 Human immunodeficiency virus [HIV] disease complicating pregnancy, unspecified trimester: Secondary | ICD-10-CM

## 2020-07-05 NOTE — Patient Instructions (Signed)

## 2020-07-05 NOTE — Progress Notes (Signed)
[employer] language services interpreter [place] [phone].  Fordyce Lepak l Thanvi Blincoe, CMA

## 2020-07-05 NOTE — Progress Notes (Signed)
   PRENATAL VISIT NOTE  Subjective:  Mary Zhang is a 29 y.o. O1Y2482 at [redacted]w[redacted]d being seen today for ongoing prenatal care.  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  Patient reports no complaints and Finger is better after treatment with cream.  Contractions: Not present. Vag. Bleeding: None.  Movement: Present. Denies leaking of fluid.   The following portions of the patient's history were reviewed and updated as appropriate: allergies, current medications, past family history, past medical history, past social history, past surgical history and problem list.   Objective:   Vitals:   07/05/20 1023  BP: (!) 82/52  Pulse: 80  Weight: 167 lb (75.8 kg)    Fetal Status: Fetal Heart Rate (bpm): 128 Fundal Height: 36 cm Movement: Present     General:  Alert, oriented and cooperative. Patient is in no acute distress.  Skin: Skin is warm and dry. No rash noted.   Cardiovascular: Normal heart rate noted  Respiratory: Normal respiratory effort, no problems with respiration noted  Abdomen: Soft, gravid, appropriate for gestational age.  Pain/Pressure: Present     Pelvic: Cervical exam performed in the presence of a chaperone       Cervix 1+/60%/soft/-3/vertex   Extremities: Normal range of motion.  Edema: None  Mental Status: Normal mood and affect. Normal behavior. Normal judgment and thought content.   Assessment and Plan:  Pregnancy: G3P1102 at [redacted]w[redacted]d 1. Tinea      Much improved  2. Short interval between pregnancies affecting pregnancy, antepartum   3.  Supervision of high risk pregnancy, antepartum  - Culture, beta strep (group b only) - GC/Chlamydia probe amp ([place])not at [facility] - CBC  4. [redacted] weeks gestation of pregnancy  - Culture, beta strep (group b only) - GC/Chlamydia probe amp ([place])not at [facility] - CBC  5. Benign gestational thrombocytopenia, antepartum (HCC)      CBC today  6. 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 Please refer to After Visit Summary for other counseling recommendations.   No follow-ups on file.  Future Appointments  Date Time Provider Department Center  07/12/2020 10:10 AM Aviva Signs, CNM [facility] None  07/20/2020 11:15 AM Levie Heritage, DO [facility] None  08/22/2020 11:15 AM Judyann Munson, MD [facility]    Wynelle Bourgeois, CNM

## 2020-07-06 LAB — GC/CHLAMYDIA PROBE AMP (~~LOC~~) NOT AT ARMC
Chlamydia: NEGATIVE
Comment: NEGATIVE
Comment: NORMAL
Neisseria Gonorrhea: NEGATIVE

## 2020-07-09 LAB — CULTURE, BETA STREP (GROUP B ONLY): Strep Gp B Culture: NEGATIVE

## 2020-07-12 ENCOUNTER — Other Ambulatory Visit: Payer: Self-pay

## 2020-07-12 ENCOUNTER — Ambulatory Visit (INDEPENDENT_AMBULATORY_CARE_PROVIDER_SITE_OTHER): Payer: Medicaid Other | Admitting: Advanced Practice Midwife

## 2020-07-12 ENCOUNTER — Encounter: Payer: Self-pay | Admitting: Advanced Practice Midwife

## 2020-07-12 ENCOUNTER — Telehealth (HOSPITAL_COMMUNITY): Payer: Self-pay | Admitting: *Deleted

## 2020-07-12 VITALS — BP 88/56 | HR 77 | Wt 170.0 lb

## 2020-07-12 DIAGNOSIS — D696 Thrombocytopenia, unspecified: Secondary | ICD-10-CM

## 2020-07-12 DIAGNOSIS — O99119 Other diseases of the blood and blood-forming organs and certain disorders involving the immune mechanism complicating pregnancy, unspecified trimester: Secondary | ICD-10-CM

## 2020-07-12 DIAGNOSIS — B20 Human immunodeficiency virus [HIV] disease: Secondary | ICD-10-CM

## 2020-07-12 DIAGNOSIS — O09899 Supervision of other high risk pregnancies, unspecified trimester: Secondary | ICD-10-CM

## 2020-07-12 DIAGNOSIS — O98719 Human immunodeficiency virus [HIV] disease complicating pregnancy, unspecified trimester: Secondary | ICD-10-CM

## 2020-07-12 NOTE — Patient Instructions (Signed)
Labor and Vaginal Delivery  Vaginal delivery means that you give birth by pushing your baby out of your birth canal (vagina). A team of health care providers will help you before, during, and after vaginal delivery. Birth experiences are unique for every woman and every pregnancy, and birth experiences vary depending on where you choose to give birth. What happens when I arrive at the birth center or hospital? Once you are in labor and have been admitted into the hospital or birth center, your health care provider may:  Review your pregnancy history and any concerns that you have.  Insert an IV into one of your veins. This may be used to give you fluids and medicines.  Check your blood pressure, pulse, temperature, and heart rate (vital signs).  Check whether your bag of water (amniotic sac) has broken (ruptured).  Talk with you about your birth plan and discuss pain control options. Monitoring Your health care provider may monitor your contractions (uterine monitoring) and your baby's heart rate (fetal monitoring). You may need to be monitored:  Often, but not continuously (intermittently).  All the time or for long periods at a time (continuously). Continuous monitoring may be needed if: ? You are taking certain medicines, such as medicine to relieve pain or make your contractions stronger. ? You have pregnancy or labor complications. Monitoring may be done by:  Placing a special stethoscope or a handheld monitoring device on your abdomen to check your baby's heartbeat and to check for contractions.  Placing monitors on your abdomen (external monitors) to record your baby's heartbeat and the frequency and length of contractions.  Placing monitors inside your uterus through your vagina (internal monitors) to record your baby's heartbeat and the frequency, length, and strength of your contractions. Depending on the type of monitor, it may remain in your uterus or on your baby's head  until birth.  Telemetry. This is a type of continuous monitoring that can be done with external or internal monitors. Instead of having to stay in bed, you are able to move around during telemetry. Physical exam Your health care provider may perform frequent physical exams. This may include:  Checking how and where your baby is positioned in your uterus.  Checking your cervix to determine: ? Whether it is thinning out (effacing). ? Whether it is opening up (dilating). What happens during labor and delivery?  Normal labor and delivery is divided into the following three stages: Stage 1  This is the longest stage of labor.  This stage can last for hours or days.  Throughout this stage, you will feel contractions. Contractions generally feel mild, infrequent, and irregular at first. They get stronger, more frequent (about every 2-3 minutes), and more regular as you move through this stage.  This stage ends when your cervix is completely dilated to 4 inches (10 cm) and completely effaced. Stage 2  This stage starts once your cervix is completely effaced and dilated and lasts until the delivery of your baby.  This stage may last from 20 minutes to 2 hours.  This is the stage where you will feel an urge to push your baby out of your vagina.  You may feel stretching and burning pain, especially when the widest part of your baby's head passes through the vaginal opening (crowning).  Once your baby is delivered, the umbilical cord will be clamped and cut. This usually occurs after waiting a period of 1-2 minutes after delivery.  Your baby will be placed on your   bare chest (skin-to-skin contact) in an upright position and covered with a warm blanket. Watch your baby for feeding cues, like rooting or sucking, and help the baby to your breast for his or her first feeding. Stage 3  This stage starts immediately after the birth of your baby and ends after you deliver the placenta.  This  stage may take anywhere from 5 to 30 minutes.  After your baby has been delivered, you will feel contractions as your body expels the placenta and your uterus contracts to control bleeding. What can I expect after labor and delivery?  After labor is over, you and your baby will be monitored closely until you are ready to go home to ensure that you are both healthy. Your health care team will teach you how to care for yourself and your baby.  You and your baby will stay in the same room (rooming in) during your hospital stay. This will encourage early bonding and successful breastfeeding.  You may continue to receive fluids and medicines through an IV.  Your uterus will be checked and massaged regularly (fundal massage).  You will have some soreness and pain in your abdomen, vagina, and the area of skin between your vaginal opening and your anus (perineum).  If an incision was made near your vagina (episiotomy) or if you had some vaginal tearing during delivery, cold compresses may be placed on your episiotomy or your tear. This helps to reduce pain and swelling.  You may be given a squirt bottle to use instead of wiping when you go to the bathroom. To use the squirt bottle, follow these steps: ? Before you urinate, fill the squirt bottle with warm water. Do not use hot water. ? After you urinate, while you are sitting on the toilet, use the squirt bottle to rinse the area around your urethra and vaginal opening. This rinses away any urine and blood. ? Fill the squirt bottle with clean water every time you use the bathroom.  It is normal to have vaginal bleeding after delivery. Wear a sanitary pad for vaginal bleeding and discharge. Summary  Vaginal delivery means that you will give birth by pushing your baby out of your birth canal (vagina).  Your health care provider may monitor your contractions (uterine monitoring) and your baby's heart rate (fetal monitoring).  Your health care  provider may perform a physical exam.  Normal labor and delivery is divided into three stages.  After labor is over, you and your baby will be monitored closely until you are ready to go home. This information is not intended to replace advice given to you by your health care provider. Make sure you discuss any questions you have with your health care provider. Document Revised: 08/13/2017 Document Reviewed: 08/13/2017 Elsevier Patient Education  2020 Elsevier Inc.  

## 2020-07-12 NOTE — Progress Notes (Signed)
[employer] language services interpreter [place] [phone].

## 2020-07-12 NOTE — Telephone Encounter (Signed)
Preadmission screen Interpreter number [phone]

## 2020-07-12 NOTE — Progress Notes (Signed)
   PRENATAL VISIT NOTE  Subjective:  Mary Zhang is a 29 y.o. G3P1102 at [redacted]w[redacted]d being seen today for ongoing prenatal care.  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  Patient reports occasional contractions and pelvic pressure.  Contractions: Irregular. Vag. Bleeding: None.  Movement: Present. Denies leaking of fluid.   The following portions of the patient's history were reviewed and updated as appropriate: allergies, current medications, past family history, past medical history, past social history, past surgical history and problem list.   Objective:   Vitals:   07/12/20 1024  BP: (!) 88/56  Pulse: 77  Weight: 170 lb (77.1 kg)    Fetal Status: Fetal Heart Rate (bpm): 121 Fundal Height: 39 cm Movement: Present  Presentation: Vertex  General:  Alert, oriented and cooperative. Patient is in no acute distress.  Skin: Skin is warm and dry. No rash noted.   Cardiovascular: Normal heart rate noted  Respiratory: Normal respiratory effort, no problems with respiration noted  Abdomen: Soft, gravid, appropriate for gestational age.  Pain/Pressure: Present     Pelvic: Cervical exam performed in the presence of a chaperone Dilation: 2.5 Effacement (%): 70 Station: -2  Extremities: Normal range of motion.  Edema: None  Mental Status: Normal mood and affect. Normal behavior. Normal judgment and thought content.   Assessment and Plan:  Pregnancy: G3P1102 at [redacted]w[redacted]d 1. Thrombocytopenia affecting pregnancy, antepartum (HCC)      Check platelets - CBC  2.    Contractions      UCs not frequent      Cervix favorable      IOL scheduled for 39 wks Orders placed.  Probably use Pitocin.   Term labor symptoms and general obstetric precautions including but not limited to vaginal bleeding, contractions, leaking of fluid and fetal movement were reviewed in detail with the patient. Please refer to After Visit Summary for other counseling recommendations.   Return in about 1 week (around 07/19/2020) for [facility].  Future Appointments  Date Time Provider Department Center  07/20/2020 11:15 AM Mary Heritage, DO [facility] None  07/24/2020  8:25 AM [facility] SCHED ROOM [facility] None  08/22/2020 11:15 AM Mary Munson, MD [facility]    Mary Zhang, CNM

## 2020-07-13 ENCOUNTER — Telehealth (HOSPITAL_COMMUNITY): Payer: Self-pay | Admitting: *Deleted

## 2020-07-13 LAB — CBC
Hematocrit: 40.7 % (ref 34.0–46.6)
Hemoglobin: 14 g/dL (ref 11.1–15.9)
MCH: 32.3 pg (ref 26.6–33.0)
MCHC: 34.4 g/dL (ref 31.5–35.7)
MCV: 94 fL (ref 79–97)
Platelets: 110 10*3/uL — ABNORMAL LOW (ref 150–450)
RBC: 4.34 x10E6/uL (ref 3.77–5.28)
RDW: 14.1 % (ref 11.7–15.4)
WBC: 7 10*3/uL (ref 3.4–10.8)

## 2020-07-13 NOTE — Telephone Encounter (Signed)
Preadmission screen Interpreter number [phone]

## 2020-07-14 ENCOUNTER — Telehealth (HOSPITAL_COMMUNITY): Payer: Self-pay | Admitting: *Deleted

## 2020-07-14 ENCOUNTER — Encounter (HOSPITAL_COMMUNITY): Payer: Self-pay | Admitting: *Deleted

## 2020-07-14 NOTE — Telephone Encounter (Signed)
Preadmission screen Interpreter number [phone] 

## 2020-07-15 ENCOUNTER — Encounter (HOSPITAL_COMMUNITY): Payer: Self-pay | Admitting: Obstetrics & Gynecology

## 2020-07-15 ENCOUNTER — Other Ambulatory Visit: Payer: Self-pay

## 2020-07-15 ENCOUNTER — Inpatient Hospital Stay (HOSPITAL_COMMUNITY)
Admission: AD | Admit: 2020-07-15 | Discharge: 2020-07-15 | Disposition: A | Discharge status: Home / Self Care | Payer: Medicaid Other | Attending: Obstetrics & Gynecology | Admitting: Obstetrics & Gynecology

## 2020-07-15 DIAGNOSIS — O09899 Supervision of other high risk pregnancies, unspecified trimester: Secondary | ICD-10-CM | POA: Insufficient documentation

## 2020-07-15 DIAGNOSIS — O0990 Supervision of high risk pregnancy, unspecified, unspecified trimester: Secondary | ICD-10-CM | POA: Diagnosis not present

## 2020-07-15 DIAGNOSIS — O479 False labor, unspecified: Secondary | ICD-10-CM | POA: Insufficient documentation

## 2020-07-15 DIAGNOSIS — B359 Dermatophytosis, unspecified: Secondary | ICD-10-CM | POA: Insufficient documentation

## 2020-07-15 DIAGNOSIS — O99719 Diseases of the skin and subcutaneous tissue complicating pregnancy, unspecified trimester: Secondary | ICD-10-CM | POA: Insufficient documentation

## 2020-07-15 NOTE — Progress Notes (Signed)
Patient reports to triage with complaints of abdominal contractions that radiate to her back. Patient reports feeling contractions since midnight. Patient denies any leaking of fluid or bleeding. Patient reports fetal movements. Triage assessment completed. Provider notified. Orders given for discharge after NST.

## 2020-07-15 NOTE — MAU Note (Signed)
Ctxs since 66mn. Denies VB or LOF. 3cm on Tues.

## 2020-07-15 NOTE — Progress Notes (Signed)
Interpreter [phone]

## 2020-07-18 ENCOUNTER — Other Ambulatory Visit (HOSPITAL_COMMUNITY): Payer: Self-pay | Admitting: Advanced Practice Midwife

## 2020-07-20 ENCOUNTER — Ambulatory Visit (INDEPENDENT_AMBULATORY_CARE_PROVIDER_SITE_OTHER): Payer: Medicaid Other | Admitting: Family Medicine

## 2020-07-20 ENCOUNTER — Other Ambulatory Visit: Payer: Self-pay

## 2020-07-20 VITALS — BP 93/63 | HR 91 | Wt 169.0 lb

## 2020-07-20 DIAGNOSIS — Z8751 Personal history of pre-term labor: Secondary | ICD-10-CM

## 2020-07-20 DIAGNOSIS — O0990 Supervision of high risk pregnancy, unspecified, unspecified trimester: Secondary | ICD-10-CM

## 2020-07-20 DIAGNOSIS — O99119 Other diseases of the blood and blood-forming organs and certain disorders involving the immune mechanism complicating pregnancy, unspecified trimester: Secondary | ICD-10-CM

## 2020-07-20 DIAGNOSIS — Z3A38 38 weeks gestation of pregnancy: Secondary | ICD-10-CM

## 2020-07-20 DIAGNOSIS — Z789 Other specified health status: Secondary | ICD-10-CM

## 2020-07-20 DIAGNOSIS — D696 Thrombocytopenia, unspecified: Secondary | ICD-10-CM

## 2020-07-20 DIAGNOSIS — B20 Human immunodeficiency virus [HIV] disease: Secondary | ICD-10-CM

## 2020-07-20 DIAGNOSIS — O09899 Supervision of other high risk pregnancies, unspecified trimester: Secondary | ICD-10-CM

## 2020-07-20 NOTE — Progress Notes (Signed)
Interpreter [phone]

## 2020-07-20 NOTE — Progress Notes (Signed)
   PRENATAL VISIT NOTE  Subjective:  Mary Zhang is a 29 y.o. E2A8341 at [redacted]w[redacted]d being seen today for ongoing prenatal care.  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  Patient reports no complaints.  Contractions: Irritability. Vag. Bleeding: None.  Movement: Present. Denies leaking of fluid.   The following portions of the patient's history were reviewed and updated as appropriate: allergies, current medications, past family history, past medical history, past social history, past surgical history and problem list.   Objective:   Vitals:   07/20/20 1107  BP: 93/63  Pulse: 91  Weight: 169 lb (76.7 kg)    Fetal Status: Fetal Heart Rate (bpm): 125 Fundal Height: 40 cm Movement: Present  Presentation: Vertex  General:  Alert, oriented and cooperative. Patient is in no acute distress.  Skin: Skin is warm and dry. No rash noted.   Cardiovascular: Normal heart rate noted  Respiratory: Normal respiratory effort, no problems with respiration noted  Abdomen: Soft, gravid, appropriate for gestational age.  Pain/Pressure: Present     Pelvic: Cervical exam performed in the presence of a chaperone Dilation: 4 Effacement (%): 70 Station: -2  Extremities: Normal range of motion.  Edema: None  Mental Status: Normal mood and affect. Normal behavior. Normal judgment and thought content.   Assessment and Plan:  Pregnancy: G3P1102 at [redacted]w[redacted]d 1. [redacted] weeks gestation of pregnancy  2. Supervision of high risk pregnancy, antepartum FHT and FH normal  3. HIV disease  (HCC) Controlled - Viral load undetectable Induce at 39 weeks - AZT prior to delivery  4. Short interval between pregnancies affecting pregnancy, antepartum  5. Language barrier Interpreter used  6. History of preterm delivery Term now  7. Thrombocytopenia affecting pregnancy, antepartum (HCC) Platelets 110 on 12/21  Term labor symptoms and general obstetric precautions including but not limited to vaginal bleeding, contractions, leaking of fluid and fetal movement were reviewed in detail with the patient. Please refer to After Visit Summary for other counseling recommendations.   No follow-ups on file.  Future Appointments  Date Time Provider Department Center  07/24/2020  8:25 AM [facility] SCHED ROOM [facility] None  08/22/2020 11:15 AM Judyann Munson, MD [facility]  08/26/2020 10:30 AM Willodean Rosenthal, MD [facility] None    Levie Heritage, DO

## 2020-07-23 ENCOUNTER — Other Ambulatory Visit: Payer: Self-pay | Admitting: Advanced Practice Midwife

## 2020-07-23 NOTE — L&D Delivery Note (Signed)
Delivery Note Pt SROM'd at 1823 and started having strong frequent UC's. Involuntary urge to push soon after.  At 6:47 PM a viable and healthy female was delivered via Vaginal, Spontaneous (Presentation: Right Occiput Anterior).  APGAR: 9, 9; weight 7 lb 14.5 oz (3585 g).   Placenta status: Pathology;Spontaneous, Intact.  Cord: 3 vessels with the following complications: None.  Cord pH: NA  Anesthesia: None Episiotomy: None Lacerations: None Suture Repair: NA Est. Blood Loss (mL): 100  Mom to postpartum.  Baby to Couplet care / Skin to Skin. Placenta to: Pathology Feeding: Bottle Circ: No Contraception: Undecided  HIV: Continue Truimeq per ID.  Dorathy Kinsman 07/24/2020, 9:01 PM

## 2020-07-24 ENCOUNTER — Other Ambulatory Visit: Payer: Self-pay

## 2020-07-24 ENCOUNTER — Encounter (HOSPITAL_COMMUNITY): Payer: Self-pay | Admitting: Family Medicine

## 2020-07-24 ENCOUNTER — Inpatient Hospital Stay (HOSPITAL_COMMUNITY)
Admission: AD | Admit: 2020-07-24 | Discharge: 2020-07-27 | DRG: 806 | Disposition: A | Discharge status: Home / Self Care | Payer: Medicaid Other | Attending: Obstetrics and Gynecology | Admitting: Obstetrics and Gynecology

## 2020-07-24 ENCOUNTER — Inpatient Hospital Stay (HOSPITAL_COMMUNITY): Payer: Medicaid Other

## 2020-07-24 DIAGNOSIS — Z8751 Personal history of pre-term labor: Secondary | ICD-10-CM

## 2020-07-24 DIAGNOSIS — O9912 Other diseases of the blood and blood-forming organs and certain disorders involving the immune mechanism complicating childbirth: Secondary | ICD-10-CM | POA: Diagnosis present

## 2020-07-24 DIAGNOSIS — O09899 Supervision of other high risk pregnancies, unspecified trimester: Secondary | ICD-10-CM

## 2020-07-24 DIAGNOSIS — Z21 Asymptomatic human immunodeficiency virus [HIV] infection status: Secondary | ICD-10-CM | POA: Diagnosis present

## 2020-07-24 DIAGNOSIS — Z20822 Contact with and (suspected) exposure to covid-19: Secondary | ICD-10-CM | POA: Diagnosis present

## 2020-07-24 DIAGNOSIS — K567 Ileus, unspecified: Secondary | ICD-10-CM | POA: Diagnosis not present

## 2020-07-24 DIAGNOSIS — O99119 Other diseases of the blood and blood-forming organs and certain disorders involving the immune mechanism complicating pregnancy, unspecified trimester: Secondary | ICD-10-CM | POA: Diagnosis present

## 2020-07-24 DIAGNOSIS — D6959 Other secondary thrombocytopenia: Secondary | ICD-10-CM | POA: Diagnosis present

## 2020-07-24 DIAGNOSIS — O9872 Human immunodeficiency virus [HIV] disease complicating childbirth: Principal | ICD-10-CM | POA: Diagnosis present

## 2020-07-24 DIAGNOSIS — Z3A39 39 weeks gestation of pregnancy: Secondary | ICD-10-CM

## 2020-07-24 DIAGNOSIS — Z789 Other specified health status: Secondary | ICD-10-CM | POA: Diagnosis present

## 2020-07-24 DIAGNOSIS — O9963 Diseases of the digestive system complicating the puerperium: Secondary | ICD-10-CM | POA: Diagnosis not present

## 2020-07-24 DIAGNOSIS — Z77011 Contact with and (suspected) exposure to lead: Secondary | ICD-10-CM | POA: Diagnosis present

## 2020-07-24 DIAGNOSIS — O4202 Full-term premature rupture of membranes, onset of labor within 24 hours of rupture: Secondary | ICD-10-CM | POA: Diagnosis not present

## 2020-07-24 DIAGNOSIS — D696 Thrombocytopenia, unspecified: Secondary | ICD-10-CM | POA: Diagnosis present

## 2020-07-24 DIAGNOSIS — K9189 Other postprocedural complications and disorders of digestive system: Secondary | ICD-10-CM | POA: Diagnosis not present

## 2020-07-24 DIAGNOSIS — R109 Unspecified abdominal pain: Secondary | ICD-10-CM

## 2020-07-24 DIAGNOSIS — R760 Raised antibody titer: Secondary | ICD-10-CM | POA: Diagnosis present

## 2020-07-24 DIAGNOSIS — O98719 Human immunodeficiency virus [HIV] disease complicating pregnancy, unspecified trimester: Secondary | ICD-10-CM | POA: Diagnosis present

## 2020-07-24 LAB — CBC
HCT: 38.6 % (ref 36.0–46.0)
Hemoglobin: 13.6 g/dL (ref 12.0–15.0)
MCH: 32.8 pg (ref 26.0–34.0)
MCHC: 35.2 g/dL (ref 30.0–36.0)
MCV: 93 fL (ref 80.0–100.0)
Platelets: 129 10*3/uL — ABNORMAL LOW (ref 150–400)
RBC: 4.15 MIL/uL (ref 3.87–5.11)
RDW: 13.3 % (ref 11.5–15.5)
WBC: 8.2 10*3/uL (ref 4.0–10.5)
nRBC: 0 % (ref 0.0–0.2)

## 2020-07-24 LAB — TYPE AND SCREEN
ABO/RH(D): A POS
Antibody Screen: NEGATIVE

## 2020-07-24 LAB — RESP PANEL BY RT-PCR (FLU A&B, COVID) ARPGX2
Influenza A by PCR: NEGATIVE
Influenza B by PCR: NEGATIVE
SARS Coronavirus 2 by RT PCR: NEGATIVE

## 2020-07-24 MED ORDER — PHENYLEPHRINE 40 MCG/ML (10ML) SYRINGE FOR IV PUSH (FOR BLOOD PRESSURE SUPPORT): 80.0000 ug | PREFILLED_SYRINGE | INTRAVENOUS | Status: DC | PRN

## 2020-07-24 MED ORDER — ZIDOVUDINE 10 MG/ML IV SOLN
1.0000 mg/kg/h | INTRAVENOUS | Status: DC
  Administered 2020-07-24: 1 mg/kg/h via INTRAVENOUS
  Filled 2020-07-24 (×3): qty 40

## 2020-07-24 MED ORDER — PRENATAL MULTIVITAMIN CH
1.0000 | ORAL_TABLET | Freq: Every day | ORAL | Status: DC
  Administered 2020-07-25: 1 via ORAL
  Filled 2020-07-24: qty 1

## 2020-07-24 MED ORDER — LACTATED RINGERS IV SOLN
500.0000 mL | INTRAVENOUS | Status: DC | PRN
  Administered 2020-07-24: 500 mL via INTRAVENOUS

## 2020-07-24 MED ORDER — COCONUT OIL OIL: 1.0000 "application " | TOPICAL_OIL | Status: DC | PRN

## 2020-07-24 MED ORDER — ACETAMINOPHEN 325 MG PO TABS
650.0000 mg | ORAL_TABLET | ORAL | Status: DC | PRN
  Administered 2020-07-24 – 2020-07-25 (×3): 650 mg via ORAL
  Filled 2020-07-24 (×3): qty 2

## 2020-07-24 MED ORDER — OXYTOCIN BOLUS FROM INFUSION
333.0000 mL | Freq: Once | INTRAVENOUS | Status: AC
  Administered 2020-07-24: 333 mL via INTRAVENOUS

## 2020-07-24 MED ORDER — ZOLPIDEM TARTRATE 5 MG PO TABS: 5.0000 mg | ORAL_TABLET | Freq: Every evening | ORAL | Status: DC | PRN

## 2020-07-24 MED ORDER — MEASLES, MUMPS & RUBELLA VAC IJ SOLR: 0.5000 mL | Freq: Once | INTRAMUSCULAR | Status: DC

## 2020-07-24 MED ORDER — OXYCODONE-ACETAMINOPHEN 5-325 MG PO TABS
1.0000 | ORAL_TABLET | ORAL | Status: DC | PRN
  Filled 2020-07-24: qty 1

## 2020-07-24 MED ORDER — OXYTOCIN-SODIUM CHLORIDE 30-0.9 UT/500ML-% IV SOLN
2.5000 [IU]/h | INTRAVENOUS | Status: DC
  Filled 2020-07-24: qty 500

## 2020-07-24 MED ORDER — DIBUCAINE (PERIANAL) 1 % EX OINT: 1.0000 "application " | TOPICAL_OINTMENT | CUTANEOUS | Status: DC | PRN

## 2020-07-24 MED ORDER — OXYCODONE-ACETAMINOPHEN 5-325 MG PO TABS: 2.0000 | ORAL_TABLET | ORAL | Status: DC | PRN

## 2020-07-24 MED ORDER — FERROUS SULFATE 325 (65 FE) MG PO TABS
325.0000 mg | ORAL_TABLET | Freq: Two times a day (BID) | ORAL | Status: DC
  Administered 2020-07-25 (×2): 325 mg via ORAL
  Filled 2020-07-24 (×2): qty 1

## 2020-07-24 MED ORDER — LACTATED RINGERS IV SOLN: INTRAVENOUS | Status: DC

## 2020-07-24 MED ORDER — OXYCODONE-ACETAMINOPHEN 5-325 MG PO TABS: 1.0000 | ORAL_TABLET | ORAL | Status: DC | PRN

## 2020-07-24 MED ORDER — WITCH HAZEL-GLYCERIN EX PADS: 1.0000 "application " | MEDICATED_PAD | CUTANEOUS | Status: DC | PRN

## 2020-07-24 MED ORDER — ONDANSETRON HCL 4 MG/2ML IJ SOLN: 4.0000 mg | Freq: Four times a day (QID) | INTRAMUSCULAR | Status: DC | PRN

## 2020-07-24 MED ORDER — BENZOCAINE-MENTHOL 20-0.5 % EX AERO
1.0000 "application " | INHALATION_SPRAY | CUTANEOUS | Status: DC | PRN
  Administered 2020-07-24: 1 via TOPICAL
  Filled 2020-07-24: qty 56

## 2020-07-24 MED ORDER — FENTANYL-BUPIVACAINE-NACL 0.5-0.125-0.9 MG/250ML-% EP SOLN
12.0000 mL/h | EPIDURAL | Status: DC | PRN
Start: 2020-07-24 — End: 2020-07-24

## 2020-07-24 MED ORDER — EPHEDRINE 5 MG/ML INJ: 10.0000 mg | INTRAVENOUS | Status: DC | PRN

## 2020-07-24 MED ORDER — IBUPROFEN 600 MG PO TABS
600.0000 mg | ORAL_TABLET | Freq: Four times a day (QID) | ORAL | Status: DC
  Administered 2020-07-24 – 2020-07-25 (×5): 600 mg via ORAL
  Filled 2020-07-24 (×6): qty 1

## 2020-07-24 MED ORDER — LACTATED RINGERS IV SOLN: 500.0000 mL | Freq: Once | INTRAVENOUS | Status: DC

## 2020-07-24 MED ORDER — LIDOCAINE HCL (PF) 1 % IJ SOLN: 30.0000 mL | INTRAMUSCULAR | Status: DC | PRN

## 2020-07-24 MED ORDER — MAGNESIUM HYDROXIDE 400 MG/5ML PO SUSP: 30.0000 mL | ORAL | Status: DC | PRN

## 2020-07-24 MED ORDER — DIPHENHYDRAMINE HCL 25 MG PO CAPS: 25.0000 mg | ORAL_CAPSULE | Freq: Four times a day (QID) | ORAL | Status: DC | PRN

## 2020-07-24 MED ORDER — SIMETHICONE 80 MG PO CHEW: 80.0000 mg | CHEWABLE_TABLET | ORAL | Status: DC | PRN

## 2020-07-24 MED ORDER — SOD CITRATE-CITRIC ACID 500-334 MG/5ML PO SOLN: 30.0000 mL | ORAL | Status: DC | PRN

## 2020-07-24 MED ORDER — ONDANSETRON HCL 4 MG/2ML IJ SOLN: 4.0000 mg | INTRAMUSCULAR | Status: DC | PRN

## 2020-07-24 MED ORDER — ZIDOVUDINE 10 MG/ML IV SOLN
2.0000 mg/kg | Freq: Once | INTRAVENOUS | Status: AC
  Administered 2020-07-24: 154 mg via INTRAVENOUS
  Filled 2020-07-24: qty 15.4

## 2020-07-24 MED ORDER — ONDANSETRON HCL 4 MG PO TABS: 4.0000 mg | ORAL_TABLET | ORAL | Status: DC | PRN

## 2020-07-24 MED ORDER — ACETAMINOPHEN 325 MG PO TABS: 650.0000 mg | ORAL_TABLET | ORAL | Status: DC | PRN

## 2020-07-24 MED ORDER — DIPHENHYDRAMINE HCL 50 MG/ML IJ SOLN: 12.5000 mg | INTRAMUSCULAR | Status: DC | PRN

## 2020-07-24 MED ORDER — TETANUS-DIPHTH-ACELL PERTUSSIS 5-2.5-18.5 LF-MCG/0.5 IM SUSY: 0.5000 mL | PREFILLED_SYRINGE | Freq: Once | INTRAMUSCULAR | Status: DC

## 2020-07-24 NOTE — Progress Notes (Signed)
Attempting 2nd IV to be able to run Pitocin now if ordered or for after delivery. Iv attempted x2 by MEarly,RNC-ob without success. Another RN at [facility] retrying IV. Interpreter [phone] on phone. (Amharic). VSmith,CNM updated on pt status. Will come and recheck pt after IV started.

## 2020-07-24 NOTE — H&P (Signed)
HPI: Mary Zhang is a 30 y.o. year old G20P1102 female at [redacted]w[redacted]d weeks gestation who presents to L&D for IOL for HIV, undetectable viral load. In on Triumeq and followed by [facility].    Nursing Staff Provider  Office Location  [facility] Dating  LMP confirmed by 10 wk [place]  Language  Ahmeric Anatomy US  WNL   Flu Vaccine  05/03/20 Genetic Screen  NIPS:  Low risk female  AFP: Negative   TDaP vaccine   05/12/20 Hgb A1C or  GTT Third trimester Normal  Rhogam  N/A   LAB RESULTS     Blood Type --/--/A POS, A POS Performed at [facility], [address], [place], [place] [address]  [phone])   Feeding Plan  Bottle Antibody NEG (12/17 1409)  Contraception  Undecided but wants something more than condoms Rubella 3.54 (07/02 1056)  Circumcision  no RPR NON-REACTIVE (05/10 1132)   Pediatrician   HBsAg Negative (07/02 1056)   Support Person Husband  HCVAb Negative  Prenatal Classes  HIV Reactive (10/01 6734)     BTL Consent  GBS Negative/-- (12/11 0829)  VBAC Consent NA Pap 01/21/2019 - WNL (trich)    Hgb Electro  Nml  BP Cuff  CF Neg    SMA neg    Waterbirth  [ ]  Class [ ]  Consent [ ]  CNM visit    Induction  [ ]  Orders Entered [ ] Foley Y/N   OB History    Gravida  3   Para  2   Term  1   Preterm  1   AB      Living  2     SAB      IAB      Ectopic      Multiple  0   Live Births  2          Past Medical History:  Diagnosis Date  . Gestational thrombocytopenia (HCC)   . HIV disease (HCC) 09/30/2015  . Low blood pressure, not hypotension 01/02/2016  . Menorrhagia   . Shingles    Past Surgical History:  Procedure Laterality Date  . NO PAST SURGERIES     Family History: family history is not on file. Social History:  reports that she has never smoked. She has never used smokeless tobacco. She reports that she does not drink alcohol and does not use drugs.     Maternal Diabetes: No Genetic Screening: Normal Maternal Ultrasounds/Referrals: Normal Fetal  Ultrasounds or other Referrals:  Referred to Materal Fetal Medicine  Maternal Substance Abuse:  No Significant Maternal Medications:  Meds include: Other:  Significant Maternal Lab Results:  Group B Strep negative and HIV positive Other Comments:  Triumeq for HIV, AZT in labor  Review of Systems Maternal Medical History:  Reason for admission: Induction of labor for HIV  Contractions: Frequency: irregular.   Perceived severity is mild.    Fetal activity: Perceived fetal activity is normal.    Prenatal complications: HIV and thrombocytopenia.   No bleeding, PIH, infection, IUGR, oligohydramnios, polyhydramnios or pre-eclampsia.   Prenatal Complications - Diabetes: none.    Dilation: 5.5/50/Ballotable, BBOW, Vtx Exam by:: VSmith,cnm Temperature 97.9 F (36.6 C), temperature source Oral, resp. rate 20, last menstrual period 10/25/2019, unknown if currently breastfeeding. Maternal Exam:  Uterine Assessment: Contraction strength is mild.  Contraction frequency is irregular.   Abdomen: Patient reports no abdominal tenderness. Estimated fetal weight is 7-12.   Fetal presentation: vertex  Introitus: Normal  vulva. Vulva is negative for lesion.  Pelvis: adequate for delivery.   Cervix: Cervix evaluated by digital exam.     Fetal Exam Fetal Monitor Review: Baseline rate: 130.  Variability: moderate (6-25 bpm).   Pattern: accelerations present and no decelerations.    Fetal State Assessment: Category I - tracings are normal.     Physical Exam Constitutional:      General: She is not in acute distress.    Appearance: Normal appearance. She is normal weight. She is not ill-appearing.  Eyes:     Conjunctiva/sclera: Conjunctivae normal.  Cardiovascular:     Rate and Rhythm: Normal rate.  Pulmonary:     Effort: Pulmonary effort is normal. No respiratory distress.  Abdominal:     Tenderness: There is no abdominal tenderness.  Genitourinary:    General: Normal vulva.  Vulva  is no lesion.  Musculoskeletal:        General: No swelling or tenderness.  Skin:    General: Skin is warm and dry.  Neurological:     General: No focal deficit present.     Mental Status: She is alert and oriented to person, place, and time.  Psychiatric:        Mood and Affect: Mood normal.     Prenatal labs: ABO, Rh: A/Positive/-- (06/15 1128) Antibody: Negative (06/15 1128) Rubella: 3.02 (06/15 1128) RPR: Non Reactive (10/12 1011)  HBsAg: Negative (06/15 1128)  HIV: Reactive (06/15 1128)  GBS: Negative/-- (12/14 1044)   Assessment: 1. Labor: IOL 2. Fetal Wellbeing: Category I  3. Pain Control: None 4. GBS: Neg 5. 39.0 week IUP 6. HIV +. Undetectable viral load on Triumeq 7. Language barrier 8. Gestational thrombocytopenia   Plan:  1. Admit to BS per consult with MD 2. Routine L&D orders 3. Analgesia/anesthesia PRN  4. AZT 5. Start pitocin, AROM in 4 hours after AZT infusion complete due to advanced dilation. 6. Amharic interpreter used 7. Check Plt  Dorathy Kinsman 07/24/2020, 2:38 PM

## 2020-07-24 NOTE — Care Plan (Signed)
Pt scheduled for induction today (07/24/20).  Pt called twice by L&D secretary with no answer prior to 7am and once by day shift secretary around 9:30.  Pt spouse called back at 11am and stated they were trying to arrange childcare and may be able to be here by 1pm.

## 2020-07-24 NOTE — Discharge Summary (Signed)
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)   Short interval between pregnancies affecting pregnancy, antepartum   History of preterm delivery   HIV (human immunodeficiency virus) risk factors complicating pregnancy  Additional problems: as noted above   Discharge diagnosis: Term Pregnancy Delivered and HIV                                              Post partum procedures:none Augmentation: None Complications: None  Hospital course: Onset of Labor With Vaginal Delivery      30 y.o. yo V6H2094 at [redacted]w[redacted]d was admitted for IOL for HIV on 07/24/2020. AZT was started and plan was to start Pitocin 4 hours after AZT, but pt SROM'd and progressed spontaneously without augmentation. Patient had an uncomplicated labor course as follows:  Membrane Rupture Time/Date: 6:23 PM ,07/24/2020   Delivery Method:Vaginal, Spontaneous  Episiotomy: None  Lacerations:  None  Patient had an uncomplicated postpartum course. She was restarted on her Triumeq per ID s/p delivery; plan to f/u with ID in outpatient setting as previously scheduled. She is ambulating, tolerating a regular diet, passing flatus, and urinating well. Patient is discharged home in stable condition on 07/27/20.  Newborn Data: Birth date:07/24/2020  Birth time:6:47 PM  Gender:Female  Living status:Living  Apgars:9 ,9  Weight:3585 g   Magnesium Sulfate received: No BMZ received:  No Rhophylac:N/A MMR:N/A T-DaP:Given prenatally Flu: Yes Transfusion:No  Physical exam  Vitals:   07/26/20 0630 07/26/20 1241 07/26/20 2235 07/27/20 0728  BP: 103/65 110/67 (!) 97/52 98/73  Pulse: 96 88 79 71  Resp: $Remo'18 17 18 18  'nIRNs$ Temp: 98.6 F (37 C) 98.4 F (36.9 C) 98.3 F (36.8 C) 97.8 F (36.6 C)  TempSrc: Oral Oral Oral Oral  SpO2: 99% 99%  100%   General: alert, cooperative and no distress Lochia: appropriate Uterine Fundus: firm Incision: N/A DVT Evaluation: No evidence of DVT seen on physical exam. No cords or calf tenderness. No significant calf/ankle edema. Labs: Lab Results  Component Value Date   WBC 9.1 07/26/2020   HGB 13.0 07/26/2020   HCT 37.6 07/26/2020   MCV 95.2 07/26/2020   PLT 120 (L) 07/26/2020   CMP Latest Ref Rng & Units 07/26/2020  Glucose 70 - 99 mg/dL 92  BUN 6 - 20 mg/dL <5(L)  Creatinine 0.44 - 1.00 mg/dL 0.40(L)  Sodium 135 - 145 mmol/L 137  Potassium 3.5 - 5.1 mmol/L 3.8  Chloride 98 - 111 mmol/L 107  CO2 22 - 32 mmol/L 22  Calcium 8.9 - 10.3 mg/dL 9.1  Total Protein 6.5 - 8.1 g/dL 5.8(L)  Total Bilirubin 0.3 - 1.2 mg/dL 0.7  Alkaline Phos 38 - 126 U/L 79  AST 15 - 41 U/L 17  ALT 0 - 44 U/L 20   Edinburgh Score: Edinburgh Postnatal Depression Scale Screening Tool 07/27/2020  I have been able to laugh and see  the funny side of things. (No Data)  I have looked forward with enjoyment to things. -  I have blamed myself unnecessarily when things went wrong. -  I have been anxious or worried for no good reason. -  I have felt scared or panicky for no good reason. -  Things have been getting on top of me. -  I have been so unhappy that I have had difficulty sleeping. -  I have felt sad or miserable. -  I have been so unhappy that I have been crying. -  The thought of harming myself has occurred to me. Flavia Shipper Postnatal Depression Scale Total -     After visit meds:  Allergies as of 07/27/2020   No Known Allergies      Medication List    STOP taking these medications   AMBULATORY NON FORMULARY MEDICATION   clotrimazole-betamethasone cream Commonly known as: Lotrisone   doxylamine (Sleep) 25 MG tablet Commonly known as: UNISOM   Prenatal Vitamins 28-0.8 MG Tabs   vitamin B-6 25 MG tablet Commonly known as: pyridOXINE     TAKE these medications   acetaminophen 325 MG tablet Commonly known as: Tylenol Take 2 tablets (650 mg total) by mouth every 6 (six) hours as needed for mild pain, moderate pain, fever or headache. What changed:   when to take this  reasons to take this   acetaminophen 500 MG tablet Commonly known as: TYLENOL Take 2 tablets (1,000 mg total) by mouth every 6 (six) hours as needed. What changed: You were already taking a medication with the same name, and this prescription was added. Make sure you understand how and when to take each.   coconut oil Oil Apply 1 application topically as needed (nipple pain).   folic acid 1 MG tablet Commonly known as: FOLVITE Take 1 tablet (1 mg total) by mouth daily.   Folivane-OB 85-1 MG Caps Take 1 capsule by mouth daily.   ibuprofen 800 MG tablet Commonly known as: ADVIL Take 1 tablet (800 mg total) by mouth every 8 (eight) hours as needed.   norethindrone 0.35 MG tablet Commonly known as: Ortho Micronor Take 1 tablet (0.35 mg total) by mouth daily.   polyethylene glycol 17 g packet Commonly known as: MiraLax Take 17 g by mouth 2 (two) times daily.   Triumeq 600-50-300 MG tablet Generic drug: abacavir-dolutegravir-lamiVUDine Take 1 tablet by mouth daily.        Discharge home in stable condition Infant Feeding: Bottle Infant Disposition: home with mother Discharge instruction: per After Visit Summary and Postpartum booklet. Activity: Advance as tolerated. Pelvic rest for 6 weeks.  Diet: routine diet Future Appointments: Future Appointments  Date Time Provider [place]  08/22/2020 11:15 AM Carlyle Basques, MD [facility]  08/26/2020 10:30 AM Lavonia Drafts, MD [facility] None   Follow up Visit:  Please schedule this patient for a In person postpartum visit in 4 weeks with the following provider: Any provider. Additional Postpartum F/U:None  High risk pregnancy complicated by: HIV Delivery mode:  Vaginal, Spontaneous  Anticipated Birth Control:  POPs, rx sent to pharmacy.   10/21/9377 Arrie Senate, MD

## 2020-07-25 LAB — CBC
HCT: 35.6 % — ABNORMAL LOW (ref 36.0–46.0)
Hemoglobin: 12.6 g/dL (ref 12.0–15.0)
MCH: 33.1 pg (ref 26.0–34.0)
MCHC: 35.4 g/dL (ref 30.0–36.0)
MCV: 93.4 fL (ref 80.0–100.0)
Platelets: 108 10*3/uL — ABNORMAL LOW (ref 150–400)
RBC: 3.81 MIL/uL — ABNORMAL LOW (ref 3.87–5.11)
RDW: 13.2 % (ref 11.5–15.5)
WBC: 10.1 10*3/uL (ref 4.0–10.5)
nRBC: 0 % (ref 0.0–0.2)

## 2020-07-25 LAB — RPR: RPR Ser Ql: NONREACTIVE

## 2020-07-25 MED ORDER — ABACAVIR-DOLUTEGRAVIR-LAMIVUD 600-50-300 MG PO TABS
1.0000 | ORAL_TABLET | Freq: Every day | ORAL | Status: DC
  Administered 2020-07-25 – 2020-07-27 (×3): 1 via ORAL
  Filled 2020-07-25 (×3): qty 1

## 2020-07-25 NOTE — Discharge Instructions (Signed)

## 2020-07-25 NOTE — Progress Notes (Addendum)
Patient ID: Mary Zhang, female   DOB: 24-Nov-1990, 30 y.o.   MRN: [id number] POSTPARTUM PROGRESS NOTE  Post Partum Day 1  Subjective:  Mary Zhang is a 30 y.o. Q2I2979 s/p SVD at [redacted]w[redacted]d.IOL for HIV w/ undetectable VL.  No acute events overnight.  Pt reports she has not tried ambulating, voiding or po intake.  She denies nausea or vomiting.  Pain is moderately controlled and improving.  She has not had flatus. She has not had bowel movement.  Lochia Moderate.  Pt denies wanting any form of BC at this time. Is open to consider OCP in the future.  Objective: Blood pressure (!) 90/59, pulse 71, temperature 98 F (36.7 C), temperature source Oral, resp. rate 18, last menstrual period 10/25/2019, SpO2 100 %, unknown if currently breastfeeding.  Physical Exam:  General: alert, cooperative and no distress Chest: no respiratory distress Heart:distal pulses intact Abdomen: soft, moderately tender,  Uterine Fundus: firm, appropriately tender DVT Evaluation: No calf swelling or tenderness Extremities: no edema Skin: warm, dry  Recent Labs    07/24/20 1355 07/25/20 0523  HGB 13.6 12.6  HCT 38.6 35.6*    Assessment/Plan: Mary Zhang is a 30 y.o. G9Q1194 s/p IOL for HIV w/ undetectable VL at [redacted]w[redacted]d   PPD#1 - Doing well. Appropriately tender. HIV+ - AZT given 24 hours prior to delivery. Restarted Triumeq Contraception: declines at this time. Willing to consider OCP in the future Feeding: Bottle feeding with no concerns Dispo: Plan for discharge tomorrow 07/26/2018.   LOS: 1 day   Terrill Mohr, Medical Student 07/25/2020, 8:35 AM   Attestation of Supervision of Student:  I confirm that I have verified the information documented in the medical student's note and that I have also personally performed the history, physical exam and all medical decision making activities.  I have verified that all services and findings are accurately documented in this student's note; and I agree with management and plan  as outlined in the documentation. I have also made any necessary editorial changes.  She notes has urinated, last this morning without difficulty. Still not flatus or BM, mild abdominal pain and + bowel sounds on my exam this morning. Eating, drinking, ambulating well, no chest pain, shortness of breath, leg swelling, vomiting, or nausea.  She notes she is willing to do OCPs. She would like a circumcision but notes last time it was too expensive in the hospital for her son, wants to know if it was covered. CSW in room with me, plans for looking into price to see if could be done tomorrow before discharge. Of note, used Amharic interpretor [id number] for interview.  Billey Co, MD [facility], [facility] 07/25/2020 11:25 AM

## 2020-07-25 NOTE — Progress Notes (Signed)
CSW consulted to arrange specialty care for infant due to [name]'s HIV status. CSW went to speak with [name] at bedside to address further needs.   CSW used Ahmeric Interpretor ID number [id number] to speak with [name]. CSW advised [name] of the HIPPA policy in which CSW asked that [name] leave room and he did. CSW then advised [name] of the reason for CSW coming to speak with her. [name] indicated that she was on her medication while pregnant and that usually after she gives birth they change the medication back. [name] expressed that she is seen at [facility] for her care however [name] expressed that her children are seen at another location. CSW asked [name] if she recalled the location, however [name] expressed that she is not. [name] asked that CSW speak with her spouse as "he is the one who usually handles all the appointments and things". CSW then asked for permission to allow [name] back into the room so that CSW could get information needed, [name] agreeable. Before allowing [name] back into room. CSW asked [name] safety questions in which [name] expressed no SI, HI nor being involved in DV. [name] also reported that [name] is aware of her HIV status at this time.   CSW allowed [name] back in the room for remaining assessment. [name] reported that [name]'s HIV diagnosis is not new for her and that she has been living with it for 5 years in which she has always remained on her medications. [name] expressed their other children go to [facility] in [place] for care. [name] expressed that he would like new infant to be seen at the same clinic. CSW understanding and advised [name] and [name] that CSW would get an appointment at this location. [name] and [name] expressed that they have all needed items to care for infant with no other needs.   CSW inquired from [facility] on previous or current mental health hx in which [name] expressed having none. [name] reported that she has been feeling very happy since she has given birth as well. CSW provided [name] and  [name] with PPD and SIDS education. [name] was encouraged to assess herself daily and reach out to providers if feelings are unusual for [name]. [name] expressed understanding and reported no other needs.   Infant has a appointment with [facility] of 08/19/20 at 10am [name] and [name] updated of this appointment and given contact information for Ida with the clinic in the event that other needs arise regarding appointment time and date. No barrier's to discharge.    Mary Zhang, MSW, LCSW [facility] [phone]

## 2020-07-26 ENCOUNTER — Inpatient Hospital Stay (HOSPITAL_COMMUNITY): Payer: Medicaid Other

## 2020-07-26 LAB — COMPREHENSIVE METABOLIC PANEL
ALT: 20 U/L (ref 0–44)
AST: 17 U/L (ref 15–41)
Albumin: 2.4 g/dL — ABNORMAL LOW (ref 3.5–5.0)
Alkaline Phosphatase: 79 U/L (ref 38–126)
Anion gap: 8 (ref 5–15)
BUN: <5 mg/dL — ABNORMAL LOW (ref 6–20)
CO2: 22 mmol/L (ref 22–32)
Calcium: 9.1 mg/dL (ref 8.9–10.3)
Chloride: 107 mmol/L (ref 98–111)
Creatinine, Ser: 0.4 mg/dL — ABNORMAL LOW (ref 0.44–1.00)
GFR, Estimated: >60 mL/min (ref >60)
Glucose, Bld: 92 mg/dL (ref 70–99)
Potassium: 3.8 mmol/L (ref 3.5–5.1)
Sodium: 137 mmol/L (ref 135–145)
Total Bilirubin: 0.7 mg/dL (ref 0.3–1.2)
Total Protein: 5.8 g/dL — ABNORMAL LOW (ref 6.5–8.1)

## 2020-07-26 LAB — CBC
HCT: 37.6 % (ref 36.0–46.0)
Hemoglobin: 13 g/dL (ref 12.0–15.0)
MCH: 32.9 pg (ref 26.0–34.0)
MCHC: 34.6 g/dL (ref 30.0–36.0)
MCV: 95.2 fL (ref 80.0–100.0)
Platelets: 120 10*3/uL — ABNORMAL LOW (ref 150–400)
RBC: 3.95 MIL/uL (ref 3.87–5.11)
RDW: 13.4 % (ref 11.5–15.5)
WBC: 9.1 10*3/uL (ref 4.0–10.5)
nRBC: 0 % (ref 0.0–0.2)

## 2020-07-26 MED ORDER — POLYETHYLENE GLYCOL 3350 17 G PO PACK
17.0000 g | PACK | Freq: Every day | ORAL | Status: DC
  Administered 2020-07-26: 17 g via ORAL
  Filled 2020-07-26: qty 1

## 2020-07-26 MED ORDER — IOHEXOL 300 MG/ML  SOLN
100.0000 mL | Freq: Once | INTRAMUSCULAR | Status: AC | PRN
  Administered 2020-07-26: 100 mL via INTRAVENOUS

## 2020-07-26 MED ORDER — IBUPROFEN 800 MG PO TABS
800.0000 mg | ORAL_TABLET | Freq: Three times a day (TID) | ORAL | Status: DC
  Administered 2020-07-26 – 2020-07-27 (×4): 800 mg via ORAL
  Filled 2020-07-26 (×4): qty 1

## 2020-07-26 MED ORDER — ACETAMINOPHEN 500 MG PO TABS
1000.0000 mg | ORAL_TABLET | Freq: Three times a day (TID) | ORAL | Status: DC
  Administered 2020-07-26 – 2020-07-27 (×4): 1000 mg via ORAL
  Filled 2020-07-26 (×4): qty 2

## 2020-07-26 MED ORDER — LACTATED RINGERS IV SOLN: Freq: Once | INTRAVENOUS | Status: AC

## 2020-07-26 MED ORDER — POLYETHYLENE GLYCOL 3350 17 G PO PACK
17.0000 g | PACK | Freq: Two times a day (BID) | ORAL | Status: DC
  Administered 2020-07-26 – 2020-07-27 (×2): 17 g via ORAL
  Filled 2020-07-26 (×2): qty 1

## 2020-07-26 MED ORDER — OXYCODONE HCL 5 MG PO TABS
5.0000 mg | ORAL_TABLET | ORAL | Status: DC | PRN
  Administered 2020-07-26: 5 mg via ORAL
  Filled 2020-07-26: qty 1

## 2020-07-26 NOTE — Progress Notes (Signed)
RN spoke to Dr. Lynnda Shields about report from radiology. Plan is to make patient NPO and watch for pain increase, nausea, vomiting. Will update patient of plan and continue to monitor.

## 2020-07-26 NOTE — Progress Notes (Signed)
POSTPARTUM PROGRESS NOTE  Subjective: Mary Zhang is a 30 y.o. V3X5217 s/p vaginal delivery at [redacted]w[redacted]d. Pt reports severe abdominal pain this morning that started several hours ago. She denies issues with urination. Had a soft stool yesterday but not passing gas this morning. Minimal lochia.  Objective: Blood pressure 103/65, pulse 96, temperature 98.6 F (37 C), temperature source Oral, resp. rate 18, last menstrual period 10/25/2019, SpO2 99 %, unknown if currently breastfeeding.  Physical Exam:  General: alert, cooperative and no distress Chest: no respiratory distress Abdomen: moderately distended, diffuse tenderness to palpation, hypoactive bowel sounds Uterine Fundus: difficulty palpating in setting of severe abdominal pain Extremities: No calf swelling or tenderness  no LE edema  Recent Labs    07/24/20 1355 07/25/20 0523  HGB 13.6 12.6  HCT 38.6 35.6*    Assessment/Plan: Mary Zhang is a 30 y.o. G7F5953 s/p vaginal delivery at [redacted]w[redacted]d.   Routine Postpartum Care: Doing well, pain well-controlled.  -- Continue routine care, lactation support  -- Contraception: POPs -- Feeding: bottle -- Ileus: pt with severe abdominal pain on exam as noted above. X-ray consistent with Ileus today. Reassuring no nausea/vomiting. Soft BM yesterday. Will plan for serial abdominal exams. NPO except for meds. Miralax and enema this AM. Discontinued po iron given concern of worsening constipation.  Dispo: Plan for discharge--pending resolution of ileus.  Sheila Oats, MD OB Fellow, [facility] 07/26/2020 9:06 AM

## 2020-07-27 LAB — SURGICAL PATHOLOGY

## 2020-07-27 MED ORDER — NORETHINDRONE 0.35 MG PO TABS: 1.0000 | ORAL_TABLET | Freq: Every day | ORAL | 0 refills | Status: DC

## 2020-07-27 MED ORDER — IBUPROFEN 800 MG PO TABS: 800.0000 mg | ORAL_TABLET | Freq: Three times a day (TID) | ORAL | 0 refills | Status: DC | PRN

## 2020-07-27 MED ORDER — ACETAMINOPHEN 500 MG PO TABS: 1000.0000 mg | ORAL_TABLET | Freq: Four times a day (QID) | ORAL | 0 refills | Status: DC | PRN

## 2020-07-27 MED ORDER — POLYETHYLENE GLYCOL 3350 17 G PO PACK: 17.0000 g | PACK | Freq: Two times a day (BID) | ORAL | 0 refills | Status: DC

## 2020-07-27 MED ORDER — COCONUT OIL OIL: 1.0000 "application " | TOPICAL_OIL | 0 refills | Status: DC | PRN

## 2020-07-27 MED ORDER — ACETAMINOPHEN 325 MG PO TABS: 650.0000 mg | ORAL_TABLET | Freq: Four times a day (QID) | ORAL | 0 refills | Status: DC | PRN

## 2020-07-27 NOTE — Progress Notes (Signed)
Interpreter [phone] for discharge teaching - baby and [name]

## 2020-07-27 NOTE — Progress Notes (Deleted)
Post Partum Day 3 Subjective: up ad lib, voiding, tolerating PO, + flatus and feeling much better, decreased abdominal pain  Objective: Blood pressure 98/73, pulse 71, temperature 97.8 F (36.6 C), temperature source Oral, resp. rate 18, last menstrual period 10/25/2019, SpO2 100 %, unknown if currently breastfeeding.  Physical Exam:  General: alert, cooperative, appears stated age and no distress  Abd: still mild tenderness worst in RLQ, soft, normoactive bowel sounds Lochia: appropriate Uterine Fundus: firm Incision: no incision DVT Evaluation: No evidence of DVT seen on physical exam.  Recent Labs    07/25/20 0523 07/26/20 1747  HGB 12.6 13.0  HCT 35.6* 37.6    Assessment/Plan: Discharge home  CT a/p without obstruction or other acute abnormality other than moderate fecal retention, labs WNL. Patient has improved pain, passing flatus. Recommended discontinued PO iron to prevent worsening of constipation, and bowel regimen with miralax on discharge.   LOS: 3 days   [facility] 07/27/2020, 8:04 AM

## 2020-08-22 ENCOUNTER — Encounter: Payer: Self-pay | Admitting: Internal Medicine

## 2020-08-22 ENCOUNTER — Ambulatory Visit (INDEPENDENT_AMBULATORY_CARE_PROVIDER_SITE_OTHER): Payer: Medicaid Other | Admitting: Internal Medicine

## 2020-08-22 ENCOUNTER — Other Ambulatory Visit: Payer: Self-pay

## 2020-08-22 VITALS — BP 99/64 | HR 86 | Temp 97.7°F | Wt 159.0 lb

## 2020-08-22 DIAGNOSIS — Z79899 Other long term (current) drug therapy: Secondary | ICD-10-CM | POA: Diagnosis present

## 2020-08-22 DIAGNOSIS — Z3009 Encounter for other general counseling and advice on contraception: Secondary | ICD-10-CM | POA: Diagnosis not present

## 2020-08-22 DIAGNOSIS — B20 Human immunodeficiency virus [HIV] disease: Secondary | ICD-10-CM

## 2020-08-22 MED ORDER — BICTEGRAVIR-EMTRICITAB-TENOFOV 50-200-25 MG PO TABS: 1.0000 | ORAL_TABLET | Freq: Every day | ORAL | 11 refills | Status: DC

## 2020-08-22 NOTE — Progress Notes (Signed)
Patient ID: Mary Zhang, female   DOB: 08/07/1990, 30 y.o.   MRN: [id number]  HPI 29yo F who speaks amharic has well controlled HIV disease, on triumeq who is  [id number] at [redacted]w[redacted]d who had uncomplicated labor course on 07/24/2020 then switched back to triumeq post delivery. Had healthy boy of 3.5kg. her son's name is Mary Zhang  Wants to change to smaller pill.  Had her second covid vaccine on 07/01/20  Does not have appt with gyn for follow up. No bleeding. Started having sex again. No pain with sex.   Her son also has appt coming up. Has given her son medicine for 28 days and not completed  Outpatient Encounter Medications as of 08/22/2020  Medication Sig  . abacavir-dolutegravir-lamiVUDine (TRIUMEQ) 600-50-300 MG tablet Take 1 tablet by mouth daily.  Marland Kitchen acetaminophen (TYLENOL) 325 MG tablet Take 2 tablets (650 mg total) by mouth every 6 (six) hours as needed for mild pain, moderate pain, fever or headache.  Marland Kitchen acetaminophen (TYLENOL) 500 MG tablet Take 2 tablets (1,000 mg total) by mouth every 6 (six) hours as needed.  . coconut oil OIL Apply 1 application topically as needed (nipple pain).  . folic acid (FOLVITE) 1 MG tablet Take 1 tablet (1 mg total) by mouth daily.  Marland Kitchen ibuprofen (ADVIL) 800 MG tablet Take 1 tablet (800 mg total) by mouth every 8 (eight) hours as needed.  . norethindrone (ORTHO MICRONOR) 0.35 MG tablet Take 1 tablet (0.35 mg total) by mouth daily.  . polyethylene glycol (MIRALAX) 17 g packet Take 17 g by mouth 2 (two) times daily.  Burnis Medin w/o A Vit-FeFum-FePo-FA (FOLIVANE-OB) 85-1 MG CAPS Take 1 capsule by mouth daily.   No facility-administered encounter medications on file as of 08/22/2020.     Patient Active Problem List   Diagnosis Date Noted  . HIV (human immunodeficiency virus) risk factors complicating pregnancy 07/24/2020  . Vaginal delivery 07/24/2020  . Tinea 06/28/2020  . Short interval between pregnancies affecting pregnancy, antepartum 01/05/2020  .  Supervision of high risk pregnancy, antepartum 01/05/2020  . History of preterm delivery 01/05/2020  . 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 Language barrier 01/22/2019  . History of preterm premature rupture of membranes 01/22/2019  . Thrombocytopenia affecting pregnancy, antepartum (HCC) 01/22/2019  . Lead exposure 12/16/2015  . Raised antibody titer 10/25/2015     Health Maintenance Due  Topic Date Due  . COVID-19 Vaccine (3 - Pfizer risk 4-dose series) 07/29/2020     Review of Systems  Constitutional: Negative for fever, chills, diaphoresis, activity change, appetite change, fatigue and unexpected weight change.  HENT: Negative for congestion, sore throat, rhinorrhea, sneezing, trouble swallowing and sinus pressure.  Eyes: Negative for photophobia and visual disturbance.  Respiratory: Negative for cough, chest tightness, shortness of breath, wheezing and stridor.  Cardiovascular: Negative for chest pain, palpitations and leg swelling.  Gastrointestinal: Negative for nausea, vomiting, abdominal pain, diarrhea, constipation, blood in stool, abdominal distention and anal bleeding.  Genitourinary: Negative for dysuria, hematuria, flank pain and difficulty urinating.  Musculoskeletal: Negative for myalgias, back pain, joint swelling, arthralgias and gait problem.  Skin: Negative for color change, pallor, rash and wound.  Neurological: Negative for dizziness, tremors, weakness and light-headedness.  Hematological: Negative for adenopathy. Does not bruise/bleed easily.  Psychiatric/Behavioral: Negative for behavioral problems, confusion, sleep disturbance, dysphoric mood, decreased concentration and agitation.    Physical Exam   BP 99/64   Pulse 86   Temp 97.7 F (36.5 C) (  Oral)   Wt 159 lb (72.1 kg)   SpO2 100%   BMI 29.08 kg/m  Physical Exam  Constitutional:  oriented to person, place, and time. appears well-developed and well-nourished. No  distress.  HENT: [place]/AT, PERRLA, no scleral icterus Mouth/Throat: Oropharynx is clear and moist. No oropharyngeal exudate.  Cardiovascular: Normal rate, regular rhythm and normal heart sounds. Exam reveals no gallop and no friction rub.  No murmur heard.  Pulmonary/Chest: Effort normal and breath sounds normal. No respiratory distress.  has no wheezes.  Neck = supple, no nuchal rigidity Abdominal: Soft. Bowel sounds are normal.  exhibits no distension. There is no tenderness.  Lymphadenopathy: no cervical adenopathy. No axillary adenopathy Neurological: alert and oriented to person, place, and time.  Skin: Skin is warm and dry. No rash noted. No erythema.  Psychiatric: a normal mood and affect.  behavior is normal.    Lab Results  Component Value Date   CD4TCELL 32 (L) 02/22/2020   Lab Results  Component Value Date   CD4TABS 364 (L) 02/22/2020   CD4TABS 370 (L) 11/30/2019   CD4TABS 469 10/07/2019   Lab Results  Component Value Date   HIV1RNAQUANT <20 05/25/2020   No results found for: HEPBSAB Lab Results  Component Value Date   LABRPR NON REACTIVE 07/24/2020    CBC Lab Results  Component Value Date   WBC 9.1 07/26/2020   RBC 3.95 07/26/2020   HGB 13.0 07/26/2020   HCT 37.6 07/26/2020   PLT 120 (L) 07/26/2020   MCV 95.2 07/26/2020   MCH 32.9 07/26/2020   MCHC 34.6 07/26/2020   RDW 13.4 07/26/2020   LYMPHSABS 1,072 02/22/2020   MONOABS 384 02/12/2017   EOSABS 40 02/22/2020    BMET Lab Results  Component Value Date   NA 137 07/26/2020   K 3.8 07/26/2020   CL 107 07/26/2020   CO2 22 07/26/2020   GLUCOSE 92 07/26/2020   BUN <5 (L) 07/26/2020   CREATININE 0.40 (L) 07/26/2020   CALCIUM 9.1 07/26/2020   GFRNONAA >60 07/26/2020   GFRAA 163 02/22/2020      Assessment and Plan  Post-delivery/birth control = Needs follow up for gyn  She is planning to do OTC pills for birth control.(doesn't want to do depo injection since she had still vaginal bleeding that  she didn't want). Gave birth control counseling.  discussed that she has 28 year old,  28 year old, and newborn - needs help at home, she is asking for letter of support for her mother to come to visit from [place] for 6 months visit.  hiv disease= well controlled. But would like to go back to her previous regimen Will change to biktarvy, [employer]. Gave 1 bottle while in clinic  Long term medication management = cr is stable  Health maintenance = up todate on vaccines   will come back to clinic in 3 months

## 2020-08-26 ENCOUNTER — Ambulatory Visit (INDEPENDENT_AMBULATORY_CARE_PROVIDER_SITE_OTHER): Payer: Medicaid Other | Admitting: Obstetrics & Gynecology

## 2020-08-26 ENCOUNTER — Encounter: Payer: Self-pay | Admitting: Obstetrics & Gynecology

## 2020-08-26 DIAGNOSIS — B20 Human immunodeficiency virus [HIV] disease: Secondary | ICD-10-CM

## 2020-08-26 DIAGNOSIS — Z21 Asymptomatic human immunodeficiency virus [HIV] infection status: Secondary | ICD-10-CM

## 2020-08-26 HISTORY — DX: Asymptomatic human immunodeficiency virus (hiv) infection status: Z21

## 2020-08-26 HISTORY — DX: Human immunodeficiency virus (HIV) disease: B20

## 2020-08-26 NOTE — Progress Notes (Signed)
Post Partum Visit Note (AMN Amharic Interpreter used:[place] [phone])  Mary Zhang is a 30 y.o. [phone] female who presents for a postpartum visit. She is 4 weeks postpartum following a normal spontaneous vaginal delivery.  I have fully reviewed the prenatal and intrapartum course. Patient has history of HIV infection, on Biktarvy and followed by ID. The delivery was at 39 gestational weeks.  Anesthesia: none. Postpartum course has been uneventful. Baby is doing well. Baby is feeding by bottle. Bleeding no bleeding. Bowel function is normal. Bladder function is normal. Patient is sexually active. Contraception method is oral progesterone-only contraceptive. Postpartum depression screening: Negative score 1  The pregnancy intention screening data noted above was reviewed. Potential methods of contraception were discussed. The patient elected to proceed with Oral Contraceptive.    Edinburgh Postnatal Depression Scale - 08/26/20 1043      Edinburgh Postnatal Depression Scale:  In the Past 7 Days   I have been able to laugh and see the funny side of things. 1    I have looked forward with enjoyment to things. 0    I have blamed myself unnecessarily when things went wrong. 0    I have been anxious or worried for no good reason. 0    I have felt scared or panicky for no good reason. 0    Things have been getting on top of me. 0    I have been so unhappy that I have had difficulty sleeping. 0    I have felt sad or miserable. 0    I have been so unhappy that I have been crying. 0    The thought of harming myself has occurred to me. 0    Edinburgh Postnatal Depression Scale Total 1          The following portions of the patient's history were reviewed and updated as appropriate: allergies, current medications, past family history, past medical history, past social history, past surgical history and problem list.  Review of Systems  Pertinent items noted in HPI and remainder of comprehensive  ROS otherwise negative.    Objective:  BP 121/70   Pulse 95   Wt 158 lb (71.7 kg)   BMI 28.90 kg/m    General:  alert and no distress  Lungs: clear to auscultation bilaterally  Heart:  regular rate and rhythm  Abdomen: soft, non-tender; bowel sounds normal; no masses,  no organomegaly   Pelvic:  not evaluated        Assessment:   Normal postpartum exam. Pap smear not done at today's visit.   Plan:   Essential components of care per ACOG recommendations:  1.  Mood and well being: Patient with negative depression screening today. Reviewed local resources for support.  - Patient does not use tobacco.  - hx of drug use? No  2. Infant care and feeding:  -Patient currently breastmilk feeding? No  -Social determinants of health (SDOH) reviewed in EPIC. No concerns.  3. Sexuality, contraception and birth spacing - Patient does not want a pregnancy in the next year.  Desired family size is 3 children for now.  - Reviewed forms of contraception in tiered fashion. Patient desired oral progesterone-only contraceptive today, already has prescription. Will start taking today. No intercourse for 2 more weeks.   - Discussed birth spacing of 18 months  4. Sleep and fatigue -Encouraged family/partner/community support of 4 hrs of uninterrupted sleep to help with mood and fatigue  5. Physical Recovery  - Discussed  patients delivery - Patient has urinary incontinence? No - Patient is safe to resume physical and sexual activity (6 weeks postpartum for sexual activity)  6.  Health Maintenance - Last pap smear done 01/22/2019 and was normal.  7. HIV infection - Follow up with ID as scheduled. Continue Biktarvy.    Jaynie Collins, MD [facility] [employer], [facility]

## 2020-11-21 ENCOUNTER — Ambulatory Visit (INDEPENDENT_AMBULATORY_CARE_PROVIDER_SITE_OTHER): Payer: Medicaid Other | Admitting: Internal Medicine

## 2020-11-21 ENCOUNTER — Other Ambulatory Visit: Payer: Self-pay

## 2020-11-21 ENCOUNTER — Encounter: Payer: Self-pay | Admitting: Internal Medicine

## 2020-11-21 VITALS — BP 99/69 | HR 89 | Resp 16 | Ht 62.0 in | Wt 164.4 lb

## 2020-11-21 DIAGNOSIS — Z3009 Encounter for other general counseling and advice on contraception: Secondary | ICD-10-CM | POA: Diagnosis not present

## 2020-11-21 DIAGNOSIS — B20 Human immunodeficiency virus [HIV] disease: Secondary | ICD-10-CM | POA: Diagnosis present

## 2020-11-21 DIAGNOSIS — Z79899 Other long term (current) drug therapy: Secondary | ICD-10-CM

## 2020-11-21 MED ORDER — NORELGESTROMIN-ETH ESTRADIOL 150-35 MCG/24HR TD PTWK: 1.0000 | MEDICATED_PATCH | TRANSDERMAL | 12 refills | Status: DC

## 2020-11-21 MED ORDER — NORELGESTROMIN-ETH ESTRADIOL 150-35 MCG/24HR TD PTWK
MEDICATED_PATCH | TRANSDERMAL | 12 refills | Status: DC
  Filled 2020-11-21: qty 3, 28d supply, fill #0
  Filled 2020-12-29: qty 3, 28d supply, fill #1

## 2020-11-21 NOTE — Progress Notes (Signed)
RFV: follow up for hiv disease  Patient ID: Mary Zhang, female   DOB: 12/05/1990, 30 y.o.   MRN: [id number]  HPI Mary Zhang is a 30yo F with well controlled hiv disease, she has  3 young kids. She continues to be well controlled on biktarvy. And also taking oral birth control for now. She is fatigued and some poor sleep with her infant. Otherwise is in good health.  Outpatient Encounter Medications as of 11/21/2020  Medication Sig  . bictegravir-emtricitabine-tenofovir AF (BIKTARVY) 50-200-25 MG TABS tablet Take 1 tablet by mouth daily.  . norethindrone (ORTHO MICRONOR) 0.35 MG tablet Take 1 tablet (0.35 mg total) by mouth daily.  . [DISCONTINUED] acetaminophen (TYLENOL) 325 MG tablet Take 2 tablets (650 mg total) by mouth every 6 (six) hours as needed for mild pain, moderate pain, fever or headache.  . [DISCONTINUED] acetaminophen (TYLENOL) 500 MG tablet Take 2 tablets (1,000 mg total) by mouth every 6 (six) hours as needed.  . [DISCONTINUED] coconut oil OIL Apply 1 application topically as needed (nipple pain).  . [DISCONTINUED] folic acid (FOLVITE) 1 MG tablet Take 1 tablet (1 mg total) by mouth daily.  . [DISCONTINUED] polyethylene glycol (MIRALAX) 17 g packet Take 17 g by mouth 2 (two) times daily.   No facility-administered encounter medications on file as of 11/21/2020.     Patient Active Problem List   Diagnosis Date Noted  . HIV infection (HCC) 08/26/2020  . Tinea 06/28/2020  . Language barrier 01/22/2019  . Lead exposure 12/16/2015     Health Maintenance Due  Topic Date Due  . COVID-19 Vaccine (3 - Pfizer risk 4-dose series) 07/29/2020     Review of Systems Review of Systems  Constitutional: Negative for fever, chills, diaphoresis, activity change, appetite change, fatigue and unexpected weight change.  HENT: Negative for congestion, sore throat, rhinorrhea, sneezing, trouble swallowing and sinus pressure.  Eyes: Negative for photophobia and visual disturbance.  Respiratory:  Negative for cough, chest tightness, shortness of breath, wheezing and stridor.  Cardiovascular: Negative for chest pain, palpitations and leg swelling.  Gastrointestinal: Negative for nausea, vomiting, abdominal pain, diarrhea, constipation, blood in stool, abdominal distention and anal bleeding.  Genitourinary: Negative for dysuria, hematuria, flank pain and difficulty urinating.  Musculoskeletal: Negative for myalgias, back pain, joint swelling, arthralgias and gait problem.  Skin: Negative for color change, pallor, rash and wound.  Neurological: Negative for dizziness, tremors, weakness and light-headedness.  Hematological: Negative for adenopathy. Does not bruise/bleed easily.  Psychiatric/Behavioral: Negative for behavioral problems, confusion, sleep disturbance, dysphoric mood, decreased concentration and agitation.   Sochx: does not drink alcohol or smoke  Physical Exam   BP 99/69   Pulse 89   Resp 16   Ht 5\' 2"  (1.575 m)   Wt 164 lb 6.4 oz (74.6 kg)   LMP 11/07/2020 (Approximate)   SpO2 99%   BMI 30.07 kg/m   Physical Exam  Constitutional:  oriented to person, place, and time. appears well-developed and well-nourished. No distress.  HENT: [place]/AT, PERRLA, no scleral icterus Mouth/Throat: Oropharynx is clear and moist. No oropharyngeal exudate.  Cardiovascular: Normal rate, regular rhythm and normal heart sounds. Exam reveals no gallop and no friction rub.  No murmur heard.  Pulmonary/Chest: Effort normal and breath sounds normal. No respiratory distress.  has no wheezes.  Neck = supple, no nuchal rigidity Abdominal: Soft. Bowel sounds are normal.  exhibits no distension. There is no tenderness.  Lymphadenopathy: no cervical adenopathy. No axillary adenopathy Neurological: alert and oriented to person, place,  and time.  Skin: Skin is warm and dry. No rash noted. No erythema.  Psychiatric: a normal mood and affect.  behavior is normal.   Lab Results  Component Value Date    CD4TCELL 32 (L) 02/22/2020   Lab Results  Component Value Date   CD4TABS 364 (L) 02/22/2020   CD4TABS 370 (L) 11/30/2019   CD4TABS 469 10/07/2019   Lab Results  Component Value Date   HIV1RNAQUANT <20 05/25/2020   No results found for: HEPBSAB Lab Results  Component Value Date   LABRPR NON REACTIVE 07/24/2020    CBC Lab Results  Component Value Date   WBC 9.1 07/26/2020   RBC 3.95 07/26/2020   HGB 13.0 07/26/2020   HCT 37.6 07/26/2020   PLT 120 (L) 07/26/2020   MCV 95.2 07/26/2020   MCH 32.9 07/26/2020   MCHC 34.6 07/26/2020   RDW 13.4 07/26/2020   LYMPHSABS 1,072 02/22/2020   MONOABS 384 02/12/2017   EOSABS 40 02/22/2020    BMET Lab Results  Component Value Date   NA 137 07/26/2020   K 3.8 07/26/2020   CL 107 07/26/2020   CO2 22 07/26/2020   GLUCOSE 92 07/26/2020   BUN <5 (L) 07/26/2020   CREATININE 0.40 (L) 07/26/2020   CALCIUM 9.1 07/26/2020   GFRNONAA >60 07/26/2020   GFRAA 163 02/22/2020      Assessment and Plan  hiv disease = labs today. She will likely still keep with biktarvy -since not missing dosese  Health maintenance = continues to take birth control. But she is worried about missing doses of pill. She is interested in other delivery methods. She is not interested in depo. We will give her the patch  Long term medication = will check cr. Anticipate to be stable

## 2020-11-22 ENCOUNTER — Other Ambulatory Visit: Payer: Self-pay

## 2020-11-22 LAB — T-HELPER CELL (CD4) - (RCID CLINIC ONLY)
CD4 % Helper T Cell: 35 % (ref 33–65)
CD4 T Cell Abs: 389 /uL — ABNORMAL LOW (ref 400–1790)

## 2020-11-23 LAB — HIV-1 RNA QUANT-NO REFLEX-BLD
HIV 1 RNA Quant: NOT DETECTED Copies/mL
HIV-1 RNA Quant, Log: NOT DETECTED Log cps/mL

## 2020-12-29 ENCOUNTER — Other Ambulatory Visit: Payer: Self-pay

## 2020-12-30 ENCOUNTER — Other Ambulatory Visit: Payer: Self-pay

## 2021-01-12 ENCOUNTER — Ambulatory Visit (INDEPENDENT_AMBULATORY_CARE_PROVIDER_SITE_OTHER): Payer: Medicaid Other | Admitting: Pharmacist

## 2021-01-12 ENCOUNTER — Other Ambulatory Visit: Payer: Self-pay

## 2021-01-12 ENCOUNTER — Other Ambulatory Visit: Payer: Self-pay | Admitting: Pharmacist

## 2021-01-12 DIAGNOSIS — R110 Nausea: Secondary | ICD-10-CM

## 2021-01-12 DIAGNOSIS — B20 Human immunodeficiency virus [HIV] disease: Secondary | ICD-10-CM | POA: Diagnosis not present

## 2021-01-12 MED ORDER — ONDANSETRON 4 MG PO TBDP: 4.0000 mg | ORAL_TABLET | Freq: Three times a day (TID) | ORAL | 0 refills | Status: DC | PRN

## 2021-01-16 ENCOUNTER — Ambulatory Visit: Payer: Medicaid Other | Admitting: Pharmacist

## 2021-01-16 NOTE — Progress Notes (Signed)
Virtual Visit via Telephone Note  I connected with [name] on 01/12/2021 at 03:00 PM by telephone and verified that I am speaking with the correct person using two identifiers.  Location: Patient: Home Provider: Office   I discussed the limitations, risks, security and privacy concerns of performing an evaluation and management service by telephone and the availability of in person appointments. I also discussed with the patient that there may be a patient responsible charge related to this service. The patient expressed understanding and agreed to proceed.  HPI: Mary Zhang is a 30 y.o. female who connected with me over the phone to discuss medication side effects.  Patient Active Problem List   Diagnosis Date Noted   HIV infection (HCC) 08/26/2020   Tinea 06/28/2020   Language barrier 01/22/2019   Lead exposure 12/16/2015    Patient's Medications  New Prescriptions   ONDANSETRON (ZOFRAN ODT) 4 MG DISINTEGRATING TABLET    Take 1 tablet (4 mg total) by mouth every 8 (eight) hours as needed for nausea or vomiting. Take 30 minutes before Biktarvy.  Previous Medications   BICTEGRAVIR-EMTRICITABINE-TENOFOVIR AF (BIKTARVY) 50-200-25 MG TABS TABLET    Take 1 tablet by mouth daily.   NORELGESTROMIN-ETHINYL ESTRADIOL (ORTHO EVRA) 150-35 MCG/24HR TRANSDERMAL PATCH    Place 1 patch onto the skin once a week.   NORELGESTROMIN-ETHINYL ESTRADIOL (ORTHO EVRA) 150-35 MCG/24HR TRANSDERMAL PATCH    Place 1 PATCH onto the skin ONCE A WEEK.   NORETHINDRONE (ORTHO MICRONOR) 0.35 MG TABLET    Take 1 tablet (0.35 mg total) by mouth daily.  Modified Medications   No medications on file  Discontinued Medications   No medications on file    Allergies: No Known Allergies  Past Medical History: Past Medical History:  Diagnosis Date   Gestational thrombocytopenia (HCC)    HIV disease (HCC) 09/30/2015   HIV infection (HCC) 08/26/2020   Low blood pressure, not hypotension 01/02/2016   Menorrhagia     Raised antibody titer 10/25/2015   High toxoplasma gondi antibody titer   Shingles     Social History: Social History   Socioeconomic History   Marital status: Married    Spouse name: Not on file   Number of children: Not on file   Years of education: Not on file   Highest education level: Not on file  Occupational History   Not on file  Tobacco Use   Smoking status: Never   Smokeless tobacco: Never  Vaping Use   Vaping Use: Never used  Substance and Sexual Activity   Alcohol use: No   Drug use: No   Sexual activity: Yes    Partners: Male    Birth control/protection: None    Comment: declined condoms  Other Topics Concern   Not on file  Social History Narrative   Not on file   Social Determinants of Health   Financial Resource Strain: Not on file  Food Insecurity: Not on file  Transportation Needs: Not on file  Physical Activity: Not on file  Stress: Not on file  Social Connections: Not on file    Labs: Lab Results  Component Value Date   HIV1RNAQUANT Not Detected 11/21/2020   HIV1RNAQUANT <20 05/25/2020   HIV1RNAQUANT <20 02/22/2020   CD4TABS 389 (L) 11/21/2020   CD4TABS 364 (L) 02/22/2020   CD4TABS 370 (L) 11/30/2019    RPR and STI Lab Results  Component Value Date   LABRPR NON REACTIVE 07/24/2020   LABRPR Non Reactive 05/03/2020  LABRPR Non Reactive 01/05/2020   LABRPR NON-REACTIVE 11/30/2019   LABRPR NON-REACTIVE 10/07/2019    STI Results GC CT  07/05/2020 Negative Negative  01/05/2020 Negative Negative  11/30/2019 Negative Negative  07/03/2019 Negative Negative  06/26/2019 Negative Negative  04/23/2019 Negative Negative  01/22/2019 Negative Negative  08/26/2018 Negative Negative  12/14/2015 - Negative    Hepatitis B Lab Results  Component Value Date   HEPBSAG Negative 01/05/2020   Hepatitis C Lab Results  Component Value Date   HEPCAB Negative 03/21/2016   Hepatitis A No results found for: HAV Lipids: Lab Results  Component  Value Date   CHOL 122 08/26/2018   TRIG 89 08/26/2018   HDL 42 (L) 08/26/2018   CHOLHDL 2.9 08/26/2018   LDLCALC 63 08/26/2018    Current HIV Regimen: Biktarvy  Assessment: I spoke to [name] and her husband on the phone today as a work-in virtual appointment. She is currently taking birth control and Biktarvy and experiencing headaches and nausea. She states the nausea is after she takes her Biktarvy with her birth control. She has no other issues such as blurred vision, shortness of breath, fever, abdominal pain, diarrhea, or a sore throat. She is currently not taking anything for the headaches.  I asked her to separate the birth control and Biktarvy administration. I will send in zofran ODT for her and asked her to take it 30 minutes before her Biktarvy. I also asked her husband to buy her some ibuprofen OTC to take for headaches. Asked her to take 400 mg BID to see if it helped. They verbalized understanding.  Plan: - Continue Biktarvy - Separate Biktarvy and birth control - Zofran ODT 4 mg q8h PRN nausea - Ibuprofen 400 mg (2 tablets) BID for headaches - Call if symptoms don't resolve   I discussed the assessment and treatment plan with the patient. The patient was provided an opportunity to ask questions and all were answered. The patient agreed with the plan and demonstrated an understanding of the instructions.   The patient was advised to call back or seek an in-person evaluation if the symptoms worsen or if the condition fails to improve as anticipated.  I provided 10 minutes of non-face-to-face time during this encounter.  Jarelyn Bambach L. Elridge Stemm, PharmD, BCIDP, AAHIVP, CPP Clinical Pharmacist Practitioner Infectious Diseases Clinical Pharmacist [facility] 01/16/2021, 9:41 AM

## 2021-03-20 ENCOUNTER — Ambulatory Visit (INDEPENDENT_AMBULATORY_CARE_PROVIDER_SITE_OTHER): Payer: Medicaid Other | Admitting: Internal Medicine

## 2021-03-20 ENCOUNTER — Other Ambulatory Visit: Payer: Self-pay

## 2021-03-20 VITALS — BP 98/67 | HR 81 | Resp 16 | Ht 62.0 in | Wt 164.3 lb

## 2021-03-20 DIAGNOSIS — B20 Human immunodeficiency virus [HIV] disease: Secondary | ICD-10-CM

## 2021-03-20 DIAGNOSIS — Z3042 Encounter for surveillance of injectable contraceptive: Secondary | ICD-10-CM

## 2021-03-20 LAB — POCT URINE PREGNANCY: Preg Test, Ur: NEGATIVE

## 2021-03-20 MED ORDER — MEDROXYPROGESTERONE ACETATE 150 MG/ML IM SUSP
150.0000 mg | Freq: Once | INTRAMUSCULAR | Status: AC
  Administered 2021-03-20: 150 mg via INTRAMUSCULAR

## 2021-03-20 NOTE — Progress Notes (Signed)
Patient ID: Mary Zhang, female   DOB: 10/12/1990, 30 y.o.   MRN: [id number]  HPI 30yo F with well controlled HIV disease, currently on biktarvy with 3 children under 61 yo, youngest is 8 months  The birth control patch had side effects for her, now back to taking birth control pills. She has missed one dose of birth control  Interested in doing the injection Q 35months. Last menstrual cycle last month of 28th Wanted to be on birth control for the next year before having another child   Outpatient Encounter Medications as of 03/20/2021  Medication Sig   bictegravir-emtricitabine-tenofovir AF (BIKTARVY) 50-200-25 MG TABS tablet Take 1 tablet by mouth daily.   norelgestromin-ethinyl estradiol (ORTHO EVRA) 150-35 MCG/24HR transdermal patch Place 1 patch onto the skin once a week.   norelgestromin-ethinyl estradiol (ORTHO EVRA) 150-35 MCG/24HR transdermal patch Place 1 PATCH onto the skin ONCE A WEEK.   norethindrone (ORTHO MICRONOR) 0.35 MG tablet Take 1 tablet (0.35 mg total) by mouth daily.   ondansetron (ZOFRAN ODT) 4 MG disintegrating tablet Take 1 tablet (4 mg total) by mouth every 8 (eight) hours as needed for nausea or vomiting. Take 30 minutes before Biktarvy.   No facility-administered encounter medications on file as of 03/20/2021.     Patient Active Problem List   Diagnosis Date Noted   HIV infection (HCC) 08/26/2020   Tinea 06/28/2020   Language barrier 01/22/2019   Lead exposure 12/16/2015     Health Maintenance Due  Topic Date Due   COVID-19 Vaccine (3 - Pfizer risk series) 07/29/2020   INFLUENZA VACCINE  02/20/2021     Review of Systems Review of Systems  Constitutional: Negative for fever, chills, diaphoresis, activity change, appetite change, fatigue and unexpected weight change.  HENT: Negative for congestion, sore throat, rhinorrhea, sneezing, trouble swallowing and sinus pressure.  Eyes: Negative for photophobia and visual disturbance.  Respiratory:  Negative for cough, chest tightness, shortness of breath, wheezing and stridor.  Cardiovascular: Negative for chest pain, palpitations and leg swelling.  Gastrointestinal: Negative for nausea, vomiting, abdominal pain, diarrhea, constipation, blood in stool, abdominal distention and anal bleeding.  Genitourinary: Negative for dysuria, hematuria, flank pain and difficulty urinating.  Musculoskeletal: Negative for myalgias, back pain, joint swelling, arthralgias and gait problem.  Skin: Negative for color change, pallor, rash and wound.  Neurological: Negative for dizziness, tremors, weakness and light-headedness.  Hematological: Negative for adenopathy. Does not bruise/bleed easily.  Psychiatric/Behavioral: Negative for behavioral problems, confusion, sleep disturbance, dysphoric mood, decreased concentration and agitation.   Physical Exam   BP 98/67   Pulse 81   Resp 16   Ht 5\' 2"  (1.575 m)   Wt 164 lb 4.8 oz (74.5 kg)   SpO2 99%   BMI 30.05 kg/m   Physical Exam  Constitutional:  oriented to person, place, and time. appears well-developed and well-nourished. No distress.  HENT: [place]/AT, PERRLA, no scleral icterus Mouth/Throat: Oropharynx is clear and moist. No oropharyngeal exudate.  Cardiovascular: Normal rate, regular rhythm and normal heart sounds. Exam reveals no gallop and no friction rub.  No murmur heard.  Pulmonary/Chest: Effort normal and breath sounds normal. No respiratory distress.  has no wheezes.  Neck = supple, no nuchal rigidity Abdominal: Soft. Bowel sounds are normal.  exhibits no distension. There is no tenderness.  Lymphadenopathy: no cervical adenopathy. No axillary adenopathy Neurological: alert and oriented to person, place, and time.  Skin: Skin is warm and dry. No rash noted. No erythema.  Psychiatric: a  normal mood and affect.  behavior is normal.   Lab Results  Component Value Date   CD4TCELL 35 11/21/2020   Lab Results  Component Value Date   CD4TABS  389 (L) 11/21/2020   CD4TABS 364 (L) 02/22/2020   CD4TABS 370 (L) 11/30/2019   Lab Results  Component Value Date   HIV1RNAQUANT Not Detected 11/21/2020   No results found for: HEPBSAB Lab Results  Component Value Date   LABRPR NON REACTIVE 07/24/2020    CBC Lab Results  Component Value Date   WBC 9.1 07/26/2020   RBC 3.95 07/26/2020   HGB 13.0 07/26/2020   HCT 37.6 07/26/2020   PLT 120 (L) 07/26/2020   MCV 95.2 07/26/2020   MCH 32.9 07/26/2020   MCHC 34.6 07/26/2020   RDW 13.4 07/26/2020   LYMPHSABS 1,072 02/22/2020   MONOABS 384 02/12/2017   EOSABS 40 02/22/2020    BMET Lab Results  Component Value Date   NA 137 07/26/2020   K 3.8 07/26/2020   CL 107 07/26/2020   CO2 22 07/26/2020   GLUCOSE 92 07/26/2020   BUN <5 (L) 07/26/2020   CREATININE 0.40 (L) 07/26/2020   CALCIUM 9.1 07/26/2020   GFRNONAA >60 07/26/2020   GFRAA 163 02/22/2020      Assessment and Plan  Hiv disease= continue on biktarvy; will check labs  Long term medicaiton management= will check cr  Interpreter service excellent with [facility] -#[id number]  RTC 3 months

## 2021-03-22 LAB — HIV-1 RNA QUANT-NO REFLEX-BLD
HIV 1 RNA Quant: NOT DETECTED Copies/mL
HIV-1 RNA Quant, Log: NOT DETECTED Log cps/mL

## 2021-03-22 LAB — T-HELPER CELL (CD4) - (RCID CLINIC ONLY)
CD4 % Helper T Cell: 33 % (ref 33–65)
CD4 T Cell Abs: 407 /uL (ref 400–1790)

## 2021-08-07 ENCOUNTER — Ambulatory Visit (INDEPENDENT_AMBULATORY_CARE_PROVIDER_SITE_OTHER): Payer: Medicaid Other | Admitting: Internal Medicine

## 2021-08-07 ENCOUNTER — Other Ambulatory Visit: Payer: Self-pay

## 2021-08-07 VITALS — BP 108/71 | HR 75 | Temp 97.9°F | Wt 163.0 lb

## 2021-08-07 DIAGNOSIS — Z79899 Other long term (current) drug therapy: Secondary | ICD-10-CM

## 2021-08-07 DIAGNOSIS — Z3009 Encounter for other general counseling and advice on contraception: Secondary | ICD-10-CM | POA: Diagnosis not present

## 2021-08-07 DIAGNOSIS — Z23 Encounter for immunization: Secondary | ICD-10-CM

## 2021-08-07 DIAGNOSIS — Z3042 Encounter for surveillance of injectable contraceptive: Secondary | ICD-10-CM | POA: Diagnosis not present

## 2021-08-07 DIAGNOSIS — B20 Human immunodeficiency virus [HIV] disease: Secondary | ICD-10-CM

## 2021-08-07 DIAGNOSIS — Z3202 Encounter for pregnancy test, result negative: Secondary | ICD-10-CM

## 2021-08-07 LAB — POCT URINE PREGNANCY: Preg Test, Ur: NEGATIVE

## 2021-08-07 MED ORDER — MEDROXYPROGESTERONE ACETATE 150 MG/ML IM SUSP
150.0000 mg | Freq: Once | INTRAMUSCULAR | Status: AC
  Administered 2021-08-07: 150 mg via INTRAMUSCULAR

## 2021-08-07 NOTE — Progress Notes (Signed)
RFV: follow up for hiv disease Patient ID: Mary Zhang, female   DOB: 09-Nov-1990, 31 y.o.   MRN: [id number]  HPI Mary Zhang is a 31yo F with well controlled hiv disease, currently on biktarvy. Using interpreter video line. She reports not having issue with her health.   Wonders if time for depo injection- had it done 4 months. No recent covid illness  Outpatient Encounter Medications as of 08/07/2021  Medication Sig   bictegravir-emtricitabine-tenofovir AF (BIKTARVY) 50-200-25 MG TABS tablet Take 1 tablet by mouth daily.   ondansetron (ZOFRAN ODT) 4 MG disintegrating tablet Take 1 tablet (4 mg total) by mouth every 8 (eight) hours as needed for nausea or vomiting. Take 30 minutes before Biktarvy.   No facility-administered encounter medications on file as of 08/07/2021.     Patient Active Problem List   Diagnosis Date Noted   HIV infection ([place]) 08/26/2020   Tinea 06/28/2020   Language barrier 01/22/2019   Lead exposure 12/16/2015     Health Maintenance Due  Topic Date Due   COVID-19 Vaccine (3 - Pfizer risk series) 07/29/2020   INFLUENZA VACCINE  02/20/2021   Sochx: no smoking nor drinking  Review of Systems Review of Systems  Constitutional: Negative for fever, chills, diaphoresis, activity change, appetite change, fatigue and unexpected weight change.  HENT: Negative for congestion, sore throat, rhinorrhea, sneezing, trouble swallowing and sinus pressure.  Eyes: Negative for photophobia and visual disturbance.  Respiratory: Negative for cough, chest tightness, shortness of breath, wheezing and stridor.  Cardiovascular: Negative for chest pain, palpitations and leg swelling.  Gastrointestinal: Negative for nausea, vomiting, abdominal pain, diarrhea, constipation, blood in stool, abdominal distention and anal bleeding.  Genitourinary: Negative for dysuria, hematuria, flank pain and difficulty urinating.  Musculoskeletal: Negative for myalgias, back pain, joint swelling,  arthralgias and gait problem.  Skin: Negative for color change, pallor, rash and wound.  Neurological: Negative for dizziness, tremors, weakness and light-headedness.  Hematological: Negative for adenopathy. Does not bruise/bleed easily.  Psychiatric/Behavioral: Negative for behavioral problems, confusion, sleep disturbance, dysphoric mood, decreased concentration and agitation.   Physical Exam   BP 108/71    Pulse 75    Temp 97.9 F (36.6 C) (Temporal)    Wt 163 lb (73.9 kg)    SpO2 100%    BMI 29.81 kg/m   Physical Exam  Constitutional:  oriented to person, place, and time. appears well-developed and well-nourished. No distress.  HENT: [place]/AT, PERRLA, no scleral icterus Mouth/Throat: Oropharynx is clear and moist. No oropharyngeal exudate.  Cardiovascular: Normal rate, regular rhythm and normal heart sounds. Exam reveals no gallop and no friction rub.  No murmur heard.  Pulmonary/Chest: Effort normal and breath sounds normal. No respiratory distress.  has no wheezes.  Neck = supple, no nuchal rigidity Abdominal: Soft. Bowel sounds are normal.  exhibits no distension. There is no tenderness.  Lymphadenopathy: no cervical adenopathy. No axillary adenopathy Neurological: alert and oriented to person, place, and time.  Skin: Skin is warm and dry. No rash noted. No erythema.  Psychiatric: a normal mood and affect.  behavior is normal.   Lab Results  Component Value Date   CD4TCELL 33 03/20/2021   Lab Results  Component Value Date   CD4TABS 407 03/20/2021   CD4TABS 389 (L) 11/21/2020   CD4TABS 364 (L) 02/22/2020   Lab Results  Component Value Date   HIV1RNAQUANT Not Detected 03/20/2021   No results found for: HEPBSAB Lab Results  Component Value Date   LABRPR NON REACTIVE 07/24/2020  CBC Lab Results  Component Value Date   WBC 9.1 07/26/2020   RBC 3.95 07/26/2020   HGB 13.0 07/26/2020   HCT 37.6 07/26/2020   PLT 120 (L) 07/26/2020   MCV 95.2 07/26/2020   MCH 32.9  07/26/2020   MCHC 34.6 07/26/2020   RDW 13.4 07/26/2020   LYMPHSABS 1,072 02/22/2020   MONOABS 384 02/12/2017   EOSABS 40 02/22/2020    BMET Lab Results  Component Value Date   NA 137 07/26/2020   K 3.8 07/26/2020   CL 107 07/26/2020   CO2 22 07/26/2020   GLUCOSE 92 07/26/2020   BUN <5 (L) 07/26/2020   CREATININE 0.40 (L) 07/26/2020   CALCIUM 9.1 07/26/2020   GFRNONAA >60 07/26/2020   GFRAA 163 02/22/2020      Assessment and Plan HIV disease= we will do Hiv labs, refills Health maintenance = today, we will do Flu shot Birth control = will give depo, do urine pregnancy test to ensure not pregnant Rtc in 3 months to keep on track with depo injections

## 2021-08-08 LAB — T-HELPER CELL (CD4) - (RCID CLINIC ONLY)
CD4 % Helper T Cell: 33 % (ref 33–65)
CD4 T Cell Abs: 411 /uL (ref 400–1790)

## 2021-08-09 LAB — HIV-1 RNA QUANT-NO REFLEX-BLD
HIV 1 RNA Quant: NOT DETECTED Copies/mL
HIV-1 RNA Quant, Log: NOT DETECTED Log cps/mL

## 2021-08-16 ENCOUNTER — Other Ambulatory Visit: Payer: Self-pay | Admitting: Internal Medicine

## 2021-08-17 ENCOUNTER — Telehealth: Payer: Self-pay

## 2021-08-17 NOTE — Telephone Encounter (Signed)
Called patient to get an appointment scheduled with Dr. Drue Second around 4/16, left voicemail for patient to call us back to get scheduled.

## 2021-09-18 ENCOUNTER — Other Ambulatory Visit (HOSPITAL_COMMUNITY): Payer: Self-pay

## 2021-10-24 ENCOUNTER — Ambulatory Visit: Payer: Medicaid Other | Admitting: Internal Medicine

## 2021-11-09 ENCOUNTER — Ambulatory Visit: Payer: Medicaid Other | Admitting: Internal Medicine

## 2022-02-14 ENCOUNTER — Telehealth: Payer: Self-pay

## 2022-02-14 NOTE — Telephone Encounter (Signed)
Received call from patient's husband requesting to schedule appointment with Dr. Drue Second to discuss medication change. States patient took an at-home pregnancy test that was positive. Scheduled for tomorrow 7/27.  Sandie Ano, RN

## 2022-02-15 ENCOUNTER — Ambulatory Visit (INDEPENDENT_AMBULATORY_CARE_PROVIDER_SITE_OTHER): Payer: Medicaid Other | Admitting: Internal Medicine

## 2022-02-15 ENCOUNTER — Encounter: Payer: Self-pay | Admitting: Internal Medicine

## 2022-02-15 ENCOUNTER — Other Ambulatory Visit: Payer: Self-pay

## 2022-02-15 ENCOUNTER — Other Ambulatory Visit (HOSPITAL_COMMUNITY)
Admission: RE | Admit: 2022-02-15 | Discharge: 2022-02-15 | Disposition: A | Discharge status: Home / Self Care | Payer: Medicaid Other | Source: Ambulatory Visit | Attending: Internal Medicine | Admitting: Internal Medicine

## 2022-02-15 VITALS — BP 110/71 | HR 88 | Temp 98.2°F | Wt 163.0 lb

## 2022-02-15 DIAGNOSIS — Z79899 Other long term (current) drug therapy: Secondary | ICD-10-CM | POA: Diagnosis not present

## 2022-02-15 DIAGNOSIS — B20 Human immunodeficiency virus [HIV] disease: Secondary | ICD-10-CM | POA: Diagnosis present

## 2022-02-15 DIAGNOSIS — Z3A01 Less than 8 weeks gestation of pregnancy: Secondary | ICD-10-CM | POA: Diagnosis not present

## 2022-02-15 LAB — POCT URINE PREGNANCY: Preg Test, Ur: POSITIVE — AB

## 2022-02-15 MED ORDER — TRIUMEQ 600-50-300 MG PO TABS: 1.0000 | ORAL_TABLET | Freq: Every day | ORAL | 11 refills | Status: DC

## 2022-02-15 MED ORDER — PRENATAL VITAMIN AND MINERAL 28-0.8 MG PO TABS
1.0000 | ORAL_TABLET | Freq: Every day | ORAL | 11 refills | Status: DC
Start: 2022-02-15 — End: 2022-11-15

## 2022-02-15 NOTE — Progress Notes (Signed)
RFV: follow up for HIV disease  Patient ID: Mary Zhang, female   DOB: Oct 09, 1990, 31 y.o.   MRN: [id number]  HPI Eulamae is a 31yo F with well controlled hiv disease, CD 4 count of 411/VL< 20 in jan 2023. In good health. Has not missed any doses of biktarvy. She reports her kids are doing well  oldest 53 yo daughter, son 7 yo. And baby 1 yo 7 month. She reports that she stopped depo and trying to get pregnant; the patient reports She is late for her period, it was supposed to have period 11 days ago. She is hoping to  about being pregnant. She took a home test and it was positive ( 2 lines)  She is not on any prenatal ;Last menstrual period 18th of June.No nausea and vomiting but breast tenderness  Outpatient Encounter Medications as of 02/15/2022  Medication Sig   BIKTARVY 50-200-25 MG TABS tablet TAKE 1 TABLET BY MOUTH DAILY   ondansetron (ZOFRAN ODT) 4 MG disintegrating tablet Take 1 tablet (4 mg total) by mouth every 8 (eight) hours as needed for nausea or vomiting. Take 30 minutes before Biktarvy.   No facility-administered encounter medications on file as of 02/15/2022.     Patient Active Problem List   Diagnosis Date Noted   HIV infection (HCC) 08/26/2020   Tinea 06/28/2020   Language barrier 01/22/2019   Lead exposure 12/16/2015     Health Maintenance Due  Topic Date Due   COVID-19 Vaccine (3 - Pfizer risk series) 07/29/2020   PAP-Cervical Cytology Screening  01/21/2022   PAP SMEAR-Modifier  01/21/2022     Review of Systems Review of Systems  Constitutional: Negative for fever, chills, diaphoresis, activity change, appetite change, fatigue and unexpected weight change.  HENT: Negative for congestion, sore throat, rhinorrhea, sneezing, trouble swallowing and sinus pressure.  Eyes: Negative for photophobia and visual disturbance.  Respiratory: Negative for cough, chest tightness, shortness of breath, wheezing and stridor.  Cardiovascular: Negative for chest pain,  palpitations and leg swelling.  Gastrointestinal: Negative for nausea, vomiting, abdominal pain, diarrhea, constipation, blood in stool, abdominal distention and anal bleeding.  Genitourinary: Negative for dysuria, hematuria, flank pain and difficulty urinating.  Musculoskeletal: Negative for myalgias, back pain, joint swelling, arthralgias and gait problem.  Skin: Negative for color change, pallor, rash and wound.  Neurological: Negative for dizziness, tremors, weakness and light-headedness.  Hematological: Negative for adenopathy. Does not bruise/bleed easily.  Psychiatric/Behavioral: Negative for behavioral problems, confusion, sleep disturbance, dysphoric mood, decreased concentration and agitation.   Physical Exam   BP 110/71   Pulse 88   Temp 98.2 F (36.8 C) (Oral)   Wt 163 lb (73.9 kg)   BMI 29.81 kg/m   Physical Exam  Constitutional:  oriented to person, place, and time. appears well-developed and well-nourished. No distress.  HENT: [place]/AT, PERRLA, no scleral icterus Mouth/Throat: Oropharynx is clear and moist. No oropharyngeal exudate.  Cardiovascular: Normal rate, regular rhythm and normal heart sounds. Exam reveals no gallop and no friction rub.  No murmur heard.  Pulmonary/Chest: Effort normal and breath sounds normal. No respiratory distress.  has no wheezes.  Neck = supple, no nuchal rigidity Abdominal: Soft. Bowel sounds are normal.  exhibits no distension. There is no tenderness.  Lymphadenopathy: no cervical adenopathy. No axillary adenopathy Neurological: alert and oriented to person, place, and time.  Skin: Skin is warm and dry. No rash noted. No erythema.  Psychiatric: a normal mood and affect.  behavior is normal.   Lab  Results  Component Value Date   CD4TCELL 33 08/07/2021   Lab Results  Component Value Date   CD4TABS 411 08/07/2021   CD4TABS 407 03/20/2021   CD4TABS 389 (L) 11/21/2020   Lab Results  Component Value Date   HIV1RNAQUANT Not Detected  08/07/2021   No results found for: "HEPBSAB" Lab Results  Component Value Date   LABRPR NON REACTIVE 07/24/2020    CBC Lab Results  Component Value Date   WBC 9.1 07/26/2020   RBC 3.95 07/26/2020   HGB 13.0 07/26/2020   HCT 37.6 07/26/2020   PLT 120 (L) 07/26/2020   MCV 95.2 07/26/2020   MCH 32.9 07/26/2020   MCHC 34.6 07/26/2020   RDW 13.4 07/26/2020   LYMPHSABS 1,072 02/22/2020   MONOABS 384 02/12/2017   EOSABS 40 02/22/2020    BMET Lab Results  Component Value Date   NA 137 07/26/2020   K 3.8 07/26/2020   CL 107 07/26/2020   CO2 22 07/26/2020   GLUCOSE 92 07/26/2020   BUN <5 (L) 07/26/2020   CREATININE 0.40 (L) 07/26/2020   CALCIUM 9.1 07/26/2020   GFRNONAA >60 07/26/2020   GFRAA 163 02/22/2020      Assessment and Plan  Hiv disease = will switch to triumeq for which she tolerated in her previous pregnancy  Long term medication management= cr is stable  Confirmation of pregnancy = will check for pregnancy with ur + and blood hcg quant. Will start Pre-natal vitamins and refer to ob/gyn

## 2022-02-16 LAB — URINE CYTOLOGY ANCILLARY ONLY
Chlamydia: NEGATIVE
Comment: NEGATIVE
Comment: NORMAL
Neisseria Gonorrhea: NEGATIVE

## 2022-02-16 LAB — T-HELPER CELL (CD4) - (RCID CLINIC ONLY)
CD4 % Helper T Cell: 34 % (ref 33–65)
CD4 T Cell Abs: 420 /uL (ref 400–1790)

## 2022-02-19 ENCOUNTER — Encounter: Payer: Self-pay | Admitting: General Practice

## 2022-02-19 LAB — CBC WITH DIFFERENTIAL/PLATELET
Absolute Monocytes: 371 cells/uL (ref 200–950)
Basophils Absolute: 13 cells/uL (ref 0–200)
Basophils Relative: 0.2 %
Eosinophils Absolute: 38 cells/uL (ref 15–500)
Eosinophils Relative: 0.6 %
HCT: 41.5 % (ref 35.0–45.0)
Hemoglobin: 14.3 g/dL (ref 11.7–15.5)
Lymphs Abs: 1222 cells/uL (ref 850–3900)
MCH: 31.4 pg (ref 27.0–33.0)
MCHC: 34.5 g/dL (ref 32.0–36.0)
MCV: 91.2 fL (ref 80.0–100.0)
MPV: 10.5 fL (ref 7.5–12.5)
Monocytes Relative: 5.8 %
Neutro Abs: 4755 cells/uL (ref 1500–7800)
Neutrophils Relative %: 74.3 %
Platelets: 221 10*3/uL (ref 140–400)
RBC: 4.55 10*6/uL (ref 3.80–5.10)
RDW: 13.1 % (ref 11.0–15.0)
Total Lymphocyte: 19.1 %
WBC: 6.4 10*3/uL (ref 3.8–10.8)

## 2022-02-19 LAB — COMPLETE METABOLIC PANEL WITH GFR
AG Ratio: 1.7 (calc) (ref 1.0–2.5)
ALT: 32 U/L — ABNORMAL HIGH (ref 6–29)
AST: 21 U/L (ref 10–30)
Albumin: 4.5 g/dL (ref 3.6–5.1)
Alkaline phosphatase (APISO): 77 U/L (ref 31–125)
BUN/Creatinine Ratio: 13 (calc) (ref 6–22)
BUN: 6 mg/dL — ABNORMAL LOW (ref 7–25)
CO2: 24 mmol/L (ref 20–32)
Calcium: 9.5 mg/dL (ref 8.6–10.2)
Chloride: 105 mmol/L (ref 98–110)
Creat: 0.46 mg/dL — ABNORMAL LOW (ref 0.50–0.96)
Globulin: 2.7 g/dL (calc) (ref 1.9–3.7)
Glucose, Bld: 97 mg/dL (ref 65–99)
Potassium: 3.5 mmol/L (ref 3.5–5.3)
Sodium: 138 mmol/L (ref 135–146)
Total Bilirubin: 0.8 mg/dL (ref 0.2–1.2)
Total Protein: 7.2 g/dL (ref 6.1–8.1)
eGFR: 133 mL/min/{1.73_m2} (ref >=60)

## 2022-02-19 LAB — HIV-1 RNA QUANT-NO REFLEX-BLD
HIV 1 RNA Quant: NOT DETECTED Copies/mL
HIV-1 RNA Quant, Log: NOT DETECTED Log cps/mL

## 2022-02-19 LAB — RPR: RPR Ser Ql: NONREACTIVE

## 2022-02-19 LAB — HCG, QUANTITATIVE, PREGNANCY: HCG, Total, QN: 22501 m[IU]/mL

## 2022-03-28 ENCOUNTER — Encounter: Payer: Self-pay | Admitting: General Practice

## 2022-03-28 ENCOUNTER — Ambulatory Visit (INDEPENDENT_AMBULATORY_CARE_PROVIDER_SITE_OTHER): Payer: Medicaid Other | Admitting: Family Medicine

## 2022-03-28 ENCOUNTER — Encounter: Payer: Self-pay | Admitting: Family Medicine

## 2022-03-28 ENCOUNTER — Other Ambulatory Visit (HOSPITAL_COMMUNITY)
Admission: RE | Admit: 2022-03-28 | Discharge: 2022-03-28 | Disposition: A | Discharge status: Home / Self Care | Payer: Medicaid Other | Source: Ambulatory Visit | Attending: Family Medicine | Admitting: Family Medicine

## 2022-03-28 VITALS — BP 101/61 | HR 87 | Wt 163.0 lb

## 2022-03-28 DIAGNOSIS — O09899 Supervision of other high risk pregnancies, unspecified trimester: Secondary | ICD-10-CM | POA: Diagnosis present

## 2022-03-28 DIAGNOSIS — Z3A11 11 weeks gestation of pregnancy: Secondary | ICD-10-CM

## 2022-03-28 DIAGNOSIS — O09891 Supervision of other high risk pregnancies, first trimester: Secondary | ICD-10-CM

## 2022-03-28 DIAGNOSIS — Z21 Asymptomatic human immunodeficiency virus [HIV] infection status: Secondary | ICD-10-CM

## 2022-03-28 DIAGNOSIS — Z789 Other specified health status: Secondary | ICD-10-CM

## 2022-03-28 DIAGNOSIS — D696 Thrombocytopenia, unspecified: Secondary | ICD-10-CM

## 2022-03-28 DIAGNOSIS — O99119 Other diseases of the blood and blood-forming organs and certain disorders involving the immune mechanism complicating pregnancy, unspecified trimester: Secondary | ICD-10-CM

## 2022-03-28 DIAGNOSIS — Z8751 Personal history of pre-term labor: Secondary | ICD-10-CM

## 2022-03-28 NOTE — Progress Notes (Signed)
Subjective:  Mary Zhang is a [id number] [redacted]w[redacted]d being seen today for her first obstetrical visit.  Her obstetrical history is significant for  3 prior vaginal deliveries.  Her first baby was born at 83 weeks with the other 2 born at full-term.  This is an attended and wanted pregnancy.  She does have medical complications of HIV.  She is on antiviral medications and has undetectable viral loads.  This is her fourth pregnancy with HIV. Patient does not intend to breast feed. Pregnancy history fully reviewed.  Patient reports no complaints.  BP 101/61   Pulse 87   Wt 163 lb (73.9 kg)   LMP 01/07/2022   BMI 29.81 kg/m   HISTORY: OB History  Gravida Para Term Preterm AB Living  4 3 2 1   3   SAB IAB Ectopic Multiple Live Births        0 3    # Outcome Date GA Lbr Len/2nd Weight Sex Delivery Anes PTL Lv  4 Current           3 Term 07/24/20 [redacted]w[redacted]d 00:20 7 lb 14.5 oz (3.585 kg) M Vag-Spont None  LIV     Birth Comments: none  2 Term 07/09/19 [redacted]w[redacted]d / 00:06 7 lb 11.6 oz (3.505 kg) M Vag-Spont None  LIV     Birth Comments: wnl  1 Preterm 06/09/16 [redacted]w[redacted]d  5 lb 12.4 oz (2.62 kg)  Vag-Spont EPI Y LIV     Complications: Preterm premature rupture of membranes    Past Medical History:  Diagnosis Date   Gestational thrombocytopenia (HCC)    HIV disease (HCC) 09/30/2015   HIV infection (HCC) 08/26/2020   Low blood pressure, not hypotension 01/02/2016   Menorrhagia    Raised antibody titer 10/25/2015   High toxoplasma gondi antibody titer   Shingles     Past Surgical History:  Procedure Laterality Date   NO PAST SURGERIES      Family History  Problem Relation Age of Onset   Hypertension Neg Hx    Diabetes Neg Hx      Exam  BP 101/61   Pulse 87   Wt 163 lb (73.9 kg)   LMP 01/07/2022   BMI 29.81 kg/m   Chaperone present during exam  CONSTITUTIONAL: Well-developed, well-nourished female in no acute distress.  HENT:  Normocephalic, atraumatic, External right and left ear normal.  Oropharynx is clear and moist EYES: Conjunctivae and EOM are normal. Pupils are equal, round, and reactive to light. No scleral icterus.  NECK: Normal range of motion, supple, no masses.  Normal thyroid.  CARDIOVASCULAR: Normal heart rate noted, regular rhythm RESPIRATORY: Clear to auscultation bilaterally. Effort and breath sounds normal, no problems with respiration noted. BREASTS: Symmetric in size. No masses, skin changes, nipple drainage, or lymphadenopathy. ABDOMEN: Soft, normal bowel sounds, no distention noted.  No tenderness, rebound or guarding.  PELVIC: Normal appearing external genitalia; normal appearing vaginal mucosa and cervix. No abnormal discharge noted. Normal uterine size, no other palpable masses, no uterine or adnexal tenderness. MUSCULOSKELETAL: Normal range of motion. No tenderness.  No cyanosis, clubbing, or edema.  2+ distal pulses. SKIN: Skin is warm and dry. No rash noted. Not diaphoretic. No erythema. No pallor. NEUROLOGIC: Alert and oriented to person, place, and time. Normal reflexes, muscle tone coordination. No cranial nerve deficit noted. PSYCHIATRIC: Normal mood and affect. Normal behavior. Normal judgment and thought content.    Assessment:    Pregnancy: 01/09/2022 Patient Active Problem List   Diagnosis Date Noted  Supervision of other high risk pregnancy, antepartum 03/28/2022   HIV infection (HCC) 08/26/2020   Tinea 06/28/2020   Language barrier 01/22/2019   Lead exposure 12/16/2015      Plan:   1. Supervision of other high risk pregnancy, antepartum FHT and FH normal - Cytology - PAP( [facility]) - CBC/D/Plt+RPR+Rh+ABO+RubIgG... - [place] MFM OB DETAIL +14 WK; Future - Culture, OB Urine - Hemoglobin A1c - Panorama Prenatal Test Full Panel  2. Asymptomatic HIV infection, with no history of HIV-related illness (HCC) Continue with antiviral treatment - Cytology - PAP( [facility]) - CBC/D/Plt+RPR+Rh+ABO+RubIgG... - [place] MFM OB DETAIL +14 WK;  Future - Culture, OB Urine - Hemoglobin A1c - Panorama Prenatal Test Full Panel  3. Language barrier Interpreter used.  4. Benign gestational thrombocytopenia, antepartum (HCC) Had in prior pregnancies. CBC today.  5. History of preterm delivery  Initial labs obtained Continue prenatal vitamins Reviewed n/v relief measures and warning s/s to report Reviewed recommended weight gain based on pre-gravid BMI Encouraged well-balanced diet Genetic & carrier screening discussed: requests Panorama,  Ultrasound discussed; fetal survey: requested CCNC completed> form faxed if has or is planning to apply for medicaid The nature of [facility] [employer] with multiple MDs and other Advanced Practice Providers was explained to patient; also emphasized that fellows, residents, and students are part of our team.   Problem list reviewed and updated. 75% of 30 min visit spent on counseling and coordination of care.     Levie Heritage 03/28/2022

## 2022-03-28 NOTE — Progress Notes (Signed)
[employer] interpreter  [place] [phone].

## 2022-03-29 LAB — CBC/D/PLT+RPR+RH+ABO+RUBIGG...
Antibody Screen: NEGATIVE
Basophils Absolute: 0 10*3/uL (ref 0.0–0.2)
Basos: 0 %
EOS (ABSOLUTE): 0 10*3/uL (ref 0.0–0.4)
Eos: 1 %
HCV Ab: NONREACTIVE
HIV Screen 4th Generation wRfx: REACTIVE
Hematocrit: 38.3 % (ref 34.0–46.6)
Hemoglobin: 13.3 g/dL (ref 11.1–15.9)
Hepatitis B Surface Ag: NEGATIVE
Immature Grans (Abs): 0.1 10*3/uL (ref 0.0–0.1)
Immature Granulocytes: 1 %
Lymphocytes Absolute: 1 10*3/uL (ref 0.7–3.1)
Lymphs: 18 %
MCH: 31.4 pg (ref 26.6–33.0)
MCHC: 34.7 g/dL (ref 31.5–35.7)
MCV: 90 fL (ref 79–97)
Monocytes Absolute: 0.3 10*3/uL (ref 0.1–0.9)
Monocytes: 5 %
Neutrophils Absolute: 4 10*3/uL (ref 1.4–7.0)
Neutrophils: 75 %
Platelets: 157 10*3/uL (ref 150–450)
RBC: 4.24 x10E6/uL (ref 3.77–5.28)
RDW: 13 % (ref 11.7–15.4)
RPR Ser Ql: NONREACTIVE
Rh Factor: POSITIVE
Rubella Antibodies, IGG: 2.46 index (ref >0.99)
WBC: 5.4 10*3/uL (ref 3.4–10.8)

## 2022-03-29 LAB — HCV INTERPRETATION

## 2022-03-29 LAB — HEMOGLOBIN A1C
Est. average glucose Bld gHb Est-mCnc: 97 mg/dL
Hgb A1c MFr Bld: 5 % (ref 4.8–5.6)

## 2022-03-29 LAB — HIV 1/2 AB DIFFERENTIATION
HIV 1 Ab: REACTIVE
HIV 2 Ab: NONREACTIVE
NOTE (HIV CONF MULTIP: POSITIVE — AB

## 2022-03-30 LAB — CULTURE, OB URINE

## 2022-03-30 LAB — CYTOLOGY - PAP
Chlamydia: NEGATIVE
Comment: NEGATIVE
Comment: NORMAL
Diagnosis: NEGATIVE
Neisseria Gonorrhea: NEGATIVE

## 2022-03-30 LAB — URINE CULTURE, OB REFLEX

## 2022-04-02 LAB — PANORAMA PRENATAL TEST FULL PANEL:PANORAMA TEST PLUS 5 ADDITIONAL MICRODELETIONS: FETAL FRACTION: 8.2

## 2022-04-25 ENCOUNTER — Ambulatory Visit (INDEPENDENT_AMBULATORY_CARE_PROVIDER_SITE_OTHER): Payer: Medicaid Other | Admitting: Family Medicine

## 2022-04-25 ENCOUNTER — Encounter: Payer: Self-pay | Admitting: General Practice

## 2022-04-25 VITALS — BP 100/58 | HR 88 | Wt 161.0 lb

## 2022-04-25 DIAGNOSIS — Z789 Other specified health status: Secondary | ICD-10-CM

## 2022-04-25 DIAGNOSIS — O468X1 Other antepartum hemorrhage, first trimester: Secondary | ICD-10-CM

## 2022-04-25 DIAGNOSIS — O418X10 Other specified disorders of amniotic fluid and membranes, first trimester, not applicable or unspecified: Secondary | ICD-10-CM

## 2022-04-25 DIAGNOSIS — Z21 Asymptomatic human immunodeficiency virus [HIV] infection status: Secondary | ICD-10-CM

## 2022-04-25 DIAGNOSIS — O09899 Supervision of other high risk pregnancies, unspecified trimester: Secondary | ICD-10-CM

## 2022-04-25 DIAGNOSIS — O99119 Other diseases of the blood and blood-forming organs and certain disorders involving the immune mechanism complicating pregnancy, unspecified trimester: Secondary | ICD-10-CM

## 2022-04-25 DIAGNOSIS — Z8751 Personal history of pre-term labor: Secondary | ICD-10-CM

## 2022-04-25 DIAGNOSIS — D696 Thrombocytopenia, unspecified: Secondary | ICD-10-CM

## 2022-04-25 NOTE — Progress Notes (Signed)
   PRENATAL VISIT NOTE  Subjective:  Mary Zhang is a 31 y.o. [id number] at [redacted]w[redacted]d being seen today for ongoing prenatal care.  She is currently monitored for the following issues for this high-risk pregnancy and has Lead exposure; Language barrier; Benign gestational thrombocytopenia, antepartum ([place]); History of preterm delivery; Tinea; HIV infection ([place]); and Supervision of other high risk pregnancy, antepartum on their problem list.  Patient reports  went to the ER [facility] for vaginal bleeding that started yesterday.  Bleeding was fairly significant and lasted 3 to 4 hours.  It is now fairly light, but she is still having some bleeding.   Contractions: Not present. Vag. Bleeding: None, Small.  Movement: Absent. Denies leaking of fluid.   The following portions of the patient's history were reviewed and updated as appropriate: allergies, current medications, past family history, past medical history, past social history, past surgical history and problem list.   Objective:   Vitals:   04/25/22 0938  BP: (!) 100/58  Pulse: 88  Weight: 161 lb (73 kg)    Fetal Status: Fetal Heart Rate (bpm): 155 Fundal Height: 15 cm Movement: Absent     General:  Alert, oriented and cooperative. Patient is in no acute distress.  Skin: Skin is warm and dry. No rash noted.   Cardiovascular: Normal heart rate noted  Respiratory: Normal respiratory effort, no problems with respiration noted  Abdomen: Soft, gravid, appropriate for gestational age.  Pain/Pressure: Absent     Pelvic: Cervical exam deferred        Extremities: Normal range of motion.  Edema: None  Mental Status: Normal mood and affect. Normal behavior. Normal judgment and thought content.   Assessment and Plan:  Pregnancy: [id number] at [redacted]w[redacted]d 1. Supervision of other high risk pregnancy, antepartum FHT and FH normal  2. Asymptomatic HIV infection, with no history of HIV-related illness ([place]) On antivirals - tolerating medication.  Undetectable viral load. Next appointment with ID on 10/26  3. Benign gestational thrombocytopenia, antepartum (HCC) Platelets normal  4. Language barrier Interpreter used.  5. History of preterm delivery   6. Subchorionic hematoma in first trimester, single or unspecified fetus Bedside ultrasound done showing small subchorionic hematoma.  Recommended pelvic rest for the next couple of weeks to allow this to heal up.  Encourage patient to follow-up if bleeding increases.  Preterm labor symptoms and general obstetric precautions including but not limited to vaginal bleeding, contractions, leaking of fluid and fetal movement were reviewed in detail with the patient. Please refer to After Visit Summary for other counseling recommendations.   No follow-ups on file.  Future Appointments  Date Time Provider [place]  05/17/2022  2:45 PM Carlyle Basques, MD [facility]  05/21/2022  1:15 PM [facility] NURSE [facility]  05/21/2022  1:30 PM [facility] US3 [facility]  05/24/2022  2:10 PM Truett Mainland, DO [facility] None  06/21/2022  9:55 AM Truett Mainland, DO [facility] None    Truett Mainland, DO

## 2022-04-25 NOTE — Progress Notes (Signed)
Went to ER last night for vaginal bleeding- went to [place] RN

## 2022-05-17 ENCOUNTER — Other Ambulatory Visit: Payer: Self-pay

## 2022-05-17 ENCOUNTER — Ambulatory Visit (INDEPENDENT_AMBULATORY_CARE_PROVIDER_SITE_OTHER): Payer: Medicaid Other | Admitting: Internal Medicine

## 2022-05-17 ENCOUNTER — Encounter: Payer: Self-pay | Admitting: Internal Medicine

## 2022-05-17 VITALS — BP 96/64 | HR 96 | Temp 97.4°F | Resp 100 | Wt 166.0 lb

## 2022-05-17 DIAGNOSIS — Z23 Encounter for immunization: Secondary | ICD-10-CM

## 2022-05-17 DIAGNOSIS — B20 Human immunodeficiency virus [HIV] disease: Secondary | ICD-10-CM

## 2022-05-17 NOTE — Progress Notes (Signed)
Patient ID: Mary Zhang, female   DOB: 1990-12-05, 31 y.o.   MRN: [id number]  HPI 31yo F with well controlled hiv disease, CD 4 count of 420/VL<20 (July 2023) on triumeq. Also she is at 18 wk 1 d gestation. She went to the ED on 10/4 for vaginal bleeding and she was diagnosed with subchorionic hematoma in the first trimerster. She was recommended to get pelvic rest for 2-3 wk. She reports today is first time bleeding has stopped.   The due date is in march 24-26th.   She has appt on 10/30 with women's clinic that had to be rescheduled to 11/02. Because she has a WIC appt on 10/30 to attend.    ROS: no vaginal bleeding. Occ has nausea but eating well. Outpatient Encounter Medications as of 05/17/2022  Medication Sig   abacavir-dolutegravir-lamiVUDine (TRIUMEQ) 600-50-300 MG tablet Take 1 tablet by mouth daily.   Prenatal Vit-Fe Fumarate-FA (PRENATAL VITAMIN AND MINERAL) 28-0.8 MG TABS Take 1 tablet by mouth daily.   ondansetron (ZOFRAN ODT) 4 MG disintegrating tablet Take 1 tablet (4 mg total) by mouth every 8 (eight) hours as needed for nausea or vomiting. Take 30 minutes before Biktarvy. (Patient not taking: Reported on 03/28/2022)   No facility-administered encounter medications on file as of 05/17/2022.     Patient Active Problem List   Diagnosis Date Noted   Supervision of other high risk pregnancy, antepartum 03/28/2022   HIV infection ([place]) 08/26/2020   Tinea 06/28/2020   History of preterm delivery 01/05/2020   Language barrier 01/22/2019   Benign gestational thrombocytopenia, antepartum ([place]) 01/22/2019   Lead exposure 12/16/2015     Health Maintenance Due  Topic Date Due   COVID-19 Vaccine (3 - Pfizer risk series) 07/29/2020   INFLUENZA VACCINE  02/20/2022     Review of Systems Review of Systems  Constitutional: Negative for fever, chills, diaphoresis, activity change, appetite change, fatigue and unexpected weight change.  HENT: Negative for congestion, sore  throat, rhinorrhea, sneezing, trouble swallowing and sinus pressure.  Eyes: Negative for photophobia and visual disturbance.  Respiratory: Negative for cough, chest tightness, shortness of breath, wheezing and stridor.  Cardiovascular: Negative for chest pain, palpitations and leg swelling.  Gastrointestinal: Negative for nausea, vomiting, abdominal pain, diarrhea, constipation, blood in stool, abdominal distention and anal bleeding.  Genitourinary: Negative for dysuria, hematuria, flank pain and difficulty urinating.  Musculoskeletal: Negative for myalgias, back pain, joint swelling, arthralgias and gait problem.  Skin: Negative for color change, pallor, rash and wound.  Neurological: Negative for dizziness, tremors, weakness and light-headedness.  Hematological: Negative for adenopathy. Does not bruise/bleed easily.  Psychiatric/Behavioral: Negative for behavioral problems, confusion, sleep disturbance, dysphoric mood, decreased concentration and agitation.   Physical Exam   BP 96/64   Pulse 96   Temp (!) 97.4 F (36.3 C) (Temporal)   Resp (!) 100   Wt 166 lb (75.3 kg)   LMP 01/07/2022   BMI 30.36 kg/m   Physical Exam  Constitutional:  oriented to person, place, and time. appears well-developed and well-nourished. No distress.  HENT: [place]/AT, PERRLA, no scleral icterus Mouth/Throat: Oropharynx is clear and moist. No oropharyngeal exudate.  Cardiovascular: Normal rate, regular rhythm and normal heart sounds. Exam reveals no gallop and no friction rub.  No murmur heard.  Pulmonary/Chest: Effort normal and breath sounds normal. No respiratory distress.  has no wheezes.  Neck = supple, no nuchal rigidity Abdominal: Soft. Bowel sounds are normal.  exhibits no distension. There is no tenderness.  Lymphadenopathy: no cervical adenopathy. No axillary adenopathy Neurological: alert and oriented to person, place, and time.  Skin: Skin is warm and dry. No rash noted. No erythema.   Psychiatric: a normal mood and affect.  behavior is normal.   Lab Results  Component Value Date   CD4TCELL 34 02/15/2022   Lab Results  Component Value Date   CD4TABS 420 02/15/2022   CD4TABS 411 08/07/2021   CD4TABS 407 03/20/2021   Lab Results  Component Value Date   HIV1RNAQUANT Not Detected 02/15/2022   No results found for: "HEPBSAB" Lab Results  Component Value Date   LABRPR Non Reactive 03/28/2022    CBC Lab Results  Component Value Date   WBC 5.4 03/28/2022   RBC 4.24 03/28/2022   HGB 13.3 03/28/2022   HCT 38.3 03/28/2022   PLT 157 03/28/2022   MCV 90 03/28/2022   MCH 31.4 03/28/2022   MCHC 34.7 03/28/2022   RDW 13.0 03/28/2022   LYMPHSABS 1.0 03/28/2022   MONOABS 384 02/12/2017   EOSABS 0.0 03/28/2022    BMET Lab Results  Component Value Date   NA 138 02/15/2022   K 3.5 02/15/2022   CL 105 02/15/2022   CO2 24 02/15/2022   GLUCOSE 97 02/15/2022   BUN 6 (L) 02/15/2022   CREATININE 0.46 (L) 02/15/2022   CALCIUM 9.5 02/15/2022   GFRNONAA >60 07/26/2020   GFRAA 163 02/22/2020      Assessment and Plan  Hiv disease = anticipate that it is still well controlled. Will check cd 4 count and hiv viral load  Vaginal bleeding = will check cbc  Will give flu vaccine today  3 months visit

## 2022-05-18 LAB — T-HELPER CELL (CD4) - (RCID CLINIC ONLY)
CD4 % Helper T Cell: 34 % (ref 33–65)
CD4 T Cell Abs: 337 /uL — ABNORMAL LOW (ref 400–1790)

## 2022-05-19 LAB — CBC WITH DIFFERENTIAL/PLATELET
Absolute Monocytes: 326 cells/uL (ref 200–950)
Basophils Absolute: 7 cells/uL (ref 0–200)
Basophils Relative: 0.1 %
Eosinophils Absolute: 37 cells/uL (ref 15–500)
Eosinophils Relative: 0.5 %
HCT: 33.5 % — ABNORMAL LOW (ref 35.0–45.0)
Hemoglobin: 11.7 g/dL (ref 11.7–15.5)
Lymphs Abs: 1043 cells/uL (ref 850–3900)
MCH: 32.4 pg (ref 27.0–33.0)
MCHC: 34.9 g/dL (ref 32.0–36.0)
MCV: 92.8 fL (ref 80.0–100.0)
MPV: 11.3 fL (ref 7.5–12.5)
Monocytes Relative: 4.4 %
Neutro Abs: 5987 cells/uL (ref 1500–7800)
Neutrophils Relative %: 80.9 %
Platelets: 140 10*3/uL (ref 140–400)
RBC: 3.61 10*6/uL — ABNORMAL LOW (ref 3.80–5.10)
RDW: 13.5 % (ref 11.0–15.0)
Total Lymphocyte: 14.1 %
WBC: 7.4 10*3/uL (ref 3.8–10.8)

## 2022-05-19 LAB — HIV-1 RNA QUANT-NO REFLEX-BLD
HIV 1 RNA Quant: NOT DETECTED Copies/mL
HIV-1 RNA Quant, Log: NOT DETECTED Log cps/mL

## 2022-05-21 ENCOUNTER — Ambulatory Visit: Payer: Medicaid Other

## 2022-05-24 ENCOUNTER — Ambulatory Visit (INDEPENDENT_AMBULATORY_CARE_PROVIDER_SITE_OTHER): Payer: Medicaid Other | Admitting: Family Medicine

## 2022-05-24 VITALS — BP 100/68 | HR 87 | Wt 163.0 lb

## 2022-05-24 DIAGNOSIS — Z8751 Personal history of pre-term labor: Secondary | ICD-10-CM

## 2022-05-24 DIAGNOSIS — D696 Thrombocytopenia, unspecified: Secondary | ICD-10-CM

## 2022-05-24 DIAGNOSIS — Z3A19 19 weeks gestation of pregnancy: Secondary | ICD-10-CM

## 2022-05-24 DIAGNOSIS — O99119 Other diseases of the blood and blood-forming organs and certain disorders involving the immune mechanism complicating pregnancy, unspecified trimester: Secondary | ICD-10-CM

## 2022-05-24 DIAGNOSIS — O09899 Supervision of other high risk pregnancies, unspecified trimester: Secondary | ICD-10-CM

## 2022-05-24 DIAGNOSIS — Z789 Other specified health status: Secondary | ICD-10-CM

## 2022-05-24 DIAGNOSIS — Z21 Asymptomatic human immunodeficiency virus [HIV] infection status: Secondary | ICD-10-CM

## 2022-05-24 NOTE — Progress Notes (Signed)
Patient was seen on Oct 4 for vaginal bleeding in pregnancy. Kathrene Alu RN

## 2022-05-24 NOTE — Progress Notes (Signed)
   PRENATAL VISIT NOTE  Subjective:  Mary Zhang is a 31 y.o. [id number] at [redacted]w[redacted]d being seen today for ongoing prenatal care.  She is currently monitored for the following issues for this high-risk pregnancy and has Lead exposure; Language barrier; Benign gestational thrombocytopenia, antepartum ([place]); History of preterm delivery; Tinea; HIV infection ([employer]); and Supervision of other high risk pregnancy, antepartum on their problem list.  Patient reports  has had a few more episodes vaginal bleeding. Last one was 6 months ago.     . Vag. Bleeding: Scant.  Movement: Absent. Denies leaking of fluid.   The following portions of the patient's history were reviewed and updated as appropriate: allergies, current medications, past family history, past medical history, past social history, past surgical history and problem list.   Objective:   Vitals:   05/24/22 1426  BP: 100/68  Pulse: 87  Weight: 163 lb (73.9 kg)    Fetal Status: Fetal Heart Rate (bpm): 150   Movement: Absent     General:  Alert, oriented and cooperative. Patient is in no acute distress.  Skin: Skin is warm and dry. No rash noted.   Cardiovascular: Normal heart rate noted  Respiratory: Normal respiratory effort, no problems with respiration noted  Abdomen: Soft, gravid, appropriate for gestational age.  Pain/Pressure: Absent     Pelvic: Cervical exam deferred        Extremities: Normal range of motion.  Edema: Trace  Mental Status: Normal mood and affect. Normal behavior. Normal judgment and thought content.   Assessment and Plan:  Pregnancy: [id number] at [redacted]w[redacted]d 1. Supervision of other high risk pregnancy, antepartum FHT and FH normal  2. Language barrier Interpreter used.  3. Asymptomatic HIV infection, with no history of HIV-related illness ([place]) On antivirals. Undetectible viral load  4. Benign gestational thrombocytopenia, antepartum ([place])  5. History of preterm delivery   Preterm labor symptoms and general  obstetric precautions including but not limited to vaginal bleeding, contractions, leaking of fluid and fetal movement were reviewed in detail with the patient. Please refer to After Visit Summary for other counseling recommendations.   No follow-ups on file.  Future Appointments  Date Time Provider [place]  06/04/2022  9:15 AM [facility] NURSE [facility]  06/04/2022  9:30 AM [facility] US2 [facility]  06/21/2022  9:55 AM Truett Mainland, DO [facility] None  08/13/2022 10:15 AM Carlyle Basques, MD [facility]    Truett Mainland, DO

## 2022-05-24 NOTE — Progress Notes (Signed)
Interpreter used for visit [id number]. Kathrene Alu RN

## 2022-06-04 ENCOUNTER — Ambulatory Visit: Payer: Medicaid Other | Attending: Family Medicine

## 2022-06-04 ENCOUNTER — Ambulatory Visit: Payer: Medicaid Other | Admitting: *Deleted

## 2022-06-04 ENCOUNTER — Other Ambulatory Visit: Payer: Self-pay | Admitting: *Deleted

## 2022-06-04 ENCOUNTER — Encounter: Payer: Self-pay | Admitting: *Deleted

## 2022-06-04 VITALS — BP 97/55 | HR 89

## 2022-06-04 DIAGNOSIS — O99112 Other diseases of the blood and blood-forming organs and certain disorders involving the immune mechanism complicating pregnancy, second trimester: Secondary | ICD-10-CM

## 2022-06-04 DIAGNOSIS — O98719 Human immunodeficiency virus [HIV] disease complicating pregnancy, unspecified trimester: Secondary | ICD-10-CM

## 2022-06-04 DIAGNOSIS — Z21 Asymptomatic human immunodeficiency virus [HIV] infection status: Secondary | ICD-10-CM | POA: Diagnosis not present

## 2022-06-04 DIAGNOSIS — O09899 Supervision of other high risk pregnancies, unspecified trimester: Secondary | ICD-10-CM | POA: Diagnosis not present

## 2022-06-04 DIAGNOSIS — O99212 Obesity complicating pregnancy, second trimester: Secondary | ICD-10-CM

## 2022-06-21 ENCOUNTER — Ambulatory Visit (INDEPENDENT_AMBULATORY_CARE_PROVIDER_SITE_OTHER): Payer: Medicaid Other | Admitting: Family Medicine

## 2022-06-21 VITALS — BP 92/58 | HR 92 | Wt 164.0 lb

## 2022-06-21 DIAGNOSIS — Z3A23 23 weeks gestation of pregnancy: Secondary | ICD-10-CM

## 2022-06-21 DIAGNOSIS — O09892 Supervision of other high risk pregnancies, second trimester: Secondary | ICD-10-CM

## 2022-06-21 DIAGNOSIS — O99119 Other diseases of the blood and blood-forming organs and certain disorders involving the immune mechanism complicating pregnancy, unspecified trimester: Secondary | ICD-10-CM

## 2022-06-21 DIAGNOSIS — Z21 Asymptomatic human immunodeficiency virus [HIV] infection status: Secondary | ICD-10-CM

## 2022-06-21 DIAGNOSIS — Z8751 Personal history of pre-term labor: Secondary | ICD-10-CM

## 2022-06-21 DIAGNOSIS — O09899 Supervision of other high risk pregnancies, unspecified trimester: Secondary | ICD-10-CM

## 2022-06-21 DIAGNOSIS — Z789 Other specified health status: Secondary | ICD-10-CM

## 2022-06-21 DIAGNOSIS — D696 Thrombocytopenia, unspecified: Secondary | ICD-10-CM

## 2022-06-21 NOTE — Progress Notes (Signed)
[employer] language services interpreter [place] Dec [phone].

## 2022-06-21 NOTE — Progress Notes (Signed)
   PRENATAL VISIT NOTE  Subjective:  Keyleigh Veillon is a 31 y.o. [id number] at [redacted]w[redacted]d being seen today for ongoing prenatal care.  She is currently monitored for the following issues for this high-risk pregnancy and has Lead exposure; Language barrier; Benign gestational thrombocytopenia, antepartum (HCC); History of preterm delivery; Tinea; HIV infection (HCC); and Supervision of other high risk pregnancy, antepartum on their problem list.  Patient reports no complaints.  Contractions: Not present. Vag. Bleeding: None.  Movement: Present. Denies leaking of fluid.   The following portions of the patient's history were reviewed and updated as appropriate: allergies, current medications, past family history, past medical history, past social history, past surgical history and problem list.   Objective:   Vitals:   06/21/22 1002  BP: (!) 92/58  Pulse: 92  Weight: 164 lb (74.4 kg)    Fetal Status: Fetal Heart Rate (bpm): 148 Fundal Height: 23 cm Movement: Present     General:  Alert, oriented and cooperative. Patient is in no acute distress.  Skin: Skin is warm and dry. No rash noted.   Cardiovascular: Normal heart rate noted  Respiratory: Normal respiratory effort, no problems with respiration noted  Abdomen: Soft, gravid, appropriate for gestational age.  Pain/Pressure: Absent     Pelvic: Cervical exam deferred        Extremities: Normal range of motion.  Edema: None  Mental Status: Normal mood and affect. Normal behavior. Normal judgment and thought content.   Assessment and Plan:  Pregnancy: G4P2103 at [redacted]w[redacted]d 1. [redacted] weeks gestation of pregnancy  2. Supervision of other high risk pregnancy, antepartum FHT and FH normal  3. Benign gestational thrombocytopenia, antepartum (HCC) Last platelets 140.   4. Asymptomatic HIV infection, with no history of HIV-related illness (HCC) On antivirals - doing well. Undetectable viral load.  5. Language barrier Interpreter used  6. History of  preterm delivery No PTL si/sxs.  Preterm labor symptoms and general obstetric precautions including but not limited to vaginal bleeding, contractions, leaking of fluid and fetal movement were reviewed in detail with the patient. Please refer to After Visit Summary for other counseling recommendations.   No follow-ups on file.  Future Appointments  Date Time Provider Department Center  07/10/2022 10:30 AM [facility] NURSE [facility]  07/10/2022 10:45 AM [facility] US5 [facility]  07/26/2022  8:35 AM Lennart Pall, MD [facility] None  08/09/2022 10:55 AM Levie Heritage, DO [facility] None  08/13/2022 10:15 AM Judyann Munson, MD [facility]  08/23/2022 10:35 AM Levie Heritage, DO [facility] None    Levie Heritage, DO

## 2022-07-10 ENCOUNTER — Other Ambulatory Visit: Payer: Self-pay | Admitting: *Deleted

## 2022-07-10 ENCOUNTER — Ambulatory Visit: Payer: Medicaid Other | Admitting: *Deleted

## 2022-07-10 ENCOUNTER — Ambulatory Visit: Payer: Medicaid Other | Attending: Obstetrics

## 2022-07-10 VITALS — BP 94/61 | HR 91

## 2022-07-10 DIAGNOSIS — E669 Obesity, unspecified: Secondary | ICD-10-CM

## 2022-07-10 DIAGNOSIS — B20 Human immunodeficiency virus [HIV] disease: Secondary | ICD-10-CM

## 2022-07-10 DIAGNOSIS — O98719 Human immunodeficiency virus [HIV] disease complicating pregnancy, unspecified trimester: Secondary | ICD-10-CM | POA: Diagnosis present

## 2022-07-10 DIAGNOSIS — O99212 Obesity complicating pregnancy, second trimester: Secondary | ICD-10-CM | POA: Diagnosis not present

## 2022-07-10 DIAGNOSIS — O09899 Supervision of other high risk pregnancies, unspecified trimester: Secondary | ICD-10-CM | POA: Insufficient documentation

## 2022-07-10 DIAGNOSIS — D696 Thrombocytopenia, unspecified: Secondary | ICD-10-CM | POA: Diagnosis present

## 2022-07-10 DIAGNOSIS — O98712 Human immunodeficiency virus [HIV] disease complicating pregnancy, second trimester: Secondary | ICD-10-CM | POA: Diagnosis not present

## 2022-07-10 DIAGNOSIS — O99112 Other diseases of the blood and blood-forming organs and certain disorders involving the immune mechanism complicating pregnancy, second trimester: Secondary | ICD-10-CM | POA: Insufficient documentation

## 2022-07-10 DIAGNOSIS — O09893 Supervision of other high risk pregnancies, third trimester: Secondary | ICD-10-CM

## 2022-07-10 DIAGNOSIS — O99213 Obesity complicating pregnancy, third trimester: Secondary | ICD-10-CM

## 2022-07-10 DIAGNOSIS — O09212 Supervision of pregnancy with history of pre-term labor, second trimester: Secondary | ICD-10-CM

## 2022-07-10 DIAGNOSIS — Z362 Encounter for other antenatal screening follow-up: Secondary | ICD-10-CM

## 2022-07-10 DIAGNOSIS — O99119 Other diseases of the blood and blood-forming organs and certain disorders involving the immune mechanism complicating pregnancy, unspecified trimester: Secondary | ICD-10-CM

## 2022-07-10 DIAGNOSIS — Z3A26 26 weeks gestation of pregnancy: Secondary | ICD-10-CM

## 2022-07-26 ENCOUNTER — Ambulatory Visit (INDEPENDENT_AMBULATORY_CARE_PROVIDER_SITE_OTHER): Payer: Medicaid Other | Admitting: Obstetrics and Gynecology

## 2022-07-26 VITALS — BP 90/50 | HR 81 | Wt 164.0 lb

## 2022-07-26 DIAGNOSIS — O99119 Other diseases of the blood and blood-forming organs and certain disorders involving the immune mechanism complicating pregnancy, unspecified trimester: Secondary | ICD-10-CM

## 2022-07-26 DIAGNOSIS — Z23 Encounter for immunization: Secondary | ICD-10-CM | POA: Diagnosis not present

## 2022-07-26 DIAGNOSIS — O09899 Supervision of other high risk pregnancies, unspecified trimester: Secondary | ICD-10-CM

## 2022-07-26 DIAGNOSIS — Z8751 Personal history of pre-term labor: Secondary | ICD-10-CM

## 2022-07-26 DIAGNOSIS — O99113 Other diseases of the blood and blood-forming organs and certain disorders involving the immune mechanism complicating pregnancy, third trimester: Secondary | ICD-10-CM

## 2022-07-26 DIAGNOSIS — D696 Thrombocytopenia, unspecified: Secondary | ICD-10-CM

## 2022-07-26 DIAGNOSIS — Z3A28 28 weeks gestation of pregnancy: Secondary | ICD-10-CM | POA: Diagnosis not present

## 2022-07-26 DIAGNOSIS — Z789 Other specified health status: Secondary | ICD-10-CM

## 2022-07-26 DIAGNOSIS — Z21 Asymptomatic human immunodeficiency virus [HIV] infection status: Secondary | ICD-10-CM

## 2022-07-26 DIAGNOSIS — O09893 Supervision of other high risk pregnancies, third trimester: Secondary | ICD-10-CM

## 2022-07-26 NOTE — Progress Notes (Signed)
   PRENATAL VISIT NOTE  Subjective:  Mary Zhang is a 32 y.o. [id number] at [redacted]w[redacted]d being seen today for ongoing prenatal care.  She is currently monitored for the following issues for this low-risk pregnancy and has Lead exposure; Language barrier; Benign gestational thrombocytopenia, antepartum ([place]); History of preterm delivery; Tinea; HIV infection ([place]); and Supervision of other high risk pregnancy, antepartum on their problem list.  Patient reports no complaints.  Contractions: Not present. Vag. Bleeding: None.  Movement: Present. Denies leaking of fluid.   The following portions of the patient's history were reviewed and updated as appropriate: allergies, current medications, past family history, past medical history, past social history, past surgical history and problem list.   Objective:   Vitals:   07/26/22 0827  BP: (!) 90/50  Pulse: 81  Weight: 164 lb (74.4 kg)    Fetal Status: Fetal Heart Rate (bpm): 140   Movement: Present     General:  Alert, oriented and cooperative. Patient is in no acute distress.  Skin: Skin is warm and dry. No rash noted.   Cardiovascular: Normal heart rate noted  Respiratory: Normal respiratory effort, no problems with respiration noted  Abdomen: Soft, gravid, appropriate for gestational age.  Pain/Pressure: Absent      Assessment and Plan:  Pregnancy: [id number] at [redacted]w[redacted]d 1. Supervision of other high risk pregnancy, antepartum 2h GTT, CBC, & RPR today Tdap  2. Asymptomatic HIV infection, with no history of HIV-related illness ([place]) Last VL undetectable on 10/26, due for repeat 1/18 Serial growth [place] per MFM. Most recently 988g (62%) at 26/2  3. Benign gestational thrombocytopenia, antepartum (HCC) CBC today  4. History of preterm delivery No si/sx PTL  5. Language barrier Interviewed and counseled with Amharic telephone interpreter  Please refer to After Visit Summary for other counseling recommendations.   No follow-ups on file.  Future  Appointments  Date Time Provider [place]  08/09/2022 10:55 AM Truett Mainland, DO [facility] None  08/13/2022 10:15 AM Carlyle Basques, MD [facility]  08/15/2022 10:45 AM [facility] NURSE [facility]  08/15/2022 11:00 AM [facility] US1 [facility]  08/23/2022 10:35 AM Nehemiah Settle, Tanna Savoy, DO [facility] None    Inez Catalina, MD

## 2022-07-26 NOTE — Progress Notes (Signed)
Patient is doing her 2 hr gtt today. Kathrene Alu RN

## 2022-07-27 LAB — GLUCOSE TOLERANCE, 2 HOURS W/ 1HR
Glucose, 1 hour: 153 mg/dL (ref 70–179)
Glucose, 2 hour: 135 mg/dL (ref 70–152)
Glucose, Fasting: 82 mg/dL (ref 70–91)

## 2022-07-27 LAB — RPR: RPR Ser Ql: NONREACTIVE

## 2022-07-27 LAB — CBC
Hematocrit: 35.9 % (ref 34.0–46.6)
Hemoglobin: 12.7 g/dL (ref 11.1–15.9)
MCH: 32.6 pg (ref 26.6–33.0)
MCHC: 35.4 g/dL (ref 31.5–35.7)
MCV: 92 fL (ref 79–97)
Platelets: 132 10*3/uL — ABNORMAL LOW (ref 150–450)
RBC: 3.89 x10E6/uL (ref 3.77–5.28)
RDW: 12.6 % (ref 11.7–15.4)
WBC: 6.9 10*3/uL (ref 3.4–10.8)

## 2022-08-09 ENCOUNTER — Ambulatory Visit (INDEPENDENT_AMBULATORY_CARE_PROVIDER_SITE_OTHER): Payer: Medicaid Other | Admitting: Family Medicine

## 2022-08-09 VITALS — BP 101/63 | HR 105 | Wt 168.0 lb

## 2022-08-09 DIAGNOSIS — Z789 Other specified health status: Secondary | ICD-10-CM

## 2022-08-09 DIAGNOSIS — O09899 Supervision of other high risk pregnancies, unspecified trimester: Secondary | ICD-10-CM

## 2022-08-09 DIAGNOSIS — Z8751 Personal history of pre-term labor: Secondary | ICD-10-CM

## 2022-08-09 DIAGNOSIS — O99113 Other diseases of the blood and blood-forming organs and certain disorders involving the immune mechanism complicating pregnancy, third trimester: Secondary | ICD-10-CM

## 2022-08-09 DIAGNOSIS — O09893 Supervision of other high risk pregnancies, third trimester: Secondary | ICD-10-CM

## 2022-08-09 DIAGNOSIS — Z21 Asymptomatic human immunodeficiency virus [HIV] infection status: Secondary | ICD-10-CM

## 2022-08-09 DIAGNOSIS — D696 Thrombocytopenia, unspecified: Secondary | ICD-10-CM

## 2022-08-09 DIAGNOSIS — Z3A30 30 weeks gestation of pregnancy: Secondary | ICD-10-CM

## 2022-08-09 NOTE — Progress Notes (Signed)
Interpreter [phone] video interpreter used for visit. Kathrene Alu RN

## 2022-08-09 NOTE — Progress Notes (Signed)
   PRENATAL VISIT NOTE  Subjective:  Mary Zhang is a 32 y.o. [id number] at [redacted]w[redacted]d being seen today for ongoing prenatal care.  She is currently monitored for the following issues for this high-risk pregnancy and has Lead exposure; Language barrier; Benign gestational thrombocytopenia, antepartum ([place]); History of preterm delivery; Tinea; HIV infection ([place]); and Supervision of other high risk pregnancy, antepartum on their problem list.  Patient reports  pelvic pressure .  Contractions: Not present. Vag. Bleeding: None.  Movement: Present. Denies leaking of fluid.   The following portions of the patient's history were reviewed and updated as appropriate: allergies, current medications, past family history, past medical history, past social history, past surgical history and problem list.   Objective:   Vitals:   08/09/22 1052  BP: 101/63  Pulse: (!) 105  Weight: 168 lb (76.2 kg)    Fetal Status: Fetal Heart Rate (bpm): 145 Fundal Height: 30 cm Movement: Present     General:  Alert, oriented and cooperative. Patient is in no acute distress.  Skin: Skin is warm and dry. No rash noted.   Cardiovascular: Normal heart rate noted  Respiratory: Normal respiratory effort, no problems with respiration noted  Abdomen: Soft, gravid, appropriate for gestational age.  Pain/Pressure: Absent     Pelvic: Cervical exam performed in the presence of a chaperone Dilation: Closed Effacement (%): Thick    Extremities: Normal range of motion.  Edema: None  Mental Status: Normal mood and affect. Normal behavior. Normal judgment and thought content.   Assessment and Plan:  Pregnancy: G4P2103 at [redacted]w[redacted]d 1. [redacted] weeks gestation of pregnancy  2. Supervision of other high risk pregnancy, antepartum FHT and FH normal  3. Asymptomatic HIV infection, with no history of HIV-related illness ([place]) No problems with antivirals. Undetectible viral load last check. Has appt in 4 days.  4. Benign gestational  thrombocytopenia, antepartum ([place]) Recheck CBC at infectious disease or next appt.  5. History of preterm delivery  6. Language barrier Interpreter used  Preterm labor symptoms and general obstetric precautions including but not limited to vaginal bleeding, contractions, leaking of fluid and fetal movement were reviewed in detail with the patient. Please refer to After Visit Summary for other counseling recommendations.   No follow-ups on file.  Future Appointments  Date Time Provider [place]  08/13/2022 10:15 AM Carlyle Basques, MD [facility]  08/15/2022 10:45 AM [facility] NURSE [facility]  08/15/2022 11:00 AM [facility] US1 [facility]  08/23/2022 10:35 AM Truett Mainland, DO [facility] None  09/07/2022 10:55 AM Truett Mainland, DO [facility] None  09/20/2022 10:35 AM Truett Mainland, DO [facility] None  09/27/2022 10:35 AM Truett Mainland, DO [facility] None  10/04/2022 10:35 AM Truett Mainland, DO [facility] None  10/11/2022 10:35 AM Truett Mainland, DO [facility] None    Truett Mainland, DO

## 2022-08-13 ENCOUNTER — Encounter: Payer: Self-pay | Admitting: Internal Medicine

## 2022-08-13 ENCOUNTER — Other Ambulatory Visit: Payer: Self-pay

## 2022-08-13 ENCOUNTER — Ambulatory Visit (INDEPENDENT_AMBULATORY_CARE_PROVIDER_SITE_OTHER): Payer: Medicaid Other | Admitting: Internal Medicine

## 2022-08-13 VITALS — BP 92/60 | HR 96 | Temp 97.8°F | Wt 166.0 lb

## 2022-08-13 DIAGNOSIS — B20 Human immunodeficiency virus [HIV] disease: Secondary | ICD-10-CM

## 2022-08-13 MED ORDER — TRIUMEQ 600-50-300 MG PO TABS: 1.0000 | ORAL_TABLET | Freq: Every day | ORAL | 11 refills | Status: DC

## 2022-08-13 NOTE — Progress Notes (Signed)
Patient ID: [name], female   DOB: 1991/01/24, 32 y.o.   MRN: [id number]  HPI   Mary Zhang is a 32 y.o. [phone] at [redacted]w[redacted]d being seen today for ongoing prenatal care.  She is currently monitored for the following issues for this high-risk pregnancy and has Lead exposure; Language barrier; Benign gestational thrombocytopenia, antepartum ([place]); History of preterm delivery; Tinea; HIV infection ([place]); and Supervision of other high risk pregnancy, antepartum on their problem list.   Expecting a girl ( has 2 boys and girl already)  Outpatient Encounter Medications as of 08/13/2022  Medication Sig   abacavir-dolutegravir-lamiVUDine (TRIUMEQ) 600-50-300 MG tablet Take 1 tablet by mouth daily.   Prenatal Vit-Fe Fumarate-FA (PRENATAL VITAMIN AND MINERAL) 28-0.8 MG TABS Take 1 tablet by mouth daily.   No facility-administered encounter medications on file as of 08/13/2022.     Patient Active Problem List   Diagnosis Date Noted   Supervision of other high risk pregnancy, antepartum 03/28/2022   HIV infection ([place]) 08/26/2020   Tinea 06/28/2020   History of preterm delivery 01/05/2020   Language barrier 01/22/2019   Benign gestational thrombocytopenia, antepartum ([place]) 01/22/2019   Lead exposure 12/16/2015     Health Maintenance Due  Topic Date Due   COVID-19 Vaccine (3 - Pfizer risk series) 07/29/2020     Review of Systems Review of Systems  Constitutional: Negative for fever, chills, diaphoresis, activity change, appetite change, fatigue and unexpected weight change.  HENT: Negative for congestion, sore throat, rhinorrhea, sneezing, trouble swallowing and sinus pressure.  Eyes: Negative for photophobia and visual disturbance.  Respiratory: Negative for cough, chest tightness, shortness of breath, wheezing and stridor.  Cardiovascular: Negative for chest pain, palpitations and leg swelling.  Gastrointestinal: Negative for nausea, vomiting, abdominal pain, diarrhea, constipation,  blood in stool, abdominal distention and anal bleeding.  Genitourinary: Negative for dysuria, hematuria, flank pain and difficulty urinating.  Musculoskeletal: Negative for myalgias, back pain, joint swelling, arthralgias and gait problem.  Skin: Negative for color change, pallor, rash and wound.  Neurological: Negative for dizziness, tremors, weakness and light-headedness.  Hematological: Negative for adenopathy. Does not bruise/bleed easily.  Psychiatric/Behavioral: Negative for behavioral problems, confusion, sleep disturbance, dysphoric mood, decreased concentration and agitation.   Physical Exam   BP 92/60   Pulse 96   Temp 97.8 F (36.6 C) (Oral)   Wt 166 lb (75.3 kg)   LMP 01/07/2022   SpO2 99%   BMI 30.36 kg/m   Physical Exam  Constitutional:  oriented to person, place, and time. appears well-developed and well-nourished. No distress.  HENT: [place]/AT, PERRLA, no scleral icterus Mouth/Throat: Oropharynx is clear and moist. No oropharyngeal exudate.  Cardiovascular: Normal rate, regular rhythm and normal heart sounds. Exam reveals no gallop and no friction rub.  No murmur heard.  Pulmonary/Chest: Effort normal and breath sounds normal. No respiratory distress.  has no wheezes.  Neck = supple, no nuchal rigidity Abdominal: Soft. Bowel sounds are normal.  exhibits no distension. There is no tenderness.  Lymphadenopathy: no cervical adenopathy. No axillary adenopathy Neurological: alert and oriented to person, place, and time.  Skin: Skin is warm and dry. No rash noted. No erythema.  Psychiatric: a normal mood and affect.  behavior is normal.   Lab Results  Component Value Date   CD4TCELL 34 05/17/2022   Lab Results  Component Value Date   CD4TABS 337 (L) 05/17/2022   CD4TABS 420 02/15/2022   CD4TABS 411 08/07/2021   Lab Results  Component Value Date  HIV1RNAQUANT Not Detected 05/17/2022   No results found for: "HEPBSAB" Lab Results  Component Value Date   LABRPR  Non Reactive 07/26/2022    CBC Lab Results  Component Value Date   WBC 6.9 07/26/2022   RBC 3.89 07/26/2022   HGB 12.7 07/26/2022   HCT 35.9 07/26/2022   PLT 132 (L) 07/26/2022   MCV 92 07/26/2022   MCH 32.6 07/26/2022   MCHC 35.4 07/26/2022   RDW 12.6 07/26/2022   LYMPHSABS 1,043 05/17/2022   MONOABS 384 02/12/2017   EOSABS 37 05/17/2022    BMET Lab Results  Component Value Date   NA 138 02/15/2022   K 3.5 02/15/2022   CL 105 02/15/2022   CO2 24 02/15/2022   GLUCOSE 97 02/15/2022   BUN 6 (L) 02/15/2022   CREATININE 0.46 (L) 02/15/2022   CALCIUM 9.5 02/15/2022   GFRNONAA >60 07/26/2020   GFRAA 163 02/22/2020      Assessment and Plan  Pregnancy in well controlled HIV positive mom = Talked about rsv vaccine to get in 10 days, since will be at 32 wk  Also have discussed bf would be safe- she was happy to think about it.   Will refill triumeq  See back in end of April  May move to different state? But will let us know

## 2022-08-15 ENCOUNTER — Ambulatory Visit: Payer: Medicaid Other | Attending: Obstetrics

## 2022-08-15 ENCOUNTER — Other Ambulatory Visit: Payer: Self-pay | Admitting: *Deleted

## 2022-08-15 ENCOUNTER — Ambulatory Visit: Payer: Medicaid Other | Admitting: *Deleted

## 2022-08-15 VITALS — BP 96/49 | HR 101

## 2022-08-15 DIAGNOSIS — O98713 Human immunodeficiency virus [HIV] disease complicating pregnancy, third trimester: Secondary | ICD-10-CM | POA: Diagnosis not present

## 2022-08-15 DIAGNOSIS — D696 Thrombocytopenia, unspecified: Secondary | ICD-10-CM | POA: Insufficient documentation

## 2022-08-15 DIAGNOSIS — O99113 Other diseases of the blood and blood-forming organs and certain disorders involving the immune mechanism complicating pregnancy, third trimester: Secondary | ICD-10-CM | POA: Diagnosis not present

## 2022-08-15 DIAGNOSIS — O09893 Supervision of other high risk pregnancies, third trimester: Secondary | ICD-10-CM | POA: Insufficient documentation

## 2022-08-15 DIAGNOSIS — O99119 Other diseases of the blood and blood-forming organs and certain disorders involving the immune mechanism complicating pregnancy, unspecified trimester: Secondary | ICD-10-CM | POA: Diagnosis present

## 2022-08-15 DIAGNOSIS — O99213 Obesity complicating pregnancy, third trimester: Secondary | ICD-10-CM | POA: Insufficient documentation

## 2022-08-15 DIAGNOSIS — O98719 Human immunodeficiency virus [HIV] disease complicating pregnancy, unspecified trimester: Secondary | ICD-10-CM

## 2022-08-15 DIAGNOSIS — O09899 Supervision of other high risk pregnancies, unspecified trimester: Secondary | ICD-10-CM | POA: Diagnosis present

## 2022-08-15 DIAGNOSIS — Z3A31 31 weeks gestation of pregnancy: Secondary | ICD-10-CM

## 2022-08-15 DIAGNOSIS — O09213 Supervision of pregnancy with history of pre-term labor, third trimester: Secondary | ICD-10-CM | POA: Diagnosis not present

## 2022-08-15 DIAGNOSIS — B20 Human immunodeficiency virus [HIV] disease: Secondary | ICD-10-CM

## 2022-08-15 DIAGNOSIS — E669 Obesity, unspecified: Secondary | ICD-10-CM

## 2022-08-15 DIAGNOSIS — Z3689 Encounter for other specified antenatal screening: Secondary | ICD-10-CM

## 2022-08-15 LAB — HIV-1 RNA QUANT-NO REFLEX-BLD
HIV 1 RNA Quant: NOT DETECTED Copies/mL
HIV-1 RNA Quant, Log: NOT DETECTED Log cps/mL

## 2022-08-23 ENCOUNTER — Ambulatory Visit (INDEPENDENT_AMBULATORY_CARE_PROVIDER_SITE_OTHER): Payer: Medicaid Other | Admitting: Family Medicine

## 2022-08-23 VITALS — BP 95/66 | HR 91 | Wt 165.0 lb

## 2022-08-23 DIAGNOSIS — Z3A32 32 weeks gestation of pregnancy: Secondary | ICD-10-CM

## 2022-08-23 DIAGNOSIS — Z21 Asymptomatic human immunodeficiency virus [HIV] infection status: Secondary | ICD-10-CM

## 2022-08-23 DIAGNOSIS — O09899 Supervision of other high risk pregnancies, unspecified trimester: Secondary | ICD-10-CM

## 2022-08-23 DIAGNOSIS — Z789 Other specified health status: Secondary | ICD-10-CM

## 2022-08-23 DIAGNOSIS — O98713 Human immunodeficiency virus [HIV] disease complicating pregnancy, third trimester: Secondary | ICD-10-CM

## 2022-08-23 DIAGNOSIS — D696 Thrombocytopenia, unspecified: Secondary | ICD-10-CM

## 2022-08-23 MED ORDER — RSV PRE-FUSION F A&B VAC RCMB 120 MCG/0.5ML IM SOLR: 0.5000 mL | Freq: Once | INTRAMUSCULAR | 0 refills | Status: AC

## 2022-08-23 NOTE — Progress Notes (Signed)
INterpreter [phone] used for visit. Kathrene Alu RN

## 2022-08-23 NOTE — Progress Notes (Signed)
   PRENATAL VISIT NOTE  Subjective:  Mary Zhang is a 32 y.o. [id number] at [redacted]w[redacted]d being seen today for ongoing prenatal care.  She is currently monitored for the following issues for this high-risk pregnancy and has Lead exposure; Language barrier; Benign gestational thrombocytopenia, antepartum ([place]); History of preterm delivery; Tinea; HIV infection ([place]); and Supervision of other high risk pregnancy, antepartum on their problem list.  Patient reports no complaints.  Contractions: Not present. Vag. Bleeding: None.  Movement: Present. Denies leaking of fluid.   The following portions of the patient's history were reviewed and updated as appropriate: allergies, current medications, past family history, past medical history, past social history, past surgical history and problem list.   Objective:   Vitals:   08/23/22 1039  BP: 95/66  Pulse: 91  Weight: 165 lb (74.8 kg)    Fetal Status: Fetal Heart Rate (bpm): 135   Movement: Present     General:  Alert, oriented and cooperative. Patient is in no acute distress.  Skin: Skin is warm and dry. No rash noted.   Cardiovascular: Normal heart rate noted  Respiratory: Normal respiratory effort, no problems with respiration noted  Abdomen: Soft, gravid, appropriate for gestational age.  Pain/Pressure: Present     Pelvic: Cervical exam deferred        Extremities: Normal range of motion.  Edema: None  Mental Status: Normal mood and affect. Normal behavior. Normal judgment and thought content.   Assessment and Plan:  Pregnancy: [id number] at [redacted]w[redacted]d 1. Language barrier Interpreter used  2. Asymptomatic HIV infection, with no history of HIV-related illness (Mary Zhang) On antivirals - undetectable viral load Normal growth  3. Supervision of other high risk pregnancy, antepartum  4. [redacted] weeks gestation of pregnancy  5. Benign gestational thrombocytopenia, antepartum (HCC) Check CBC today - CBC  Preterm labor symptoms and general obstetric precautions  including but not limited to vaginal bleeding, contractions, leaking of fluid and fetal movement were reviewed in detail with the patient. Please refer to After Visit Summary for other counseling recommendations.   No follow-ups on file.  Future Appointments  Date Time Provider [place]  09/07/2022 10:55 AM Truett Mainland, DO [facility] None  09/13/2022 10:45 AM [facility] NURSE [facility]  09/13/2022 11:00 AM [facility] US1 [facility]  09/20/2022 10:35 AM Truett Mainland, DO [facility] None  09/27/2022 10:35 AM Truett Mainland, DO [facility] None  10/04/2022 10:35 AM Truett Mainland, DO [facility] None  10/11/2022 10:35 AM Truett Mainland, DO [facility] None  11/12/2022 10:30 AM Carlyle Basques, MD [facility]    Truett Mainland, DO

## 2022-08-24 LAB — CBC
Hematocrit: 36.4 % (ref 34.0–46.6)
Hemoglobin: 13 g/dL (ref 11.1–15.9)
MCH: 33.5 pg — ABNORMAL HIGH (ref 26.6–33.0)
MCHC: 35.7 g/dL (ref 31.5–35.7)
MCV: 94 fL (ref 79–97)
Platelets: 130 10*3/uL — ABNORMAL LOW (ref 150–450)
RBC: 3.88 x10E6/uL (ref 3.77–5.28)
RDW: 13.8 % (ref 11.7–15.4)
WBC: 9.2 10*3/uL (ref 3.4–10.8)

## 2022-09-07 ENCOUNTER — Ambulatory Visit (INDEPENDENT_AMBULATORY_CARE_PROVIDER_SITE_OTHER): Payer: Medicaid Other | Admitting: Family Medicine

## 2022-09-07 VITALS — BP 95/53 | HR 98 | Wt 170.0 lb

## 2022-09-07 DIAGNOSIS — O09893 Supervision of other high risk pregnancies, third trimester: Secondary | ICD-10-CM

## 2022-09-07 DIAGNOSIS — D696 Thrombocytopenia, unspecified: Secondary | ICD-10-CM

## 2022-09-07 DIAGNOSIS — O09899 Supervision of other high risk pregnancies, unspecified trimester: Secondary | ICD-10-CM

## 2022-09-07 DIAGNOSIS — O99113 Other diseases of the blood and blood-forming organs and certain disorders involving the immune mechanism complicating pregnancy, third trimester: Secondary | ICD-10-CM

## 2022-09-07 DIAGNOSIS — Z789 Other specified health status: Secondary | ICD-10-CM

## 2022-09-07 DIAGNOSIS — Z3A34 34 weeks gestation of pregnancy: Secondary | ICD-10-CM

## 2022-09-07 DIAGNOSIS — Z21 Asymptomatic human immunodeficiency virus [HIV] infection status: Secondary | ICD-10-CM

## 2022-09-07 DIAGNOSIS — Z8751 Personal history of pre-term labor: Secondary | ICD-10-CM

## 2022-09-07 NOTE — Progress Notes (Signed)
   PRENATAL VISIT NOTE  Subjective:  Mary Zhang is a 32 y.o. [id number] at 4w5dbeing seen today for ongoing prenatal care.  She is currently monitored for the following issues for this high-risk pregnancy and has Lead exposure; Language barrier; Benign gestational thrombocytopenia, antepartum (HWilson; History of preterm delivery; Tinea; HIV infection ([place]; and Supervision of other high risk pregnancy, antepartum on their problem list.  Patient reports no complaints.  Contractions: Not present. Vag. Bleeding: None.  Movement: Present. Denies leaking of fluid.   The following portions of the patient's history were reviewed and updated as appropriate: allergies, current medications, past family history, past medical history, past social history, past surgical history and problem list.   Objective:   Vitals:   09/07/22 1124  BP: (!) 95/53  Pulse: 98  Weight: 170 lb (77.1 kg)    Fetal Status: Fetal Heart Rate (bpm): 154 Fundal Height: 34 cm Movement: Present  Presentation: Complete Breech  General:  Alert, oriented and cooperative. Patient is in no acute distress.  Skin: Skin is warm and dry. No rash noted.   Cardiovascular: Normal heart rate noted  Respiratory: Normal respiratory effort, no problems with respiration noted  Abdomen: Soft, gravid, appropriate for gestational age.  Pain/Pressure: Absent     Pelvic: Cervical exam deferred        Extremities: Normal range of motion.     Mental Status: Normal mood and affect. Normal behavior. Normal judgment and thought content.   Assessment and Plan:  Pregnancy: [id number] 386w5d. Supervision of other high risk pregnancy, antepartum FHT and FH normal  2. Asymptomatic HIV infection, with no history of HIV-related illness (HCBlandCompliant with medications Viral load undetectble. Growth normal  3. Benign gestational thrombocytopenia, antepartum (HCC) stable  4. History of preterm delivery  5. Language barrier Interpreter  used.  Preterm labor symptoms and general obstetric precautions including but not limited to vaginal bleeding, contractions, leaking of fluid and fetal movement were reviewed in detail with the patient. Please refer to After Visit Summary for other counseling recommendations.   No follow-ups on file.  Future Appointments  Date Time Provider [place]2/22/2024 10:45 AM [facility]SE [facility]/22[facility]20[facility] 11:00 AM [facility] US[facility]2/29/2024 10:35 AM StTruett MainlandDO [facility] None  09/27/2022 10:35 AM StTruett MainlandDO C[facility]-WM[facility] None  10/04/2022 10:35 AM StTruett MainlandDO C[facility]-WM[facility] None  10/11/2022 10:35 AM StTruett MainlandDO C[facility]-WM[facility] None  11/12/2022 10:30 AM SnCarlyle BasquesMD [facility]ID-[facility]ID RCID    JaTruett MainlandDO

## 2022-09-13 ENCOUNTER — Ambulatory Visit: Payer: Medicaid Other | Attending: Obstetrics

## 2022-09-13 ENCOUNTER — Ambulatory Visit: Payer: Medicaid Other | Admitting: *Deleted

## 2022-09-13 VITALS — BP 99/59 | HR 82

## 2022-09-13 DIAGNOSIS — O09899 Supervision of other high risk pregnancies, unspecified trimester: Secondary | ICD-10-CM | POA: Insufficient documentation

## 2022-09-13 DIAGNOSIS — Z3689 Encounter for other specified antenatal screening: Secondary | ICD-10-CM | POA: Diagnosis present

## 2022-09-13 DIAGNOSIS — O98713 Human immunodeficiency virus [HIV] disease complicating pregnancy, third trimester: Secondary | ICD-10-CM

## 2022-09-13 DIAGNOSIS — O99119 Other diseases of the blood and blood-forming organs and certain disorders involving the immune mechanism complicating pregnancy, unspecified trimester: Secondary | ICD-10-CM | POA: Diagnosis present

## 2022-09-13 DIAGNOSIS — E669 Obesity, unspecified: Secondary | ICD-10-CM

## 2022-09-13 DIAGNOSIS — B20 Human immunodeficiency virus [HIV] disease: Secondary | ICD-10-CM | POA: Diagnosis not present

## 2022-09-13 DIAGNOSIS — O99213 Obesity complicating pregnancy, third trimester: Secondary | ICD-10-CM | POA: Insufficient documentation

## 2022-09-13 DIAGNOSIS — D696 Thrombocytopenia, unspecified: Secondary | ICD-10-CM | POA: Insufficient documentation

## 2022-09-13 DIAGNOSIS — O99113 Other diseases of the blood and blood-forming organs and certain disorders involving the immune mechanism complicating pregnancy, third trimester: Secondary | ICD-10-CM | POA: Diagnosis not present

## 2022-09-13 DIAGNOSIS — Z3A35 35 weeks gestation of pregnancy: Secondary | ICD-10-CM

## 2022-09-13 DIAGNOSIS — O98719 Human immunodeficiency virus [HIV] disease complicating pregnancy, unspecified trimester: Secondary | ICD-10-CM | POA: Diagnosis not present

## 2022-09-13 DIAGNOSIS — O09213 Supervision of pregnancy with history of pre-term labor, third trimester: Secondary | ICD-10-CM

## 2022-09-20 ENCOUNTER — Other Ambulatory Visit (HOSPITAL_COMMUNITY)
Admission: RE | Admit: 2022-09-20 | Discharge: 2022-09-20 | Disposition: A | Discharge status: Home / Self Care | Payer: Medicaid Other | Source: Ambulatory Visit | Attending: Family Medicine | Admitting: Family Medicine

## 2022-09-20 ENCOUNTER — Ambulatory Visit (INDEPENDENT_AMBULATORY_CARE_PROVIDER_SITE_OTHER): Payer: Medicaid Other | Admitting: Family Medicine

## 2022-09-20 VITALS — BP 102/67 | HR 106 | Wt 170.0 lb

## 2022-09-20 DIAGNOSIS — O99119 Other diseases of the blood and blood-forming organs and certain disorders involving the immune mechanism complicating pregnancy, unspecified trimester: Secondary | ICD-10-CM

## 2022-09-20 DIAGNOSIS — Z21 Asymptomatic human immunodeficiency virus [HIV] infection status: Secondary | ICD-10-CM

## 2022-09-20 DIAGNOSIS — O09899 Supervision of other high risk pregnancies, unspecified trimester: Secondary | ICD-10-CM | POA: Diagnosis present

## 2022-09-20 DIAGNOSIS — Z8751 Personal history of pre-term labor: Secondary | ICD-10-CM

## 2022-09-20 DIAGNOSIS — Z789 Other specified health status: Secondary | ICD-10-CM

## 2022-09-20 DIAGNOSIS — Z3A36 36 weeks gestation of pregnancy: Secondary | ICD-10-CM | POA: Insufficient documentation

## 2022-09-20 DIAGNOSIS — D696 Thrombocytopenia, unspecified: Secondary | ICD-10-CM

## 2022-09-20 DIAGNOSIS — O09893 Supervision of other high risk pregnancies, third trimester: Secondary | ICD-10-CM

## 2022-09-20 DIAGNOSIS — O99113 Other diseases of the blood and blood-forming organs and certain disorders involving the immune mechanism complicating pregnancy, third trimester: Secondary | ICD-10-CM

## 2022-09-20 NOTE — Addendum Note (Signed)
Addended by: Phill Myron on: 09/20/2022 11:30 AM   Modules accepted: Orders

## 2022-09-20 NOTE — Progress Notes (Signed)
   PRENATAL VISIT NOTE  Subjective:  Mary Zhang is a 32 y.o. [id number] at 23w4dbeing seen today for ongoing prenatal care.  She is currently monitored for the following issues for this high-risk pregnancy and has Lead exposure; Language barrier; Benign gestational thrombocytopenia, antepartum ([place]; History of preterm delivery; Tinea; HIV infection (HLake Bosworth; and Supervision of other high risk pregnancy, antepartum on their problem list.  Patient reports no complaints.  Contractions: Not present. Vag. Bleeding: None.  Movement: Present. Denies leaking of fluid.   The following portions of the patient's history were reviewed and updated as appropriate: allergies, current medications, past family history, past medical history, past social history, past surgical history and problem list.   Objective:   Vitals:   09/20/22 1053  BP: 102/67  Pulse: (!) 106  Weight: 170 lb (77.1 kg)    Fetal Status: Fetal Heart Rate (bpm): 149 Fundal Height: 37 cm Movement: Present  Presentation: Vertex  General:  Alert, oriented and cooperative. Patient is in no acute distress.  Skin: Skin is warm and dry. No rash noted.   Cardiovascular: Normal heart rate noted  Respiratory: Normal respiratory effort, no problems with respiration noted  Abdomen: Soft, gravid, appropriate for gestational age.  Pain/Pressure: Present     Pelvic: Cervical exam performed in the presence of a chaperone Dilation: 1.5 Effacement (%): 50 Station: -3  Extremities: Normal range of motion.  Edema: None  Mental Status: Normal mood and affect. Normal behavior. Normal judgment and thought content.   Assessment and Plan:  Pregnancy: G4P2103 at 314w4d. [redacted] weeks gestation of pregnancy  2. Supervision of other high risk pregnancy, antepartum FHT and FH normal  3. Asymptomatic HIV infection, with no history of HIV-related illness (HCC) Undetectable load.  Induction at 39 weeks. AZT during delivery  4. Language barrier Interpreter  used  5. Benign gestational thrombocytopenia, antepartum (HCRosalia 6. History of preterm delivery  Preterm labor symptoms and general obstetric precautions including but not limited to vaginal bleeding, contractions, leaking of fluid and fetal movement were reviewed in detail with the patient. Please refer to After Visit Summary for other counseling recommendations.   No follow-ups on file.  Future Appointments  Date Time Provider [place]3/01/2023 10:35 AM StTruett MainlandDO C[facility] None  10/04/2022 10:35 AM StTruett MainlandDO CWH-WM[facility] None  10/11/2022 10:35 AM StTruett MainlandDO C[facility]-WM[facility] None  11/12/2022 10:30 AM SnCarlyle BasquesMD [facility]ID-[facility]    JaTruett MainlandDO

## 2022-09-20 NOTE — Progress Notes (Signed)
[employer] language services interpreter Clarise Cruz [phone]. Brieann Osinski l Rasheena Talmadge, CMA

## 2022-09-21 LAB — GC/CHLAMYDIA PROBE AMP (~~LOC~~) NOT AT ARMC
Chlamydia: NEGATIVE
Comment: NEGATIVE
Comment: NORMAL
Neisseria Gonorrhea: NEGATIVE

## 2022-09-25 LAB — CULTURE, BETA STREP (GROUP B ONLY): Strep Gp B Culture: NEGATIVE

## 2022-09-27 ENCOUNTER — Ambulatory Visit (INDEPENDENT_AMBULATORY_CARE_PROVIDER_SITE_OTHER): Payer: Medicaid Other | Admitting: Family Medicine

## 2022-09-27 VITALS — BP 97/60 | HR 88 | Wt 170.0 lb

## 2022-09-27 DIAGNOSIS — O329XX0 Maternal care for malpresentation of fetus, unspecified, not applicable or unspecified: Secondary | ICD-10-CM

## 2022-09-27 DIAGNOSIS — Z758 Other problems related to medical facilities and other health care: Secondary | ICD-10-CM

## 2022-09-27 DIAGNOSIS — O99113 Other diseases of the blood and blood-forming organs and certain disorders involving the immune mechanism complicating pregnancy, third trimester: Secondary | ICD-10-CM

## 2022-09-27 DIAGNOSIS — O99119 Other diseases of the blood and blood-forming organs and certain disorders involving the immune mechanism complicating pregnancy, unspecified trimester: Secondary | ICD-10-CM

## 2022-09-27 DIAGNOSIS — D696 Thrombocytopenia, unspecified: Secondary | ICD-10-CM

## 2022-09-27 DIAGNOSIS — O09893 Supervision of other high risk pregnancies, third trimester: Secondary | ICD-10-CM

## 2022-09-27 DIAGNOSIS — Z21 Asymptomatic human immunodeficiency virus [HIV] infection status: Secondary | ICD-10-CM

## 2022-09-27 DIAGNOSIS — Z789 Other specified health status: Secondary | ICD-10-CM

## 2022-09-27 DIAGNOSIS — O09899 Supervision of other high risk pregnancies, unspecified trimester: Secondary | ICD-10-CM

## 2022-09-27 DIAGNOSIS — Z3A37 37 weeks gestation of pregnancy: Secondary | ICD-10-CM

## 2022-09-27 DIAGNOSIS — Z8751 Personal history of pre-term labor: Secondary | ICD-10-CM

## 2022-09-27 NOTE — Progress Notes (Signed)
[employer] language services interpreter Samrawit [phone].

## 2022-09-27 NOTE — Progress Notes (Signed)
   PRENATAL VISIT NOTE  Subjective:  Mary Zhang is a 32 y.o. [id number] at 20w4dbeing seen today for ongoing prenatal care.  She is currently monitored for the following issues for this high-risk pregnancy and has Lead exposure; Language barrier; Benign gestational thrombocytopenia, antepartum (HBrookhaven; History of preterm delivery; Tinea; HIV infection (HKent; and Supervision of other high risk pregnancy, antepartum on their problem list.  Patient reports occasional contractions.  Contractions: Not present. Vag. Bleeding: None.  Movement: Present. Denies leaking of fluid.   The following portions of the patient's history were reviewed and updated as appropriate: allergies, current medications, past family history, past medical history, past social history, past surgical history and problem list.   Objective:   Vitals:   09/27/22 1025  BP: 97/60  Pulse: 88  Weight: 170 lb (77.1 kg)    Fetal Status: Fetal Heart Rate (bpm): 144 Fundal Height: 38 cm Movement: Present  Presentation: Complete Breech  General:  Alert, oriented and cooperative. Patient is in no acute distress.  Skin: Skin is warm and dry. No rash noted.   Cardiovascular: Normal heart rate noted  Respiratory: Normal respiratory effort, no problems with respiration noted  Abdomen: Soft, gravid, appropriate for gestational age.  Pain/Pressure: Present     Pelvic: Cervical exam deferred        Extremities: Normal range of motion.  Edema: None  Mental Status: Normal mood and affect. Normal behavior. Normal judgment and thought content.   Assessment and Plan:  Pregnancy: G4P2103 at 339w4d. [redacted] weeks gestation of pregnancy  2. Supervision of other high risk pregnancy, antepartum FHT and FH normal  3. Asymptomatic HIV infection, with no history of HIV-related illness (HCGuadalupeOn antivirals. No complications  4. History of preterm delivery  5. Language barrier Interpreter used  6. Benign gestational thrombocytopenia, antepartum  (HCC) stable  7. Malposition and malpresentation of fetus Discussed ECV - she and her husband want to talk about this. They realize that they don't have a lot of time to decide. Will call them Monday with interpreter to see what they choose.  Preterm labor symptoms and general obstetric precautions including but not limited to vaginal bleeding, contractions, leaking of fluid and fetal movement were reviewed in detail with the patient. Please refer to After Visit Summary for other counseling recommendations.   No follow-ups on file.  Future Appointments  Date Time Provider [place]3/14/2024 10:35 AM StTruett MainlandDO CWH-WM[facility] None  10/07/2022  7:15 AM MC-LD SCHED ROOM MC-INDC None  11/12/2022 10:30 AM SnCarlyle BasquesMD [facility]ID [facility]  11/15/2022 10:55 AM StNehemiah SettleJaTanna SavoyDO CWH-WMHP None    JaTruett MainlandDO

## 2022-10-03 ENCOUNTER — Encounter (HOSPITAL_COMMUNITY): Payer: Self-pay | Admitting: *Deleted

## 2022-10-03 ENCOUNTER — Telehealth: Payer: Self-pay

## 2022-10-03 ENCOUNTER — Telehealth (HOSPITAL_COMMUNITY): Payer: Self-pay | Admitting: *Deleted

## 2022-10-03 ENCOUNTER — Other Ambulatory Visit: Payer: Self-pay | Admitting: Advanced Practice Midwife

## 2022-10-03 NOTE — Telephone Encounter (Signed)
Preadmission screen interpreter number [phone]

## 2022-10-03 NOTE — Telephone Encounter (Signed)
Left message for patient to return call to office. Dynesha Woolen  RN 

## 2022-10-03 NOTE — Telephone Encounter (Signed)
-----   Message from Truett Mainland, DO sent at 10/03/2022 12:54 PM EDT ----- Regarding: ECV Can you call the patient today and see if she wants an ECV? The baby was breech at the last appt and she wanted time to think about it.

## 2022-10-04 ENCOUNTER — Ambulatory Visit (INDEPENDENT_AMBULATORY_CARE_PROVIDER_SITE_OTHER): Payer: Medicaid Other | Admitting: Family Medicine

## 2022-10-04 VITALS — BP 93/49 | HR 91 | Wt 171.0 lb

## 2022-10-04 DIAGNOSIS — Z3A38 38 weeks gestation of pregnancy: Secondary | ICD-10-CM

## 2022-10-04 DIAGNOSIS — O09899 Supervision of other high risk pregnancies, unspecified trimester: Secondary | ICD-10-CM

## 2022-10-04 DIAGNOSIS — O329XX1 Maternal care for malpresentation of fetus, unspecified, fetus 1: Secondary | ICD-10-CM

## 2022-10-04 DIAGNOSIS — Z789 Other specified health status: Secondary | ICD-10-CM

## 2022-10-04 DIAGNOSIS — Z8751 Personal history of pre-term labor: Secondary | ICD-10-CM

## 2022-10-04 DIAGNOSIS — D696 Thrombocytopenia, unspecified: Secondary | ICD-10-CM

## 2022-10-04 DIAGNOSIS — Z21 Asymptomatic human immunodeficiency virus [HIV] infection status: Secondary | ICD-10-CM

## 2022-10-04 DIAGNOSIS — O329XX0 Maternal care for malpresentation of fetus, unspecified, not applicable or unspecified: Secondary | ICD-10-CM

## 2022-10-04 DIAGNOSIS — O09893 Supervision of other high risk pregnancies, third trimester: Secondary | ICD-10-CM

## 2022-10-04 DIAGNOSIS — O9913 Other diseases of the blood and blood-forming organs and certain disorders involving the immune mechanism complicating the puerperium: Secondary | ICD-10-CM

## 2022-10-04 NOTE — Pre-Procedure Instructions (Signed)
Interpreter number [phone]

## 2022-10-04 NOTE — Progress Notes (Signed)
   PRENATAL VISIT NOTE  Subjective:  Mary Zhang is a 32 y.o. [id number] at [redacted]w[redacted]d being seen today for ongoing prenatal care.  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  Patient reports occasional contractions.  Contractions: Irritability. Vag. Bleeding: None.  Movement: Present. Denies leaking of fluid.   The following portions of the patient's history were reviewed and updated as appropriate: allergies, current medications, past family history, past medical history, past social history, past surgical history and problem list.   Objective:   Vitals:   10/04/22 1038  BP: (!) 93/49  Pulse: 91  Weight: 171 lb (77.6 kg)    Fetal Status:     Movement: Present     General:  Alert, oriented and cooperative. Patient is in no acute distress.  Skin: Skin is warm and dry. No rash noted.   Cardiovascular: Normal heart rate noted  Respiratory: Normal respiratory effort, no problems with respiration noted  Abdomen: Soft, gravid, appropriate for gestational age.  Pain/Pressure: Present     Pelvic: Cervical exam deferred        Extremities: Normal range of motion.  Edema: Trace  Mental Status: Normal mood and affect. Normal behavior. Normal judgment and thought content.   Assessment and Plan:  Pregnancy: [id number] at [redacted]w[redacted]d 1. Supervision of other high risk pregnancy, antepartum FHT and FH normal  2. Asymptomatic HIV infection, with no history of HIV-related illness ([place]) On antivirals.  3. Benign gestational thrombocytopenia, antepartum (HCC) Stable platelets  4. History of preterm delivery  5. Language barrier Interpreter used  6. Malposition and malpresentation of fetus Patient declines ECV. Discussed possible breech  vaginal delivery. Patient declines due to risk to baby. Would like to see on Sunday if baby has turned, if not, would prefer cesarean delivery.   Term labor symptoms and general obstetric precautions including but not limited to vaginal bleeding, contractions, leaking of fluid and fetal movement were reviewed in detail with the patient. Please refer to After Visit Summary for other counseling recommendations.   No follow-ups on file.  Future Appointments  Date Time Provider [place]  10/05/2022  9:30 AM MC-LD PAT 1 MC-INDC None  11/12/2022 10:30 AM Carlyle Basques, MD [facility]  11/15/2022 10:55 AM Nehemiah Settle, Tanna Savoy, DO C[facility] None    Truett Mainland, DO

## 2022-10-04 NOTE — Progress Notes (Signed)
Interpreter used [phone]

## 2022-10-05 ENCOUNTER — Encounter (HOSPITAL_COMMUNITY)
Admission: RE | Admit: 2022-10-05 | Discharge: 2022-10-05 | Disposition: A | Discharge status: Home / Self Care | Payer: Medicaid Other | Source: Ambulatory Visit | Attending: Family Medicine | Admitting: Family Medicine

## 2022-10-05 DIAGNOSIS — O329XX0 Maternal care for malpresentation of fetus, unspecified, not applicable or unspecified: Secondary | ICD-10-CM | POA: Insufficient documentation

## 2022-10-05 DIAGNOSIS — Z01812 Encounter for preprocedural laboratory examination: Secondary | ICD-10-CM | POA: Insufficient documentation

## 2022-10-05 LAB — CBC
HCT: 35.4 % — ABNORMAL LOW (ref 36.0–46.0)
Hemoglobin: 12.6 g/dL (ref 12.0–15.0)
MCH: 33.3 pg (ref 26.0–34.0)
MCHC: 35.6 g/dL (ref 30.0–36.0)
MCV: 93.7 fL (ref 80.0–100.0)
Platelets: 103 10*3/uL — ABNORMAL LOW (ref 150–400)
RBC: 3.78 MIL/uL — ABNORMAL LOW (ref 3.87–5.11)
RDW: 13.4 % (ref 11.5–15.5)
WBC: 6.6 10*3/uL (ref 4.0–10.5)
nRBC: 0 % (ref 0.0–0.2)

## 2022-10-05 LAB — TYPE AND SCREEN
ABO/RH(D): A POS
Antibody Screen: NEGATIVE

## 2022-10-05 NOTE — Patient Instructions (Addendum)
Mary Zhang  10/05/2022   Your procedure is scheduled on:  10/07/2022  Arrive at [place] at [employer] [facility] on [address] at [facility]  and [address] [employer] [address]. You are invited to use the FREE valet parking or use the Visitor's parking deck.  Pick up the phone at the desk and dial [phone].  Call this number if you have problems the morning of surgery: [phone]  Remember:   Do not eat food:(After Midnight) Desps de medianoche.  Do not drink clear liquids: (After Midnight) Desps de medianoche.  Take these medicines the morning of surgery with A SIP OF WATER:  triumeq   Do not wear jewelry, make-up or nail polish.  Do not wear lotions, powders, or perfumes. Do not wear deodorant.  Do not shave 48 hours prior to surgery.  Do not bring valuables to the hospital.  [facility] is not   responsible for any belongings or valuables brought to the hospital.  Contacts, dentures or bridgework may not be worn into surgery.  Leave suitcase in the car. After surgery it may be brought to your room.  For patients admitted to the hospital, checkout time is 11:00 AM the day of              discharge.      Please read over the following fact sheets that you were given:     Preparing for Surgery

## 2022-10-05 NOTE — Pre-Procedure Instructions (Signed)
Interpreter number [phone]

## 2022-10-06 ENCOUNTER — Encounter (HOSPITAL_COMMUNITY): Payer: Self-pay | Admitting: Obstetrics & Gynecology

## 2022-10-06 LAB — RPR: RPR Ser Ql: NONREACTIVE

## 2022-10-07 ENCOUNTER — Encounter (HOSPITAL_COMMUNITY)
Admission: AD | Disposition: A | Discharge status: Home / Self Care | Payer: Self-pay | Source: Home / Self Care | Attending: Obstetrics & Gynecology

## 2022-10-07 ENCOUNTER — Encounter (HOSPITAL_COMMUNITY): Payer: Self-pay | Admitting: Obstetrics & Gynecology

## 2022-10-07 ENCOUNTER — Inpatient Hospital Stay (HOSPITAL_COMMUNITY): Payer: Medicaid Other

## 2022-10-07 ENCOUNTER — Inpatient Hospital Stay (HOSPITAL_COMMUNITY)
Admission: AD | Admit: 2022-10-07 | Discharge: 2022-10-10 | DRG: 787 | Disposition: A | Discharge status: Home / Self Care | Payer: Medicaid Other | Attending: Obstetrics & Gynecology | Admitting: Obstetrics & Gynecology

## 2022-10-07 ENCOUNTER — Other Ambulatory Visit: Payer: Self-pay

## 2022-10-07 DIAGNOSIS — B20 Human immunodeficiency virus [HIV] disease: Secondary | ICD-10-CM | POA: Diagnosis present

## 2022-10-07 DIAGNOSIS — Z79899 Other long term (current) drug therapy: Secondary | ICD-10-CM

## 2022-10-07 DIAGNOSIS — O9872 Human immunodeficiency virus [HIV] disease complicating childbirth: Secondary | ICD-10-CM | POA: Diagnosis present

## 2022-10-07 DIAGNOSIS — Z3A39 39 weeks gestation of pregnancy: Secondary | ICD-10-CM

## 2022-10-07 DIAGNOSIS — Z789 Other specified health status: Secondary | ICD-10-CM | POA: Diagnosis present

## 2022-10-07 DIAGNOSIS — O9912 Other diseases of the blood and blood-forming organs and certain disorders involving the immune mechanism complicating childbirth: Secondary | ICD-10-CM | POA: Diagnosis present

## 2022-10-07 DIAGNOSIS — O321XX0 Maternal care for breech presentation, not applicable or unspecified: Secondary | ICD-10-CM | POA: Diagnosis present

## 2022-10-07 DIAGNOSIS — Z21 Asymptomatic human immunodeficiency virus [HIV] infection status: Secondary | ICD-10-CM | POA: Diagnosis present

## 2022-10-07 DIAGNOSIS — Z77011 Contact with and (suspected) exposure to lead: Secondary | ICD-10-CM | POA: Diagnosis present

## 2022-10-07 DIAGNOSIS — D696 Thrombocytopenia, unspecified: Secondary | ICD-10-CM | POA: Diagnosis present

## 2022-10-07 DIAGNOSIS — O09899 Supervision of other high risk pregnancies, unspecified trimester: Secondary | ICD-10-CM

## 2022-10-07 DIAGNOSIS — Z758 Other problems related to medical facilities and other health care: Secondary | ICD-10-CM | POA: Diagnosis present

## 2022-10-07 DIAGNOSIS — D6959 Other secondary thrombocytopenia: Secondary | ICD-10-CM | POA: Diagnosis present

## 2022-10-07 DIAGNOSIS — Z8751 Personal history of pre-term labor: Secondary | ICD-10-CM

## 2022-10-07 SURGERY — Surgical Case
Anesthesia: Spinal | Site: Abdomen

## 2022-10-07 MED ORDER — ACETAMINOPHEN 500 MG PO TABS
1000.0000 mg | ORAL_TABLET | Freq: Four times a day (QID) | ORAL | Status: DC
  Administered 2022-10-07 – 2022-10-10 (×11): 1000 mg via ORAL
  Filled 2022-10-07 (×11): qty 2

## 2022-10-07 MED ORDER — FENTANYL CITRATE (PF) 100 MCG/2ML IJ SOLN
INTRAMUSCULAR | Status: DC | PRN
  Administered 2022-10-07: 15 ug via INTRATHECAL

## 2022-10-07 MED ORDER — SOD CITRATE-CITRIC ACID 500-334 MG/5ML PO SOLN
ORAL | Status: AC
  Filled 2022-10-07: qty 30

## 2022-10-07 MED ORDER — FENTANYL CITRATE (PF) 100 MCG/2ML IJ SOLN
INTRAMUSCULAR | Status: AC
  Filled 2022-10-07: qty 2

## 2022-10-07 MED ORDER — KETOROLAC TROMETHAMINE 30 MG/ML IJ SOLN
30.0000 mg | Freq: Four times a day (QID) | INTRAMUSCULAR | Status: AC
  Administered 2022-10-07 – 2022-10-08 (×3): 30 mg via INTRAVENOUS
  Filled 2022-10-07 (×3): qty 1

## 2022-10-07 MED ORDER — TERBUTALINE SULFATE 1 MG/ML IJ SOLN
0.2500 mg | Freq: Once | INTRAMUSCULAR | Status: AC
  Administered 2022-10-07: 0.25 mg via SUBCUTANEOUS

## 2022-10-07 MED ORDER — DIBUCAINE (PERIANAL) 1 % EX OINT: 1.0000 | TOPICAL_OINTMENT | CUTANEOUS | Status: DC | PRN

## 2022-10-07 MED ORDER — OXYCODONE HCL 5 MG/5ML PO SOLN: 5.0000 mg | Freq: Once | ORAL | Status: DC | PRN

## 2022-10-07 MED ORDER — PRENATAL MULTIVITAMIN CH
1.0000 | ORAL_TABLET | Freq: Every day | ORAL | Status: DC
  Administered 2022-10-08 – 2022-10-10 (×3): 1 via ORAL
  Filled 2022-10-07 (×3): qty 1

## 2022-10-07 MED ORDER — IBUPROFEN 600 MG PO TABS
600.0000 mg | ORAL_TABLET | Freq: Four times a day (QID) | ORAL | Status: DC
  Administered 2022-10-08 – 2022-10-10 (×7): 600 mg via ORAL
  Filled 2022-10-07 (×7): qty 1

## 2022-10-07 MED ORDER — ONDANSETRON HCL 4 MG/2ML IJ SOLN
INTRAMUSCULAR | Status: DC | PRN
  Administered 2022-10-07: 4 mg via INTRAVENOUS

## 2022-10-07 MED ORDER — SIMETHICONE 80 MG PO CHEW: 80.0000 mg | CHEWABLE_TABLET | ORAL | Status: DC | PRN

## 2022-10-07 MED ORDER — ENOXAPARIN SODIUM 40 MG/0.4ML IJ SOSY
40.0000 mg | PREFILLED_SYRINGE | INTRAMUSCULAR | Status: DC
  Administered 2022-10-08 – 2022-10-10 (×3): 40 mg via SUBCUTANEOUS
  Filled 2022-10-07 (×3): qty 0.4

## 2022-10-07 MED ORDER — KETOROLAC TROMETHAMINE 30 MG/ML IJ SOLN
30.0000 mg | Freq: Once | INTRAMUSCULAR | Status: AC | PRN
  Administered 2022-10-07: 30 mg via INTRAVENOUS

## 2022-10-07 MED ORDER — MORPHINE SULFATE (PF) 0.5 MG/ML IJ SOLN
INTRAMUSCULAR | Status: AC
  Filled 2022-10-07: qty 10

## 2022-10-07 MED ORDER — DIPHENHYDRAMINE HCL 25 MG PO CAPS: 25.0000 mg | ORAL_CAPSULE | Freq: Four times a day (QID) | ORAL | Status: DC | PRN

## 2022-10-07 MED ORDER — ZIDOVUDINE 10 MG/ML IV SOLN
1.0000 mg/kg/h | INTRAVENOUS | Status: DC
  Administered 2022-10-07: 1 mg/kg/h via INTRAVENOUS
  Filled 2022-10-07 (×2): qty 40

## 2022-10-07 MED ORDER — OXYCODONE HCL 5 MG PO TABS
5.0000 mg | ORAL_TABLET | ORAL | Status: DC | PRN
  Administered 2022-10-08: 5 mg via ORAL
  Administered 2022-10-09 (×2): 10 mg via ORAL
  Administered 2022-10-09: 5 mg via ORAL
  Administered 2022-10-10: 10 mg via ORAL
  Filled 2022-10-07: qty 1
  Filled 2022-10-07 (×2): qty 2
  Filled 2022-10-07: qty 1
  Filled 2022-10-07: qty 2

## 2022-10-07 MED ORDER — SIMETHICONE 80 MG PO CHEW
80.0000 mg | CHEWABLE_TABLET | Freq: Three times a day (TID) | ORAL | Status: DC
  Administered 2022-10-08 – 2022-10-10 (×7): 80 mg via ORAL
  Filled 2022-10-07 (×8): qty 1

## 2022-10-07 MED ORDER — TERBUTALINE SULFATE 1 MG/ML IJ SOLN
INTRAMUSCULAR | Status: AC
  Filled 2022-10-07: qty 1

## 2022-10-07 MED ORDER — BUPIVACAINE LIPOSOME 1.3 % IJ SUSP
INTRAMUSCULAR | Status: AC
  Filled 2022-10-07: qty 20

## 2022-10-07 MED ORDER — OXYTOCIN-SODIUM CHLORIDE 30-0.9 UT/500ML-% IV SOLN
INTRAVENOUS | Status: DC | PRN
  Administered 2022-10-07: 350 mL via INTRAVENOUS

## 2022-10-07 MED ORDER — DIPHENHYDRAMINE HCL 25 MG PO CAPS: 25.0000 mg | ORAL_CAPSULE | ORAL | Status: DC | PRN

## 2022-10-07 MED ORDER — NALOXONE HCL 4 MG/10ML IJ SOLN: 1.0000 ug/kg/h | INTRAVENOUS | Status: DC | PRN

## 2022-10-07 MED ORDER — HYDROMORPHONE HCL 1 MG/ML IJ SOLN: 0.2500 mg | INTRAMUSCULAR | Status: DC | PRN

## 2022-10-07 MED ORDER — BUPIVACAINE IN DEXTROSE 0.75-8.25 % IT SOLN
INTRATHECAL | Status: DC | PRN
  Administered 2022-10-07: 1.6 mL via INTRATHECAL

## 2022-10-07 MED ORDER — KETOROLAC TROMETHAMINE 30 MG/ML IJ SOLN
INTRAMUSCULAR | Status: AC
  Filled 2022-10-07: qty 1

## 2022-10-07 MED ORDER — ZIDOVUDINE 10 MG/ML IV SOLN
2.0000 mg/kg | Freq: Once | INTRAVENOUS | Status: DC
  Filled 2022-10-07: qty 15.7

## 2022-10-07 MED ORDER — PROMETHAZINE HCL 25 MG/ML IJ SOLN: 6.2500 mg | INTRAMUSCULAR | Status: DC | PRN

## 2022-10-07 MED ORDER — STERILE WATER FOR IRRIGATION IR SOLN
Status: DC | PRN
  Administered 2022-10-07: 1000 mL

## 2022-10-07 MED ORDER — PHENYLEPHRINE HCL-NACL 20-0.9 MG/250ML-% IV SOLN
INTRAVENOUS | Status: DC | PRN
  Administered 2022-10-07: 60 ug/min via INTRAVENOUS

## 2022-10-07 MED ORDER — OXYTOCIN-SODIUM CHLORIDE 30-0.9 UT/500ML-% IV SOLN
2.5000 [IU]/h | INTRAVENOUS | Status: AC
  Administered 2022-10-07: 2.5 [IU]/h via INTRAVENOUS
  Filled 2022-10-07: qty 500

## 2022-10-07 MED ORDER — ABACAVIR-DOLUTEGRAVIR-LAMIVUD 600-50-300 MG PO TABS
1.0000 | ORAL_TABLET | Freq: Every day | ORAL | Status: DC
  Administered 2022-10-08 – 2022-10-10 (×3): 1 via ORAL
  Filled 2022-10-07 (×4): qty 1

## 2022-10-07 MED ORDER — PHENYLEPHRINE 80 MCG/ML (10ML) SYRINGE FOR IV PUSH (FOR BLOOD PRESSURE SUPPORT)
PREFILLED_SYRINGE | INTRAVENOUS | Status: DC | PRN
  Administered 2022-10-07: 160 ug via INTRAVENOUS
  Administered 2022-10-07 (×2): 80 ug via INTRAVENOUS
  Administered 2022-10-07 (×2): 160 ug via INTRAVENOUS
  Administered 2022-10-07: 80 ug via INTRAVENOUS

## 2022-10-07 MED ORDER — POVIDONE-IODINE 10 % EX SWAB: 2.0000 | Freq: Once | CUTANEOUS | Status: DC

## 2022-10-07 MED ORDER — COCONUT OIL OIL: 1.0000 | TOPICAL_OIL | Status: DC | PRN

## 2022-10-07 MED ORDER — NALOXONE HCL 0.4 MG/ML IJ SOLN: 0.4000 mg | INTRAMUSCULAR | Status: DC | PRN

## 2022-10-07 MED ORDER — LACTATED RINGERS IV SOLN: INTRAVENOUS | Status: DC

## 2022-10-07 MED ORDER — OXYCODONE HCL 5 MG PO TABS: 5.0000 mg | ORAL_TABLET | Freq: Once | ORAL | Status: DC | PRN

## 2022-10-07 MED ORDER — DIPHENHYDRAMINE HCL 50 MG/ML IJ SOLN: 12.5000 mg | INTRAMUSCULAR | Status: DC | PRN

## 2022-10-07 MED ORDER — DEXAMETHASONE SODIUM PHOSPHATE 10 MG/ML IJ SOLN
INTRAMUSCULAR | Status: DC | PRN
  Administered 2022-10-07: 10 mg via INTRAVENOUS

## 2022-10-07 MED ORDER — SODIUM CHLORIDE 0.9% FLUSH: 3.0000 mL | INTRAVENOUS | Status: DC | PRN

## 2022-10-07 MED ORDER — WITCH HAZEL-GLYCERIN EX PADS: 1.0000 | MEDICATED_PAD | CUTANEOUS | Status: DC | PRN

## 2022-10-07 MED ORDER — ZOLPIDEM TARTRATE 5 MG PO TABS: 5.0000 mg | ORAL_TABLET | Freq: Every evening | ORAL | Status: DC | PRN

## 2022-10-07 MED ORDER — CEFAZOLIN SODIUM-DEXTROSE 2-4 GM/100ML-% IV SOLN
INTRAVENOUS | Status: AC
  Filled 2022-10-07: qty 100

## 2022-10-07 MED ORDER — SENNOSIDES-DOCUSATE SODIUM 8.6-50 MG PO TABS
2.0000 | ORAL_TABLET | Freq: Every day | ORAL | Status: DC
  Administered 2022-10-08 – 2022-10-10 (×2): 2 via ORAL
  Filled 2022-10-07 (×3): qty 2

## 2022-10-07 MED ORDER — MENTHOL 3 MG MT LOZG: 1.0000 | LOZENGE | OROMUCOSAL | Status: DC | PRN

## 2022-10-07 MED ORDER — MORPHINE SULFATE (PF) 0.5 MG/ML IJ SOLN
INTRAMUSCULAR | Status: DC | PRN
  Administered 2022-10-07: 150 ug via INTRATHECAL

## 2022-10-07 MED ORDER — SOD CITRATE-CITRIC ACID 500-334 MG/5ML PO SOLN
30.0000 mL | Freq: Once | ORAL | Status: AC
  Administered 2022-10-07: 30 mL via ORAL

## 2022-10-07 SURGICAL SUPPLY — 34 items
CHLORAPREP W/TINT 26 (MISCELLANEOUS) ×2 IMPLANT
CLAMP UMBILICAL CORD (MISCELLANEOUS) ×1 IMPLANT
CLOTH BEACON ORANGE TIMEOUT ST (SAFETY) ×1 IMPLANT
DERMABOND ADVANCED .7 DNX12 (GAUZE/BANDAGES/DRESSINGS) ×2 IMPLANT
DRSG OPSITE POSTOP 4X10 (GAUZE/BANDAGES/DRESSINGS) ×1 IMPLANT
ELECT REM PT RETURN 9FT ADLT (ELECTROSURGICAL) ×1
ELECTRODE REM PT RTRN 9FT ADLT (ELECTROSURGICAL) ×1 IMPLANT
EXTRACTOR VACUUM BELL STYLE (SUCTIONS) IMPLANT
GLOVE BIOGEL PI IND STRL 7.0 (GLOVE) ×1 IMPLANT
GLOVE BIOGEL PI IND STRL 8 (GLOVE) ×1 IMPLANT
GLOVE ECLIPSE 8.0 STRL XLNG CF (GLOVE) ×1 IMPLANT
GOWN STRL REUS W/TWL LRG LVL3 (GOWN DISPOSABLE) ×2 IMPLANT
KIT ABG SYR 3ML LUER SLIP (SYRINGE) ×1 IMPLANT
NDL HYPO 18GX1.5 BLUNT FILL (NEEDLE) ×1 IMPLANT
NDL HYPO 25X5/8 SAFETYGLIDE (NEEDLE) ×1 IMPLANT
NEEDLE HYPO 18GX1.5 BLUNT FILL (NEEDLE) ×1 IMPLANT
NEEDLE HYPO 22GX1.5 SAFETY (NEEDLE) ×1 IMPLANT
NEEDLE HYPO 25X5/8 SAFETYGLIDE (NEEDLE) ×1 IMPLANT
NS IRRIG 1000ML POUR BTL (IV SOLUTION) ×1 IMPLANT
PACK C SECTION WH (CUSTOM PROCEDURE TRAY) ×1 IMPLANT
PAD OB MATERNITY 4.3X12.25 (PERSONAL CARE ITEMS) ×1 IMPLANT
RTRCTR C-SECT PINK 25CM LRG (MISCELLANEOUS) IMPLANT
SUT CHROMIC 0 CT 1 (SUTURE) ×1 IMPLANT
SUT MNCRL 0 VIOLET CTX 36 (SUTURE) ×2 IMPLANT
SUT MONOCRYL 0 CTX 36 (SUTURE) ×2
SUT PLAIN 2 0 (SUTURE)
SUT PLAIN 2 0 XLH (SUTURE) IMPLANT
SUT PLAIN ABS 2-0 CT1 27XMFL (SUTURE) IMPLANT
SUT VIC AB 0 CTX 36 (SUTURE) ×1
SUT VIC AB 0 CTX36XBRD ANBCTRL (SUTURE) ×1 IMPLANT
SUT VIC AB 4-0 KS 27 (SUTURE) IMPLANT
TOWEL OR 17X24 6PK STRL BLUE (TOWEL DISPOSABLE) ×1 IMPLANT
TRAY FOLEY W/BAG SLVR 14FR LF (SET/KITS/TRAYS/PACK) IMPLANT
WATER STERILE IRR 1000ML POUR (IV SOLUTION) ×1 IMPLANT

## 2022-10-07 NOTE — Progress Notes (Signed)
Drs. Elonda Husky and Cresenzo at bedside to perform version

## 2022-10-07 NOTE — Op Note (Signed)
Operative Note   Patient: Mary Zhang  Date of Procedure: 10/07/2022  Procedure: Primary Low Transverse Cesarean   Indications: malpresentation: breech    Pre-operative Diagnosis: Primary Cesarean Section for breech presentation.   Post-operative Diagnosis: Same  TOLAC Candidate: Yes   Surgeon: Surgeon(s) and Role:    * Eure, Mertie Clause, MD - Primary    * Caron Presume, Angelyn Punt, MD - Assisting  Assistants: none  An experienced assistant was required given the standard of surgical care given the complexity of the case.  This assistant was needed for exposure, dissection, suctioning, retraction, instrument exchange, assisting with delivery with administration of fundal pressure, and for overall help during the procedure.   Anesthesia: spinal  Anesthesiologist: No responsible provider has been recorded for the case.   Antibiotics: Cefazolin   Estimated Blood Loss: 461 ml   Total IV Fluids: 2000 ml  Urine Output:  100 cc OF clear urine  Specimens: Placenta   Complications: no complications   Indications: Mary Zhang is a 32 y.o. [id number] with an IUP [redacted]w[redacted]d presenting for scheduled cesarean secondary to the indications listed above. Clinical course notable for attempted ECV without success.  The risks of cesarean section discussed with the patient included but were not limited to: bleeding which may require transfusion or reoperation; infection which may require antibiotics; injury to bowel, bladder, ureters or other surrounding organs; injury to the fetus; need for additional procedures including hysterectomy in the event of a life-threatening hemorrhage; placental abnormalities with subsequent pregnancies, incisional problems, thromboembolic phenomenon and other postoperative/anesthesia complications. The patient concurred with the proposed plan, giving informed written consent for the procedure. Patient has been NPO since last night she will remain NPO for procedure. Anesthesia and OR  aware. Preoperative prophylactic antibiotics and SCDs ordered on call to the OR.    Findings: Viable infant in breech presentation, no nuchal cord present. Apgars , , . Weight  . Light meconium stained amniotic fluid. Normal placenta, three vessel cord. Normal uterus, Normal bilateral fallopian tubes, Normal bilateral ovaries.  Procedure Details: A Time Out was held and the above information confirmed. The patient received intravenous antibiotics and had sequential compression devices applied to her lower extremities preoperatively. The patient was taken back to the operative suite where spinal anesthesia was administered. After induction of anesthesia, the patient was draped and prepped in the usual sterile manner and placed in a dorsal supine position with a leftward tilt. A low transverse skin incision was made with scalpel and carried down through the subcutaneous tissue to the fascia. Fascial incision was made and extended transversely. The fascia was separated from the underlying rectus tissue superiorly and inferiorly. The rectus muscles were separated in the midline bluntly and the peritoneum was entered bluntly. A Rich retractor and bladder blade were placed to aid in visualization of the uterus. A bladder flap was not developed. A low transverse uterine incision was made. The infant was successfully delivered from breech presentation, the umbilical cord was clamped after 1 minute. Cord ph was sent, and cord blood was obtained for evaluation. The placenta was removed Intact and appeared normal. The uterine incision was closed with a single layer running unlocked suture of 0-Monocryl. Overall, excellent hemostasis was noted. The abdomen and the pelvis were cleared of all clot and debris and the Ubaldo Glassing was removed. Hemostasis was confirmed on all surfaces.  The peritoneum was reapproximated using 2-0 plain gut . The fascia was then closed using 0 Vicryl in a running fashion. The subcutaneous layer  was  reapproximated with 2-0 plain gut suture. The skin was closed with a 4-0 vicryl subcuticular stitch. The patient tolerated the procedure well. Sponge, lap, instrument and needle counts were correct x 2. She was taken to the recovery room in stable condition.  Disposition: PACU - hemodynamically stable.    Signed: Concepcion Living, MD [facility] Fellow  Center for [facility], [place]

## 2022-10-07 NOTE — Discharge Summary (Signed)
Postpartum Discharge Summary  Date of Service updated***     Patient Name: Mary Zhang DOB: Nov 20, 1990 MRN: [id number]  Date of admission: 10/07/2022 Delivery date:10/07/2022  Delivering provider: Florian Buff  Date of discharge: 10/07/2022  Admitting diagnosis: Breech Intrauterine pregnancy: [redacted]w[redacted]d     Secondary diagnosis:  Active Problems:   Language barrier   Benign gestational thrombocytopenia, antepartum ([place])   History of preterm delivery   HIV infection ([place])   Supervision of other high risk pregnancy, antepartum  Additional problems: ***    Discharge diagnosis: Term Pregnancy Delivered                                              Post partum procedures:{Postpartum procedures:23558} Augmentation: N/A Complications: None  Hospital course: Sceduled C/S   32 y.o. yo [phone] at [redacted]w[redacted]d was admitted to the hospital 10/07/2022 for scheduled cesarean section with the following indication:Malpresentation. EVC was attempted without success. Delivery details are as follows:  Membrane Rupture Time/Date: 11:18 AM ,10/07/2022   Delivery Method:C-Section, Low Transverse  Details of operation can be found in separate operative note.  Patient had a postpartum course complicated by***.  She is ambulating, tolerating a regular diet, passing flatus, and urinating well. Patient is discharged home in stable condition on  10/07/22        Newborn Data: Birth date:10/07/2022  Birth time:11:18 AM  Gender:Female  Living status:Living  Apgars: ,  Weight:     Magnesium Sulfate received: No BMZ received: No Rhophylac:N/A MMR:N/A T-DaP:Given prenatally Flu: No Transfusion:{Transfusion received:30440034}  Physical exam  Vitals:   10/07/22 1000 10/07/22 1214 10/07/22 1215 10/07/22 1216  BP: (!) 114/58 (!) 192/176  (!) 127/104  Pulse: 88  75 73  Resp: 19   (!) 21  Temp:    97.7 F (36.5 C)  TempSrc:    Oral  SpO2:      Weight:      Height:       General: {Exam;  general:21111117} Lochia: {Desc; appropriate/inappropriate:30686::"appropriate"} Uterine Fundus: {Desc; firm/soft:30687} Incision: {Exam; incision:21111123} DVT Evaluation: {Exam; dvt:2111122} Labs: Lab Results  Component Value Date   WBC 6.6 10/05/2022   HGB 12.6 10/05/2022   HCT 35.4 (L) 10/05/2022   MCV 93.7 10/05/2022   PLT 103 (L) 10/05/2022      Latest Ref Rng & Units 02/15/2022    4:01 AM  CMP  Glucose 65 - 99 mg/dL 97   BUN 7 - 25 mg/dL 6   Creatinine 0.50 - 0.96 mg/dL 0.46   Sodium 135 - 146 mmol/L 138   Potassium 3.5 - 5.3 mmol/L 3.5   Chloride 98 - 110 mmol/L 105   CO2 20 - 32 mmol/L 24   Calcium 8.6 - 10.2 mg/dL 9.5   Total Protein 6.1 - 8.1 g/dL 7.2   Total Bilirubin 0.2 - 1.2 mg/dL 0.8   AST 10 - 30 U/L 21   ALT 6 - 29 U/L 32    Edinburgh Score:    08/26/2020   10:43 AM  Edinburgh Postnatal Depression Scale Screening Tool  I have been able to laugh and see the funny side of things. 1  I have looked forward with enjoyment to things. 0  I have blamed myself unnecessarily when things went wrong. 0  I have been anxious or worried for no good reason. 0  I have felt  scared or panicky for no good reason. 0  Things have been getting on top of me. 0  I have been so unhappy that I have had difficulty sleeping. 0  I have felt sad or miserable. 0  I have been so unhappy that I have been crying. 0  The thought of harming myself has occurred to me. 0  Edinburgh Postnatal Depression Scale Total 1     After visit meds:  Allergies as of 10/07/2022   No Known Allergies   Med Rec must be completed prior to using this [facility]***        Discharge home in stable condition Infant Feeding: {Baby feeding:23562} Infant Disposition:{CHL IP OB HOME WITH OP:[id number] Discharge instruction: per After Visit Summary and Postpartum booklet. Activity: Advance as tolerated. Pelvic rest for 6 weeks.  Diet: {OB TF:3416389 Future Appointments: Future Appointments   Date Time Provider [place]  11/12/2022 10:30 AM Carlyle Basques, MD [facility]  11/15/2022 10:55 AM Nehemiah Settle, Tanna Savoy, DO [facility] None   Follow up Visit: Message sent   Please schedule this patient for a Virtual postpartum visit in 6 weeks with the following provider: Any provider. Additional Postpartum F/U:Incision check 1 week  High risk pregnancy complicated by:  HIV infection, Breech presentation in the third trimester  Delivery mode:  C-Section, Low Transverse  Anticipated Birth Control:  Nexplanon at postpartum visit.    10/07/2022 Concepcion Living, MD

## 2022-10-07 NOTE — Transfer of Care (Signed)
Immediate Anesthesia Transfer of Care Note  Patient: Mary Zhang  Procedure(s) Performed: CESAREAN SECTION (Abdomen)  Patient Location: PACU  Anesthesia Type:Spinal  Level of Consciousness: awake, alert , and oriented  Airway & Oxygen Therapy: Patient Spontanous Breathing  Post-op Assessment: Report given to RN and Post -op Vital signs reviewed and stable  Post vital signs: Reviewed and stable  Last Vitals:  Vitals Value Taken Time  BP 127/104 10/07/22 1216  Temp 36.5 C 10/07/22 1216  Pulse 62 10/07/22 1227  Resp 15 10/07/22 1228  SpO2 99 % 10/07/22 1227  Vitals shown include unvalidated device data.  Last Pain:  Vitals:   10/07/22 1216  TempSrc: Oral  PainSc: 0-No pain         Complications: No notable events documented.

## 2022-10-07 NOTE — H&P (Addendum)
Attestation of Attending Supervision of Advanced Practitioner (CNM/NP/PA): Evaluation and management procedures were performed by the Advanced Practitioner under my supervision and collaboration.  I have reviewed the Advanced Practitioner's note and chart, and I agree with the management and plan.  [name] MD Attending Physician for the Center for [facility] 10/07/2022 11:03 AM   Faculty Practice H&P  Mary Zhang is a 32 y.o. female [phone] with IUP at [redacted]w[redacted]d presenting for primary cesarean section for breech presentation . Pregnancy was been complicated by asymptomatic HIV, gestational thrombocytopenia, breech presentation in the third trimester, language barrier. .    Pt states she has been having no contractions,  vaginal bleeding, intact membranes, with normal fetal movement.     Prenatal Course Source of Care: [facility] with onset of care at [id number]  Pregnancy complications or risks: Patient Active Problem List   Diagnosis Date Noted   Supervision of other high risk pregnancy, antepartum 03/28/2022   HIV infection ([place]) 08/26/2020   Tinea 06/28/2020   History of preterm delivery 01/05/2020   Language barrier 01/22/2019   Benign gestational thrombocytopenia, antepartum ([place]) 01/22/2019   Lead exposure 12/16/2015   She desires  OP Nexplanon  for contraception.  She plans to breastfeed, plans to bottle feed  Prenatal labs and studies: ABO, Rh: --/--/A POS (03/15 0950) Antibody: NEG (03/15 0950) Rubella: 2.46 (09/06 1010) RPR: NON REACTIVE (03/15 0944)  HBsAg: Negative (09/06 1010)  HIV: Preliminary Reactive (09/06 1010)  GBS: Negative/-- (02/29 1130)  2hr Glucola: negative Genetic screening: normal Anatomy US: normal  Past Medical History:  Past Medical History:  Diagnosis Date   Gestational thrombocytopenia ([place])    HIV disease ([place]) 09/30/2015   HIV infection ([place]) 08/26/2020   Low blood pressure, not hypotension 01/02/2016   Menorrhagia    Raised antibody  titer 10/25/2015   High toxoplasma gondi antibody titer   Shingles     Past Surgical History:  Past Surgical History:  Procedure Laterality Date   NO PAST SURGERIES      Obstetrical History:  OB History     Gravida  4   Para  3   Term  2   Preterm  1   AB      Living  3      SAB      IAB      Ectopic      Multiple  0   Live Births  3           Gynecological History:  OB History     Gravida  4   Para  3   Term  2   Preterm  1   AB      Living  3      SAB      IAB      Ectopic      Multiple  0   Live Births  3           Social History:  Social History   Socioeconomic History   Marital status: Married    Spouse name: Not on file   Number of children: Not on file   Years of education: Not on file   Highest education level: Not on file  Occupational History   Not on file  Tobacco Use   Smoking status: Never   Smokeless tobacco: Never  Vaping Use   Vaping Use: Never used  Substance and Sexual Activity   Alcohol use: No   Drug use:  No   Sexual activity: Yes    Partners: Male    Birth control/protection: None    Comment: declined condoms  Other Topics Concern   Not on file  Social History Narrative   Not on file   Social Determinants of Health   Financial Resource Strain: Not on file  Food Insecurity: No Food Insecurity (01/08/2019)   Hunger Vital Sign    Worried About Running Out of Food in the Last Year: Never true    Ran Out of Food in the Last Year: Never true  Transportation Needs: No Transportation Needs (01/08/2019)   PRAPARE - Hydrologist (Medical): No    Lack of Transportation (Non-Medical): No  Physical Activity: Not on file  Stress: Not on file  Social Connections: Not on file    Family History:  Family History  Problem Relation Age of Onset   Hypertension Neg Hx    Diabetes Neg Hx     Medications:  Prenatal vitamins,  Current Facility-Administered Medications   Medication Dose Route Frequency Provider Last Rate Last Admin   lactated ringers infusion   Intravenous Continuous Truett Mainland, DO 125 mL/hr at 10/07/22 0720 Continued from Pre-op at 10/07/22 0720   povidone-iodine 10 % swab 2 Application  2 Application Topical Once Stinson, Jacob J, DO       sodium citrate-citric acid (ORACIT) solution 30 mL  30 mL Oral Once Lynda Rainwater, MD       zidovudine (RETROVIR) 157 mg in dextrose 5 % 100 mL IVPB **LOADING DOSE**  2 mg/kg Intravenous Once Autry-Lott, Simone, DO 115.7 mL/hr at 10/07/22 0747 Restarted at 10/07/22 0747   zidovudine (RETROVIR) 400 mg in dextrose 5 % 100 mL (4 mg/mL) **INFUSION**  1 mg/kg/hr Intravenous Continuous Autry-Lott, Simone, DO 19.68 mL/hr at 10/07/22 0841 1 mg/kg/hr at 10/07/22 0841    Allergies: No Known Allergies  Review of Systems: -  Negative  Physical Exam: Blood pressure 105/74, pulse 82, temperature 98.1 F (36.7 C), temperature source Oral, resp. rate 16, height 5\' 2"  (1.575 m), weight 78.7 kg, last menstrual period 01/07/2022, SpO2 98 %, unknown if currently breastfeeding. GENERAL: Well-developed, well-nourished female in no acute distress.  LUNGS: Clear to auscultation bilaterally.  HEART: Regular rate and rhythm. ABDOMEN: Soft, nontender, nondistended, gravid. EFW 6 lbs EXTREMITIES: Nontender, no edema, 2+ distal pulses.   Presentation: breech   Pertinent Labs/Studies:   Lab Results  Component Value Date   WBC 6.6 10/05/2022   HGB 12.6 10/05/2022   HCT 35.4 (L) 10/05/2022   MCV 93.7 10/05/2022   PLT 103 (L) 10/05/2022    Assessment : Mary Zhang is a 32 y.o. [id number] at [redacted]w[redacted]d being admitted for cesarean section secondary to breech presentation.   Plan: After discussion with patient regarding possible ECV she is agreeable to an attempt.  ECV Procedure Note Patient was not given a spinal prior to attempting the procedure. NST was reactive prior to the procedure.  Patient was given 0.25 mg of  SQ terbutaline prior to initiating the procedure. ECV was attempted 2 times. Procedure was not successful. Patient was agreeable to proceeding with cesarean delivery.   The risks of cesarean section discussed with the patient included but were not limited to: bleeding which may require transfusion or reoperation; infection which may require antibiotics; injury to bowel, bladder, ureters or other surrounding organs; injury to the fetus; need for additional procedures including hysterectomy in the event of a life-threatening hemorrhage; placental abnormalities  wth subsequent pregnancies, incisional problems, thromboembolic phenomenon and other postoperative/anesthesia complications. The patient concurred with the proposed plan, giving informed written consent for the procedure.   Patient has been NPO since midnight and will remain NPO for procedure.  Preoperative prophylactic Ancef ordered on call to the OR.   HIV with undetectable viral load: AZT prior to procedure.   Concepcion Living, MD 10/07/2022, 10:02 AM

## 2022-10-07 NOTE — Anesthesia Postprocedure Evaluation (Signed)
Anesthesia Post Note  Patient: Mary Zhang  Procedure(s) Performed: CESAREAN SECTION (Abdomen)     Patient location during evaluation: PACU Anesthesia Type: Spinal Level of consciousness: awake and alert Pain management: pain level controlled Vital Signs Assessment: post-procedure vital signs reviewed and stable Respiratory status: spontaneous breathing, nonlabored ventilation and respiratory function stable Cardiovascular status: blood pressure returned to baseline and stable Postop Assessment: no apparent nausea or vomiting Anesthetic complications: no   No notable events documented.  Last Vitals:  Vitals:   10/07/22 1335 10/07/22 1336  BP:    Pulse: 63 62  Resp: 13 (!) 24  Temp:    SpO2: 100% 100%    Last Pain:  Vitals:   10/07/22 1329  TempSrc:   PainSc: 3    Pain Goal:    LLE Motor Response: No movement due to regional block (10/07/22 1315)   RLE Motor Response: Purposeful movement (10/07/22 1315)       Epidural/Spinal Function Cutaneous sensation: Tingles (10/07/22 1315), Patient able to flex knees:  (rocking calves back and forth) (10/07/22 1315), Patient able to lift hips off bed: No (10/07/22 1315), Back pain beyond tenderness at insertion site: No (10/07/22 1315), Progressively worsening motor and/or sensory loss: No (10/07/22 1315), Bowel and/or bladder incontinence post epidural: No (10/07/22 1315)  Lynda Rainwater

## 2022-10-07 NOTE — Anesthesia Procedure Notes (Signed)
Spinal  Patient location during procedure: OB Start time: 10/07/2022 10:52 AM End time: 10/07/2022 10:57 AM Reason for block: surgical anesthesia Staffing Performed: anesthesiologist  Anesthesiologist: Lynda Rainwater, MD Performed by: Lynda Rainwater, MD Authorized by: Lynda Rainwater, MD   Preanesthetic Checklist Completed: patient identified, IV checked, risks and benefits discussed, surgical consent, monitors and equipment checked, pre-op evaluation and timeout performed Spinal Block Patient position: sitting Prep: DuraPrep and site prepped and draped Patient monitoring: heart rate, cardiac monitor, continuous pulse ox and blood pressure Approach: midline Location: L3-4 Injection technique: single-shot Needle Needle type: Pencan  Needle gauge: 24 G Needle length: 10 cm Assessment Sensory level: T4 Events: CSF return

## 2022-10-07 NOTE — Anesthesia Preprocedure Evaluation (Signed)
Anesthesia Evaluation  Patient identified by MRN, date of birth, ID band Patient awake    Reviewed: Allergy & Precautions, H&P , NPO status , Patient's Chart, lab work & pertinent test results  Airway Mallampati: II  TM Distance: >3 FB Neck ROM: Full    Dental no notable dental hx.    Pulmonary neg pulmonary ROS   Pulmonary exam normal breath sounds clear to auscultation       Cardiovascular negative cardio ROS Normal cardiovascular exam Rhythm:Regular Rate:Normal     Neuro/Psych negative neurological ROS  negative psych ROS   GI/Hepatic negative GI ROS, Neg liver ROS,,,  Endo/Other  negative endocrine ROS    Renal/GU negative Renal ROS  negative genitourinary   Musculoskeletal negative musculoskeletal ROS (+)    Abdominal   Peds negative pediatric ROS (+)  Hematology  (+) HIV  Anesthesia Other Findings   Reproductive/Obstetrics (+) Pregnancy                             Anesthesia Physical Anesthesia Plan  ASA: 3  Anesthesia Plan: Spinal   Post-op Pain Management:    Induction: Intravenous  PONV Risk Score and Plan: Treatment may vary due to age or medical condition  Airway Management Planned: Natural Airway  Additional Equipment:   Intra-op Plan:   Post-operative Plan:   Informed Consent: I have reviewed the patients History and Physical, chart, labs and discussed the procedure including the risks, benefits and alternatives for the proposed anesthesia with the patient or authorized representative who has indicated his/her understanding and acceptance.     Dental advisory given  Plan Discussed with: CRNA  Anesthesia Plan Comments:        Anesthesia Quick Evaluation

## 2022-10-08 ENCOUNTER — Encounter (HOSPITAL_COMMUNITY): Payer: Self-pay | Admitting: Obstetrics & Gynecology

## 2022-10-08 LAB — CBC
HCT: 29.3 % — ABNORMAL LOW (ref 36.0–46.0)
Hemoglobin: 10.7 g/dL — ABNORMAL LOW (ref 12.0–15.0)
MCH: 34.5 pg — ABNORMAL HIGH (ref 26.0–34.0)
MCHC: 36.5 g/dL — ABNORMAL HIGH (ref 30.0–36.0)
MCV: 94.5 fL (ref 80.0–100.0)
Platelets: 109 10*3/uL — ABNORMAL LOW (ref 150–400)
RBC: 3.1 MIL/uL — ABNORMAL LOW (ref 3.87–5.11)
RDW: 13.6 % (ref 11.5–15.5)
WBC: 8.6 10*3/uL (ref 4.0–10.5)
nRBC: 0 % (ref 0.0–0.2)

## 2022-10-08 LAB — HIV-1 RNA QUANT-NO REFLEX-BLD
HIV 1 RNA Quant: <20 copies/mL
LOG10 HIV-1 RNA: UNDETERMINED log10copy/mL

## 2022-10-08 NOTE — Social Work (Signed)
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 CSW met with [name] to offer support and complete assessment. CSW entered the room and observed [name] and [name] sitting on the couch, infant lying comfortable in [name]'s  arms. CSW introduced self and requested privacy, [name] gave CSW verbal permission to complete assessment while [name] was present. CSW inquired about how [name] was feeling. [name] reported good. CSW inquired about [name] mood during pregnancy [name] reported a stable mood. CSW provided education regarding the baby blues period vs. perinatal mood disorders, discussed treatment and gave resources for mental health follow up if concerns arise.  CSW recommends self-evaluation during the postpartum time period using the New Mom Checklist from Postpartum Progress and encouraged [name] to contact a medical professional if symptoms are noted at any time. [name] identified her spouse as her support.   CSW explained about HIV exposure protocol for newborns. diagnosis, [name] reported she has been through the process with her other children. CSW inquired about what Infectious Disease provided they wanted the infant to follow up with, Family requested [facility], CSW scheduled an appointment for April 5th @ 2:20pm. CSW provided [name] with appointment information.   CSW provided review of Sudden Infant Death Syndrome (SIDS) precautions. [name] reported they have all necessary items for the infant including a crib and car seat. [name] reported the receive WIC and food stamps as an extra resource  CSW identifies no further need for intervention and no barriers to discharge at this time.  Letta Kocher, [place] Social Worker [phone]

## 2022-10-08 NOTE — Progress Notes (Addendum)
POSTPARTUM PROGRESS NOTE  POD #1  Subjective:  Mary Zhang is a 32 y.o. [id number] s/p pLTCS at [redacted]w[redacted]d. Today she notes that she is doing ok. She denies any problems with ambulating, voiding or po intake. Denies nausea or vomiting. She has not yet passing flatus, no BM.  Pain is tolerable.  Lochia minimal Denies fever/chills/chest pain/SOB.  no HA, no blurry vision, no RUQ pain  Objective: Blood pressure (!) 73/61, pulse 86, temperature 98.2 F (36.8 C), temperature source Oral, resp. rate 18, height 5\' 2"  (1.575 m), weight 78.7 kg, last menstrual period 01/07/2022, SpO2 99 %, unknown if currently breastfeeding.  Physical Exam:  General: alert, cooperative and no distress Chest: no respiratory distress, CTAB Heart: regular rate and rhythm Abdomen: soft, nontender, +BS Uterine Fundus: firm, appropriately tender Incision: C/D/I with honeycomb DVT Evaluation: No calf swelling or tenderness Extremities: no edema, no calf tenderness bilaterally Skin: warm, dry  Results for orders placed or performed during the hospital encounter of 10/07/22 (from the past 24 hour(s))  CBC     Status: Abnormal   Collection Time: 10/08/22  5:41 AM  Result Value Ref Range   WBC 8.6 4.0 - 10.5 K/uL   RBC 3.10 (L) 3.87 - 5.11 MIL/uL   Hemoglobin 10.7 (L) 12.0 - 15.0 g/dL   HCT 29.3 (L) 36.0 - 46.0 %   MCV 94.5 80.0 - 100.0 fL   MCH 34.5 (H) 26.0 - 34.0 pg   MCHC 36.5 (H) 30.0 - 36.0 g/dL   RDW 13.6 11.5 - 15.5 %   Platelets 109 (L) 150 - 400 K/uL   nRBC 0.0 0.0 - 0.2 %    Assessment/Plan: Mary Zhang is a 32 y.o. [id number] s/p pLTCS for breech presentation POD#1  -pain controlled -tolerating regular diet -encourage ambulation  -HIV- stable on ART, viral load undetectable  Contraception: outpatient Nexplanon Feeding: breast/bottle feeding  Dispo: Continue routine postop care   LOS: 1 day   Janyth Pupa, DO Faculty Attending, Center for [place] 10/08/2022, 8:58 AM

## 2022-10-09 MED ORDER — OXYCODONE HCL 5 MG PO TABS
5.0000 mg | ORAL_TABLET | Freq: Once | ORAL | Status: AC
  Administered 2022-10-09: 5 mg via ORAL
  Filled 2022-10-09: qty 1

## 2022-10-09 NOTE — Progress Notes (Signed)
POSTPARTUM PROGRESS NOTE  POD #2  Subjective:  Mary Zhang is a 32 y.o. [id number] s/p pLTCS at [redacted]w[redacted]d. She is in significant pain today, at the time of my evaluation. She was recently given some oxycodone and tylenol. She denies any problems with ambulating, voiding or po intake. Denies nausea or vomiting. She has had a bowel movement. Lochia minimal Denies fever/chills/chest pain/SOB.  no HA, no blurry vision, no RUQ pain  Objective: Blood pressure (!) 92/56, pulse 65, temperature 98.1 F (36.7 C), temperature source Oral, resp. rate 16, height 5\' 2"  (1.575 m), weight 78.7 kg, last menstrual period 01/07/2022, SpO2 100 %, unknown if currently breastfeeding.  Physical Exam:  General: alert, cooperative, appears uncomfortable, especially with trying to ambulate. Chest: no respiratory distress Heart: regular rate and rhythm Abdomen: soft, nontender, +BS Uterine Fundus: firm, appropriately tender Incision: C/D/I with honeycomb DVT Evaluation: No calf swelling or tenderness Extremities: no edema, no calf tenderness bilaterally Skin: warm, dry  No results found for this or any previous visit (from the past 24 hour(s)).   Assessment/Plan: Mary Zhang is a 32 y.o. [id number] s/p pLTCS for breech presentation POD#2  -pain poorly controlled: yet to fully optimize her analgesics. Recommend a 2nd dose of oxyocodine 5mg  now, as well as ibuprofen 800mg . She has tylenol 1000mg  TID scheduled. -tolerating regular diet -encourage ambulation  -HIV- stable on ART, viral load undetectable  Contraception: outpatient Nexplanon Feeding: breast/bottle feeding  Dispo: Continue routine postop care Plan for discharge home tomorrow.   LOS: 2 days   Liliane Channel MD MPH OB Fellow, [place] for [place] 10/09/2022

## 2022-10-10 MED ORDER — SIMETHICONE 80 MG PO CHEW: 80.0000 mg | CHEWABLE_TABLET | Freq: Three times a day (TID) | ORAL | 0 refills | Status: DC

## 2022-10-10 MED ORDER — OXYCODONE HCL 5 MG PO TABS: 5.0000 mg | ORAL_TABLET | Freq: Four times a day (QID) | ORAL | 0 refills | Status: DC | PRN

## 2022-10-10 MED ORDER — SENNOSIDES-DOCUSATE SODIUM 8.6-50 MG PO TABS: 2.0000 | ORAL_TABLET | Freq: Every day | ORAL | 0 refills | Status: DC

## 2022-10-10 MED ORDER — ACETAMINOPHEN 500 MG PO TABS: 1000.0000 mg | ORAL_TABLET | Freq: Three times a day (TID) | ORAL | 0 refills | Status: DC | PRN

## 2022-10-10 MED ORDER — IBUPROFEN 600 MG PO TABS: 600.0000 mg | ORAL_TABLET | Freq: Four times a day (QID) | ORAL | 0 refills | Status: DC

## 2022-10-10 NOTE — Discharge Instructions (Addendum)
??? ???? ?  5 ??? ??? ???????? ??? ??? ???? ?????  2. ?1 ???? ??? ???? ??????? ?????? ??? ????? ???? ?? ?4 ??? 6 ????? ???? ???? ??? ????? ????? ?????  3. ?????? ??? ??? Tylenol 1000 mg ?? ibuprofen 600 mg ??? ???? ???? ????? ??? ????? ????? ????? ???? ??? ??? ??????? ?????? ???? ????.  4. ????? ??? ????? ?????? ???? ????? ??? ?????  5. ?? 2 ??? ??? ??? ????? ??? ??? ?? ????? ????? ??? ??? ?? ???? ??? ??? ???? ???? ???? ??? ???? ?? ?? ??? ??? ??? ???? ?????  1. bek'edo t'igenawi ke5 k'enati beh?wala inidetegelets'ewi yemari welela libisuni ?wilik'u.  2. be1 saminiti wisit'i kititili yem?deregibeti yetemelalashi takam? lekitibati mirimera ina ke4 isike 6 saminitati wisit'i, ledihire wel?di gubinyitiwo betak'edewi mesereti.  3. lem?k'et'iluti t'ik'?ti k'enati Tylenol 1000 mg ina ibuprofen 600 mg bek'eni yetak'edu g?z?yati yiwisedu, yihimi himemiwoni lemek'wak'wami yiredali. leginyiti himemi inide ?sifelag?netu okis?kodoni mewisedi yichilalu.  4. yedemiwoni met'eni lemech'emeri inid?redawo bebireti yebelet?s'ege migibi yimegebu.  5. beye 2 se'atu yedidi lewit'i yem?feligi yes?ti biliti demi mefisesi, dinigetenya yehone kebadi rasi mitati, yek'enyi yehodi himemi, ye'iyita bilich'ita weyimi le'?d?si kefi yale yedemi gif?ti sigati kalebiwo yidewilu.   Take off the honeycomb dressing 5 days after surgery as specified.  2.  Follow-up outpatient in 1 week for an incision check and in 4 to 6 weeks, as scheduled for your postpartum visit.   3.  Take Tylenol 1000 mg and ibuprofen 600 mg scheduled times a day for the next few days, to help with your pain.  You can also take oxycodone as needed for breakthrough pain.  4. Eat some iron rich food to help boost up your blood levels.  5.  Please call if you start to experience increased vaginal bleeding requiring a change of pads every 2 hours, sudden onset severe headache, right abdominal pain, flashes of light in a vision or any concerns for new elevated  blood pressure.

## 2022-10-10 NOTE — Lactation Note (Signed)
This note was copied from a baby's chart. Lactation Consultation Note  Patient Name: Mary Zhang [id number] Date: 10/10/2022 Age:30 hours  Reason for consult: Follow-up assessment;Term;1st time breastfeeding;Breastfeeding assistance;MD order;Infant weight loss  P4, 39.0 GA, 10% weight loss  [employer], [place] [phone],  used during the visit. Mother reports this is her first time to breast feed. She formula fed her other 3 children. Mother +HIV and labs were pending to determine if breastfeeding was safe for newborn. Infant was formula feeding.  Mother started breastfeeding mostly in the last 24 hours and infant's weight loss is 10%. Observed baby feeding at the breast. Basic breastfeeding education taught. Mother is able to obtain a deep latch in cradle hold which is her preference. Baby sucking and swallowing in burst. Infant is restless on the breast, unlatching and re-latching. She is having mild strider. MD came to room and assessed baby during feeding. O2 sat obtained 98% and infant did not show cyanosis.  Mother's milk is coming is coming to volume and milk transfer noted with bursts of audible swallows. Hand pump given with instructions. WIC pump request was sent to [facility] WIC. Instructed mother to breastfeed and watch baby for any signs of respiratory distress and call the doctor. Continue to supplement with formula or expressed milk after breast feeding.    Maternal Data Has patient been taught Hand Expression?: Yes Does the patient have breastfeeding experience prior to this delivery?: No  Feeding Mother's Current Feeding Choice: Breast Milk and Formula  LATCH Score Latch: Repeated attempts needed to sustain latch, nipple held in mouth throughout feeding, stimulation needed to elicit sucking reflex.  Audible Swallowing: Spontaneous and intermittent  Type of Nipple: Everted at rest and after stimulation  Comfort (Breast/Nipple): Soft /  non-tender  Hold (Positioning): Assistance needed to correctly position infant at breast and maintain latch.  LATCH Score: 8   Lactation Tools Discussed/Used Tools: Pump Breast pump type: Manual Pump Education: Milk Storage;Setup, frequency, and cleaning Reason for Pumping: given today for milk removal for feeding, prn use  Interventions Interventions: Assisted with latch;Breast feeding basics reviewed;Skin to skin;Breast massage;Hand express;Breast compression;Adjust position;Support pillows;Position options;Education;LC Services brochure  Discharge Discharge Education: Engorgement and breast care;Warning signs for feeding baby Pump: DEBP WIC Program: Yes  Consult Status  complete    Gwenevere Abbot 10/10/2022, 3:04 PM

## 2022-10-11 ENCOUNTER — Encounter: Payer: Medicaid Other | Admitting: Family Medicine

## 2022-10-19 ENCOUNTER — Telehealth (HOSPITAL_COMMUNITY): Payer: Self-pay | Admitting: *Deleted

## 2022-10-19 NOTE — Telephone Encounter (Signed)
Left phone voicemail message.  Odis Hollingshead, RN 10-19-2022 at 11:00am

## 2022-10-25 ENCOUNTER — Ambulatory Visit (INDEPENDENT_AMBULATORY_CARE_PROVIDER_SITE_OTHER): Payer: Medicaid Other | Admitting: Family Medicine

## 2022-10-25 NOTE — Progress Notes (Signed)
Subjective:     Mary Zhang is a 32 y.o. female who presents to the clinic 2 weeks status post low uterine, transverse cesarean section. Pt reports incision is healing well.      Objective:    BP 96/67   Pulse 92   Ht 5' 1.02" (1.55 m)   Wt 159 lb (72.1 kg)   LMP 01/07/2022   Breastfeeding Yes   BMI 30.02 kg/m  General:  alert, well appearing, in no apparent distress  Incision:   healing well, no drainage, no erythema, no hernia, no seroma, no swelling, no dehiscence, incision well approximated     Assessment:    Doing well postoperatively.   Plan:    1. Continue any current medications. 2. Wound care discussed. 3. Follow up: 2 weeks for PP visit.   Truett Mainland, DO

## 2022-11-12 ENCOUNTER — Ambulatory Visit (INDEPENDENT_AMBULATORY_CARE_PROVIDER_SITE_OTHER): Payer: Medicaid Other | Admitting: Internal Medicine

## 2022-11-12 ENCOUNTER — Encounter: Payer: Self-pay | Admitting: Internal Medicine

## 2022-11-12 ENCOUNTER — Other Ambulatory Visit: Payer: Self-pay

## 2022-11-12 VITALS — BP 97/67 | HR 79 | Resp 16 | Ht 61.02 in | Wt 159.0 lb

## 2022-11-12 DIAGNOSIS — B20 Human immunodeficiency virus [HIV] disease: Secondary | ICD-10-CM | POA: Diagnosis present

## 2022-11-12 NOTE — Progress Notes (Signed)
RFV :Follow up for HIV disease  Patient ID: Mary Zhang, female   DOB: 1990-11-27, 32 y.o.   MRN: [id number]  HPI  [name] is a 32 y.o. G4P4 with an IUP [redacted]w[redacted]d presenting for scheduled cesarean on 10/07/2022. HIV VL < 20 on lab work from admission. Remains on triumeq. Baby girl doing well. Patient and baby not missing a dose. She has been successful on breastfeeding her infant. Baby remains on ART thorugh may 3rd.  Not missing doses. Poor sleep at times due to infant but otherwise doing well. Outpatient Encounter Medications as of 11/12/2022  Medication Sig   abacavir-dolutegravir-lamiVUDine (TRIUMEQ) 600-50-300 MG tablet Take 1 tablet by mouth daily.   Prenatal Vit-Fe Fumarate-FA (PRENATAL VITAMIN AND MINERAL) 28-0.8 MG TABS Take 1 tablet by mouth daily.   acetaminophen (TYLENOL) 500 MG tablet Take 2 tablets (1,000 mg total) by mouth every 8 (eight) hours as needed. (Patient not taking: Reported on 10/25/2022)   ibuprofen (ADVIL) 600 MG tablet Take 1 tablet (600 mg total) by mouth every 6 (six) hours. (Patient not taking: Reported on 10/25/2022)   oxyCODONE (OXY IR/ROXICODONE) 5 MG immediate release tablet Take 1-2 tablets (5-10 mg total) by mouth every 6 (six) hours as needed for moderate pain. (Patient not taking: Reported on 10/25/2022)   senna-docusate (SENOKOT-S) 8.6-50 MG tablet Take 2 tablets by mouth daily. (Patient not taking: Reported on 10/25/2022)   simethicone (MYLICON) 80 MG chewable tablet Chew 1 tablet (80 mg total) by mouth 3 (three) times daily after meals. (Patient not taking: Reported on 10/25/2022)   No facility-administered encounter medications on file as of 11/12/2022.     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  HENT: Negative for congestion, sore throat, rhinorrhea, sneezing, trouble swallowing and sinus pressure.  Eyes: Negative for photophobia and visual disturbance.  Respiratory: Negative for cough, chest tightness, shortness of breath, wheezing and stridor.  Cardiovascular: Negative for chest pain, palpitations and leg swelling.  Gastrointestinal: Negative for nausea, vomiting, abdominal pain, diarrhea, constipation, blood in stool, abdominal distention and anal bleeding.  Genitourinary: Negative for dysuria, hematuria, flank pain and difficulty urinating.  Musculoskeletal: Negative for myalgias, back pain, joint swelling, arthralgias and gait problem.  Skin: Negative for color change, pallor, rash and wound.  Neurological: Negative for dizziness, tremors, weakness and light-headedness.  Hematological: Negative for adenopathy. Does not bruise/bleed easily.  Psychiatric/Behavioral: Negative for behavioral problems, confusion, sleep disturbance, dysphoric mood, decreased concentration and agitation.   Physical Exam   Ht 5' 1.02" (1.55 m)   LMP 01/07/2022   BMI 30.02 kg/m   Physical Exam  Constitutional:  oriented to person, place, and time. appears well-developed and well-nourished. No distress.  HENT: [place]/AT, PERRLA, no scleral icterus Mouth/Throat: Oropharynx is clear and moist. No oropharyngeal exudate.  Cardiovascular: Normal rate, regular rhythm and normal heart sounds. Exam reveals no gallop and no friction rub.  No murmur heard.  Pulmonary/Chest: Effort normal and  breath sounds normal. No respiratory distress.  has no wheezes.  Neck = supple, no  nuchal rigidity Abdominal: Soft. Bowel sounds are normal.  exhibits no distension. There is no tenderness.  Lymphadenopathy: no cervical adenopathy. No axillary adenopathy Neurological: alert and oriented to person, place, and time.  Skin: Skin is warm and dry. No rash noted. No erythema.  Psychiatric: a normal mood and affect.  behavior is normal.   Lab Results  Component Value Date   CD4TCELL 34 05/17/2022   Lab Results  Component Value Date   CD4TABS 337 (L) 05/17/2022   CD4TABS 420 02/15/2022   CD4TABS 411 08/07/2021   Lab Results  Component Value Date   HIV1RNAQUANT <20 10/07/2022   No results found for: "HEPBSAB" Lab Results  Component Value Date   LABRPR NON REACTIVE 10/05/2022    CBC Lab Results  Component Value Date   WBC 8.6 10/08/2022   RBC 3.10 (L) 10/08/2022   HGB 10.7 (L) 10/08/2022   HCT 29.3 (L) 10/08/2022   PLT 109 (L) 10/08/2022   MCV 94.5 10/08/2022   MCH 34.5 (H) 10/08/2022   MCHC 36.5 (H) 10/08/2022   RDW 13.6 10/08/2022   LYMPHSABS 1,043 05/17/2022   MONOABS 384 02/12/2017   EOSABS 37 05/17/2022    BMET Lab Results  Component Value Date   NA 138 02/15/2022   K 3.5 02/15/2022   CL 105 02/15/2022   CO2 24 02/15/2022   GLUCOSE 97 02/15/2022   BUN 6 (L) 02/15/2022   CREATININE 0.46 (L) 02/15/2022   CALCIUM 9.5 02/15/2022   GFRNONAA >60 07/26/2020   GFRAA 163 02/22/2020      Assessment and Plan Hiv disease = currently on pregnancy medicaid through end of the month; we will Check to see there is no break in her insurance since breast feeding. We are able to extend pregnancy medicaid through 12 months per financial counselor. Gave info to husband to call their case worker to see if they qualify as a family. Will check HIV VL to ensure still undetectable  Has appt with gyn for 26th  And baby girl "eva" takes meds through may 3rd. Then repeats hiv testing at 95 months old   Breastfeeding = continue for now with EBF. Counseled that she  can't have any breaks with triumeq for which she hasn't missed doses in the past.  See back in 3 months  I have personally spent 30 minutes involved in face-to-face and non-face-to-face activities for this patient on the day of the visit. Professional time spent includes the following activities: Preparing to see the patient (review of tests), Obtaining and/or reviewing separately obtained history (admission/discharge record), Performing a medically appropriate examination and/or evaluation , Ordering medications/tests/procedures, referring and communicating with other health care professionals, Documenting clinical information in the EMR, Independently interpreting results (not separately reported), Communicating results to the patient/family/caregiver, Counseling and educating the patient/family/caregiver and Care coordination (not separately reported).

## 2022-11-15 ENCOUNTER — Ambulatory Visit (INDEPENDENT_AMBULATORY_CARE_PROVIDER_SITE_OTHER): Payer: Medicaid Other | Admitting: Family Medicine

## 2022-11-15 DIAGNOSIS — Z758 Other problems related to medical facilities and other health care: Secondary | ICD-10-CM

## 2022-11-15 DIAGNOSIS — Z603 Acculturation difficulty: Secondary | ICD-10-CM

## 2022-11-15 DIAGNOSIS — Z30017 Encounter for initial prescription of implantable subdermal contraceptive: Secondary | ICD-10-CM | POA: Diagnosis not present

## 2022-11-15 DIAGNOSIS — Z21 Asymptomatic human immunodeficiency virus [HIV] infection status: Secondary | ICD-10-CM | POA: Diagnosis not present

## 2022-11-15 DIAGNOSIS — Z3202 Encounter for pregnancy test, result negative: Secondary | ICD-10-CM | POA: Diagnosis not present

## 2022-11-15 DIAGNOSIS — R220 Localized swelling, mass and lump, head: Secondary | ICD-10-CM

## 2022-11-15 LAB — HIV-1 RNA QUANT-NO REFLEX-BLD
HIV 1 RNA Quant: NOT DETECTED Copies/mL
HIV-1 RNA Quant, Log: NOT DETECTED Log cps/mL

## 2022-11-15 LAB — POCT URINE PREGNANCY: Preg Test, Ur: NEGATIVE

## 2022-11-15 MED ORDER — PREPLUS 27-1 MG PO TABS: 1.0000 | ORAL_TABLET | Freq: Every day | ORAL | 6 refills | Status: DC

## 2022-11-15 MED ORDER — ETONOGESTREL 68 MG ~~LOC~~ IMPL
68.0000 mg | DRUG_IMPLANT | Freq: Once | SUBCUTANEOUS | Status: AC
  Administered 2022-11-15: 68 mg via SUBCUTANEOUS

## 2022-11-15 MED ORDER — PREPLUS 27-1 MG PO TABS: 1.0000 | ORAL_TABLET | Freq: Every day | ORAL | 3 refills | Status: DC

## 2022-11-15 NOTE — Progress Notes (Signed)
Post Partum Visit Note  Mary Zhang is a 32 y.o. [phone] female who presents for a postpartum visit. She is 5 weeks postpartum following a primary cesarean section.  I have fully reviewed the prenatal and intrapartum course. The delivery was at 39 gestational weeks.  Anesthesia: spinal. Postpartum course has been normal. Baby is doing well. Baby is feeding by breast. Bleeding no bleeding. Bowel function is normal. Bladder function is normal. Patient is not sexually active. Contraception method is Nexplanon. Postpartum depression screening: negative.  Has noticed a lump in her left cheek, which has been bothering her. No redness to the area. No fevers. Slightly tender.  Upstream - 11/15/22 1039       Pregnancy Intention Screening   Does the patient want to become pregnant in the next year? No    Does the patient's partner want to become pregnant in the next year? No    Would the patient like to discuss contraceptive options today? Yes      Contraception Wrap Up   Current Method No Method - Other Reason    End Method Hormonal Implant    Contraception Counseling Provided Yes    How was the end contraceptive method provided? Provided on site            The pregnancy intention screening data noted above was reviewed. Potential methods of contraception were discussed. The patient elected to proceed with Hormonal Implant.   Edinburgh Postnatal Depression Scale - 11/15/22 1041       Edinburgh Postnatal Depression Scale:  In the Past 7 Days   I have been able to laugh and see the funny side of things. 0    I have looked forward with enjoyment to things. 0    I have blamed myself unnecessarily when things went wrong. 0    I have been anxious or worried for no good reason. 0    I have felt scared or panicky for no good reason. 0    Things have been getting on top of me. 0    I have been so unhappy that I have had difficulty sleeping. 0    I have felt sad or miserable. 0    I have been  so unhappy that I have been crying. 0    The thought of harming myself has occurred to me. 0    Edinburgh Postnatal Depression Scale Total 0             Health Maintenance Due  Topic Date Due   COVID-19 Vaccine (3 - Pfizer risk series) 07/29/2020    The following portions of the patient's history were reviewed and updated as appropriate: allergies, current medications, past family history, past medical history, past social history, past surgical history, and problem list.  Review of Systems Pertinent items are noted in HPI.  Objective:  BP 95/63   Pulse 81   Wt 160 lb (72.6 kg)   LMP 10/17/2022 (Approximate) Comment: Just delivered baby 10/07/22  BMI 30.21 kg/m    General:  alert, cooperative, and no distress  HEENT: 1cm mobile firm lump in the left cheek   Breasts:  not indicated  Lungs: clear to auscultation bilaterally  Heart:  regular rate and rhythm, S1, S2 normal, no murmur, click, rub or gallop  Abdomen: soft, non-tender; bowel sounds normal; no masses,  no organomegaly   Wound well approximated incision  GU exam:  not indicated       Assessment:   1.  Postpartum care and examination  2. Lactating mother - Prenatal Vit-Fe Fumarate-FA (PREPLUS) 27-1 MG TABS; Take 1 tablet by mouth daily.  Dispense: 30 tablet; Refill: 6  3. Language barrier Interpreter present  4. Asymptomatic HIV infection, with no history of HIV-related illness On antvirals  5. Encounter for initial prescription of Nexplanon  6. Cheek mass Referral to ENT.   Plan:   Essential components of care per ACOG recommendations:  1.  Mood and well being: Patient with negative depression screening today. Reviewed local resources for support.  - Patient tobacco use? No.   - hx of drug use? No.    2. Infant care and feeding:  -Patient currently breastmilk feeding? Yes. Reviewed importance of draining breast regularly to support lactation.  -Social determinants of health (SDOH) reviewed in  EPIC. No concerns  3. Sexuality, contraception and birth spacing - Patient does not want a pregnancy in the next year.  - Reviewed reproductive life planning. Reviewed contraceptive methods based on pt preferences and effectiveness.  Patient desired Hormonal Implant today.   - Discussed birth spacing of 18 months  4. Sleep and fatigue -Encouraged family/partner/community support of 4 hrs of uninterrupted sleep to help with mood and fatigue  5. Physical Recovery  - Discussed patients delivery and complications. She describes her labor as good. - Patient had a C-section for breech.  - Patient has urinary incontinence? No. - Patient is safe to resume physical and sexual activity  6.  Health Maintenance - HM due items addressed Yes - Last pap smear  Diagnosis  Date Value Ref Range Status  03/28/2022   Final   - Negative for intraepithelial lesion or malignancy (NILM)   Pap smear not done at today's visit.  -Breast Cancer screening indicated? No.   7. Chronic Disease/Pregnancy Condition follow up: None  - PCP follow up  Levie Heritage, DO [facility], [facility]

## 2022-11-15 NOTE — Addendum Note (Signed)
Addended by: Lorelle Gibbs L on: 11/15/2022 11:40 AM   Modules accepted: Orders

## 2022-11-15 NOTE — Progress Notes (Signed)
Nexplanon Insertion:  Patient given informed consent, signed copy in the chart, time out was performed. Pregnancy test was negative. Appropriate time out taken.  Patient's left arm was prepped and draped in the usual sterile fashion.. The ruler used to measure and mark insertion area.  Pt was prepped with alcohol swab and then injected with 6 cc of 2% lidocaine with epinephrine.  Pt was prepped with betadine, Implanon removed form packaging,  Device confirmed in needle, then inserted full length of needle and withdrawn per handbook instructions.  Device palpated by physician and patient.  Pt insertion site covered with pressure dressing.   Minimal blood loss.  Pt tolerated the procedure well.

## 2023-02-18 ENCOUNTER — Ambulatory Visit: Payer: Medicaid Other | Admitting: Internal Medicine

## 2023-02-27 ENCOUNTER — Encounter: Payer: Self-pay | Admitting: Internal Medicine

## 2023-02-27 ENCOUNTER — Ambulatory Visit (INDEPENDENT_AMBULATORY_CARE_PROVIDER_SITE_OTHER): Payer: Medicaid Other | Admitting: Internal Medicine

## 2023-02-27 ENCOUNTER — Other Ambulatory Visit: Payer: Self-pay

## 2023-02-27 VITALS — BP 100/69 | HR 89 | Temp 97.8°F | Wt 160.0 lb

## 2023-02-27 DIAGNOSIS — B20 Human immunodeficiency virus [HIV] disease: Secondary | ICD-10-CM

## 2023-02-27 DIAGNOSIS — Z79899 Other long term (current) drug therapy: Secondary | ICD-10-CM

## 2023-02-27 LAB — CBC WITH DIFFERENTIAL/PLATELET
Absolute Monocytes: 429 cells/uL (ref 200–950)
Basophils Absolute: 12 cells/uL (ref 0–200)
Basophils Relative: 0.2 %
Eosinophils Absolute: 151 cells/uL (ref 15–500)
Eosinophils Relative: 2.6 %
HCT: 37.3 % (ref 35.0–45.0)
Hemoglobin: 13 g/dL (ref 11.7–15.5)
Lymphs Abs: 1085 cells/uL (ref 850–3900)
MCH: 31.8 pg (ref 27.0–33.0)
MCHC: 34.9 g/dL (ref 32.0–36.0)
MCV: 91.2 fL (ref 80.0–100.0)
MPV: 10.9 fL (ref 7.5–12.5)
Monocytes Relative: 7.4 %
Neutro Abs: 4124 cells/uL (ref 1500–7800)
Neutrophils Relative %: 71.1 %
Platelets: 178 10*3/uL (ref 140–400)
RBC: 4.09 10*6/uL (ref 3.80–5.10)
RDW: 12.7 % (ref 11.0–15.0)
Total Lymphocyte: 18.7 %
WBC: 5.8 10*3/uL (ref 3.8–10.8)

## 2023-02-27 NOTE — Progress Notes (Signed)
RFV: follow up for hiv disease  Patient ID: Mary Zhang, female   DOB: 1990-12-01, 32 y.o.   MRN: [id number]  HPI Mary Zhang is a 32yo F with hiv disease, on triumeq from pregnancy. She continues to do exclusive breast feeding with her newborn. Overall doing well.  Outpatient Encounter Medications as of 02/27/2023  Medication Sig   abacavir-dolutegravir-lamiVUDine (TRIUMEQ) 600-50-300 MG tablet Take 1 tablet by mouth daily.   Prenatal Vit-Fe Fumarate-FA (PREPLUS) 27-1 MG TABS Take 1 tablet by mouth daily.   No facility-administered encounter medications on file as of 02/27/2023.     Patient Active Problem List   Diagnosis Date Noted   Cesarean delivery delivered 10/07/2022   Supervision of other high risk pregnancy, antepartum 03/28/2022   HIV infection (HCC) 08/26/2020   Tinea 06/28/2020   History of preterm delivery 01/05/2020   Language barrier 01/22/2019   Benign gestational thrombocytopenia, antepartum (HCC) 01/22/2019   Lead exposure 12/16/2015     Health Maintenance Due  Topic Date Due   COVID-19 Vaccine (3 - Pfizer risk series) 07/29/2020   INFLUENZA VACCINE  02/21/2023     Review of Systems Review of Systems  Constitutional: Negative for fever, chills, diaphoresis, activity change, appetite change, fatigue and unexpected weight change.  HENT: Negative for congestion, sore throat, rhinorrhea, sneezing, trouble swallowing and sinus pressure.  Eyes: Negative for photophobia and visual disturbance.  Respiratory: Negative for cough, chest tightness, shortness of breath, wheezing and stridor.  Cardiovascular: Negative for chest pain, palpitations and leg swelling.  Gastrointestinal: Negative for nausea, vomiting, abdominal pain, diarrhea, constipation, blood in stool, abdominal distention and anal bleeding.  Genitourinary: Negative for dysuria, hematuria, flank pain and difficulty urinating.  Musculoskeletal: Negative for myalgias, back pain, joint swelling, arthralgias and  gait problem.  Skin: Negative for color change, pallor, rash and wound.  Neurological: Negative for dizziness, tremors, weakness and light-headedness.  Hematological: Negative for adenopathy. Does not bruise/bleed easily.  Psychiatric/Behavioral: Negative for behavioral problems, confusion, sleep disturbance, dysphoric mood, decreased concentration and agitation.   Physical Exam   BP 100/69   Pulse 89   Temp 97.8 F (36.6 C) (Temporal)   Wt 160 lb (72.6 kg)   SpO2 99%   BMI 30.21 kg/m   Physical Exam  Constitutional:  oriented to person, place, and time. appears well-developed and well-nourished. No distress.  HENT: [place]/AT, PERRLA, no scleral icterus Mouth/Throat: Oropharynx is clear and moist. No oropharyngeal exudate.  Cardiovascular: Normal rate, regular rhythm and normal heart sounds. Exam reveals no gallop and no friction rub.  No murmur heard.  Pulmonary/Chest: Effort normal and breath sounds normal. No respiratory distress.  has no wheezes.  Neck = supple, no nuchal rigidity Abdominal: Soft. Bowel sounds are normal.  exhibits no distension. There is no tenderness.  Lymphadenopathy: no cervical adenopathy. No axillary adenopathy Neurological: alert and oriented to person, place, and time.  Skin: Skin is warm and dry. No rash noted. No erythema.  Psychiatric: a normal mood and affect.  behavior is normal.   Lab Results  Component Value Date   CD4TCELL 34 05/17/2022   Lab Results  Component Value Date   CD4TABS 337 (L) 05/17/2022   CD4TABS 420 02/15/2022   CD4TABS 411 08/07/2021   Lab Results  Component Value Date   HIV1RNAQUANT Not Detected 11/12/2022   No results found for: "HEPBSAB" Lab Results  Component Value Date   LABRPR NON REACTIVE 10/05/2022    CBC Lab Results  Component Value Date   WBC 8.6  10/08/2022   RBC 3.10 (L) 10/08/2022   HGB 10.7 (L) 10/08/2022   HCT 29.3 (L) 10/08/2022   PLT 109 (L) 10/08/2022   MCV 94.5 10/08/2022   MCH 34.5 (H)  10/08/2022   MCHC 36.5 (H) 10/08/2022   RDW 13.6 10/08/2022   LYMPHSABS 1,043 05/17/2022   MONOABS 384 02/12/2017   EOSABS 37 05/17/2022    BMET Lab Results  Component Value Date   NA 138 02/15/2022   K 3.5 02/15/2022   CL 105 02/15/2022   CO2 24 02/15/2022   GLUCOSE 97 02/15/2022   BUN 6 (L) 02/15/2022   CREATININE 0.46 (L) 02/15/2022   CALCIUM 9.5 02/15/2022   GFRNONAA >60 07/26/2020   GFRAA 163 02/22/2020      Assessment and Plan  HIV disease = labs are well controlled from pregnancy/delivery. We will check VL. Continue on triumeq  Exclusive breast feeding = recommend to continue; once she is deciding to introduce formula or solid foods then need to stop breast feeding entirely rather than weaning per guidelines in hiv + mothers and EBF.   Long term medication management= cr is stable  See back in 2 months - will also give flu and covid vaccine at that time.  I have personally spent 35  minutes involved in face-to-face and non-face-to-face activities for this patient on the day of the visit. Professional time spent includes the following activities: Preparing to see the patient (review of tests), Obtaining and/or reviewing separately obtained history (admission/discharge record), Performing a medically appropriate examination and/or evaluation , Ordering medications/tests/procedures, referring and communicating with other health care professionals, Documenting clinical information in the EMR, Independently interpreting results (not separately reported), Communicating results to the patient/family/caregiver, Counseling and educating the patient/family/caregiver and Care coordination (not separately reported).

## 2023-05-06 ENCOUNTER — Encounter: Payer: Self-pay | Admitting: Internal Medicine

## 2023-05-06 ENCOUNTER — Ambulatory Visit (INDEPENDENT_AMBULATORY_CARE_PROVIDER_SITE_OTHER): Payer: Medicaid Other | Admitting: Internal Medicine

## 2023-05-06 ENCOUNTER — Other Ambulatory Visit: Payer: Self-pay

## 2023-05-06 VITALS — BP 106/71 | HR 84 | Temp 98.1°F | Wt 152.0 lb

## 2023-05-06 DIAGNOSIS — B20 Human immunodeficiency virus [HIV] disease: Secondary | ICD-10-CM

## 2023-05-06 DIAGNOSIS — Z23 Encounter for immunization: Secondary | ICD-10-CM

## 2023-05-06 NOTE — Progress Notes (Signed)
Patient ID: Mary Zhang, female   DOB: [birth year], 32 y.o.   MRN: [id number]  HPI 32yo F with HIV disease, well controlled for several years. and currently Breastfeeding, but also now starting to eat wheat, yogurt, and - almost 7 months old How many times a day is she breastfeeding  4-5 x a day  Doing well with triumeq   Outpatient Encounter Medications as of 05/06/2023  Medication Sig   abacavir-dolutegravir-lamiVUDine (TRIUMEQ) 600-50-300 MG tablet Take 1 tablet by mouth daily.   Prenatal Vit-Fe Fumarate-FA (PREPLUS) 27-1 MG TABS Take 1 tablet by mouth daily.   No facility-administered encounter medications on file as of 05/06/2023.     Patient Active Problem List   Diagnosis Date Noted   Cesarean delivery delivered 10/07/2022   Supervision of other high risk pregnancy, antepartum 03/28/2022   HIV infection (HCC) 08/26/2020   Tinea 06/28/2020   History of preterm delivery 01/05/2020   Language barrier 01/22/2019   Benign gestational thrombocytopenia, antepartum (HCC) 01/22/2019   Lead exposure 12/16/2015     Health Maintenance Due  Topic Date Due   COVID-19 Vaccine (3 - Pfizer risk series) 07/29/2020   INFLUENZA VACCINE  02/21/2023     Review of Systems Review of Systems  Constitutional: Negative for fever, chills, diaphoresis, activity change, appetite change, fatigue and unexpected weight change.  HENT: Negative for congestion, sore throat, rhinorrhea, sneezing, trouble swallowing and sinus pressure.  Eyes: Negative for photophobia and visual disturbance.  Respiratory: Negative for cough, chest tightness, shortness of breath, wheezing and stridor.  Cardiovascular: Negative for chest pain, palpitations and leg swelling.  Gastrointestinal: Negative for nausea, vomiting, abdominal pain, diarrhea, constipation, blood in stool, abdominal distention and anal bleeding.  Genitourinary: Negative for dysuria, hematuria, flank pain and difficulty urinating.   Musculoskeletal: Negative for myalgias, back pain, joint swelling, arthralgias and gait problem.  Skin: Negative for color change, pallor, rash and wound.  Neurological: Negative for dizziness, tremors, weakness and light-headedness.  Hematological: Negative for adenopathy. Does not bruise/bleed easily.  Psychiatric/Behavioral: Negative for behavioral problems, confusion, sleep disturbance, dysphoric mood, decreased concentration and agitation.   Physical Exam   BP 106/71   Pulse 84   Temp 98.1 F (36.7 C) (Oral)   Wt 152 lb (68.9 kg)   SpO2 98%   BMI 28.70 kg/m   Physical Exam  Constitutional:  oriented to person, place, and time. appears well-developed and well-nourished. No distress.  HENT: [place]/AT, PERRLA, no scleral icterus Mouth/Throat: Oropharynx is clear and moist. No oropharyngeal exudate.  Cardiovascular: Normal rate, regular rhythm and normal heart sounds. Exam reveals no gallop and no friction rub.  No murmur heard.  Pulmonary/Chest: Effort normal and breath sounds normal. No respiratory distress.  has no wheezes.  Neck = supple, no nuchal rigidity Abdominal: Soft. Bowel sounds are normal.  exhibits no distension. There is no tenderness.  Lymphadenopathy: no cervical adenopathy. No axillary adenopathy Neurological: alert and oriented to person, place, and time.  Skin: Skin is warm and dry. No rash noted. No erythema.  Psychiatric: a normal mood and affect.  behavior is normal.   Lab Results  Component Value Date   CD4TCELL 36 02/27/2023   Lab Results  Component Value Date   CD4TABS 337 (L) 05/17/2022   CD4TABS 420 02/15/2022   CD4TABS 411 08/07/2021   Lab Results  Component Value Date   HIV1RNAQUANT Not Detected 02/27/2023   No results found for: "HEPBSAB" Lab Results  Component Value Date   LABRPR NON REACTIVE  10/05/2022    CBC Lab Results  Component Value Date   WBC 5.8 02/27/2023   RBC 4.09 02/27/2023   HGB 13.0 02/27/2023   HCT 37.3 02/27/2023    PLT 178 02/27/2023   MCV 91.2 02/27/2023   MCH 31.8 02/27/2023   MCHC 34.9 02/27/2023   RDW 12.7 02/27/2023   LYMPHSABS 1,085 02/27/2023   MONOABS 384 02/12/2017   EOSABS 151 02/27/2023    BMET Lab Results  Component Value Date   NA 140 02/27/2023   K 3.8 02/27/2023   CL 107 02/27/2023   CO2 25 02/27/2023   GLUCOSE 91 02/27/2023   BUN 12 02/27/2023   CREATININE 0.56 02/27/2023   CALCIUM 9.5 02/27/2023   GFRNONAA >60 07/26/2020   GFRAA 163 02/22/2020      Assessment and Plan HIV disease = will check VL today and also when we see back in 2 months; refill triumeq  Long term medication management = will check cr  Breastfeeding = discussed with her when she will stop breastfeeding.   Needs pcp in [place]

## 2023-05-07 LAB — T-HELPER CELL (CD4) - (RCID CLINIC ONLY)
CD4 % Helper T Cell: 32 % — ABNORMAL LOW (ref 33–65)
CD4 T Cell Abs: 451 /uL (ref 400–1790)

## 2023-05-08 LAB — COMPLETE METABOLIC PANEL WITH GFR
AG Ratio: 1.7 (calc) (ref 1.0–2.5)
ALT: 14 U/L (ref 6–29)
AST: 12 U/L (ref 10–30)
Albumin: 4.8 g/dL (ref 3.6–5.1)
Alkaline phosphatase (APISO): 65 U/L (ref 31–125)
BUN: 11 mg/dL (ref 7–25)
CO2: 26 mmol/L (ref 20–32)
Calcium: 10 mg/dL (ref 8.6–10.2)
Chloride: 105 mmol/L (ref 98–110)
Creat: 0.56 mg/dL (ref 0.50–0.97)
Globulin: 2.8 g/dL (ref 1.9–3.7)
Glucose, Bld: 96 mg/dL (ref 65–99)
Potassium: 3.7 mmol/L (ref 3.5–5.3)
Sodium: 140 mmol/L (ref 135–146)
Total Bilirubin: 0.9 mg/dL (ref 0.2–1.2)
Total Protein: 7.6 g/dL (ref 6.1–8.1)
eGFR: 126 mL/min/{1.73_m2} (ref >=60)

## 2023-05-08 LAB — CBC WITH DIFFERENTIAL/PLATELET
Absolute Monocytes: 320 {cells}/uL (ref 200–950)
Basophils Absolute: 20 {cells}/uL (ref 0–200)
Basophils Relative: 0.4 %
Eosinophils Absolute: 60 {cells}/uL (ref 15–500)
Eosinophils Relative: 1.2 %
HCT: 41.3 % (ref 35.0–45.0)
Hemoglobin: 14.3 g/dL (ref 11.7–15.5)
Lymphs Abs: 1510 {cells}/uL (ref 850–3900)
MCH: 32.3 pg (ref 27.0–33.0)
MCHC: 34.6 g/dL (ref 32.0–36.0)
MCV: 93.2 fL (ref 80.0–100.0)
MPV: 10.9 fL (ref 7.5–12.5)
Monocytes Relative: 6.4 %
Neutro Abs: 3090 {cells}/uL (ref 1500–7800)
Neutrophils Relative %: 61.8 %
Platelets: 211 10*3/uL (ref 140–400)
RBC: 4.43 10*6/uL (ref 3.80–5.10)
RDW: 12.6 % (ref 11.0–15.0)
Total Lymphocyte: 30.2 %
WBC: 5 10*3/uL (ref 3.8–10.8)

## 2023-05-08 LAB — RPR: RPR Ser Ql: NONREACTIVE

## 2023-05-08 LAB — HIV-1 RNA QUANT-NO REFLEX-BLD
HIV 1 RNA Quant: NOT DETECTED {copies}/mL
HIV-1 RNA Quant, Log: NOT DETECTED {Log}

## 2023-06-12 ENCOUNTER — Emergency Department (HOSPITAL_BASED_OUTPATIENT_CLINIC_OR_DEPARTMENT_OTHER)
Admission: EM | Admit: 2023-06-12 | Discharge: 2023-06-12 | Disposition: A | Discharge status: Home / Self Care | Payer: Medicaid Other

## 2023-06-12 ENCOUNTER — Other Ambulatory Visit: Payer: Self-pay

## 2023-06-12 DIAGNOSIS — H9203 Otalgia, bilateral: Secondary | ICD-10-CM | POA: Diagnosis present

## 2023-06-12 DIAGNOSIS — Z21 Asymptomatic human immunodeficiency virus [HIV] infection status: Secondary | ICD-10-CM | POA: Diagnosis not present

## 2023-06-12 MED ORDER — CEFDINIR 300 MG PO CAPS: 300.0000 mg | ORAL_CAPSULE | Freq: Two times a day (BID) | ORAL | 0 refills | Status: DC

## 2023-06-12 MED ORDER — DEXAMETHASONE 0.1 % OP SUSP: 2.0000 [drp] | Freq: Two times a day (BID) | OPHTHALMIC | 0 refills | Status: AC

## 2023-06-12 MED ORDER — CIPROFLOXACIN HCL 0.3 % OP SOLN
2.0000 [drp] | Freq: Two times a day (BID) | OPHTHALMIC | 0 refills | Status: AC
Start: 2023-06-12 — End: 2023-06-19

## 2023-06-12 NOTE — ED Triage Notes (Addendum)
Pt reports to the ED with complaints of right ear pain that started 5 days ago. Denies fever. States that she is also having some throat pain and headache

## 2023-06-12 NOTE — ED Notes (Signed)

## 2023-06-12 NOTE — Discharge Instructions (Signed)
As discussed, we will treat your symptoms with 2 different eardrops to place in your ears twice daily for the next 7 days.  Will also send an antibiotic to take by mouth.  Recommend not putting anything in your ear including Q-tip as this can worsen infection.  Will recommend follow-up with primary care for reassessment over the next 2 to 3 days.  Please do not hesitate to return to the emergency department if the worrisome signs and symptoms we discussed become apparent.

## 2023-06-12 NOTE — ED Provider Notes (Signed)
[place] [facility] Provider Note   CSN: [id number] Arrival date & time: 06/12/23  1519     History  Chief Complaint  Patient presents with   Ear Pain    Mary Zhang is a 32 y.o. female.  HPI   32 year old female presents emergency department complaints of bilateral ear pain with right greater than left.  States that right ear has been bothering her for the past week or 2.  Has reported some drainage from ear.  Has tried putting a Q-tip in the ear to help clean it out but seems to not be helping.  Denies any fever, sore throat, nasal congestion.  Denies any history of similar symptoms in the past.  Past medical history significant for HIV  Home Medications Prior to Admission medications   Medication Sig Start Date End Date Taking? Authorizing Provider  cefdinir (OMNICEF) 300 MG capsule Take 1 capsule (300 mg total) by mouth 2 (two) times daily. 06/12/23  Yes Sherian Maroon A, PA  ciprofloxacin (CILOXAN) 0.3 % ophthalmic solution Place 2 drops into both ears 2 (two) times daily for 7 days. Administer 1 drop, every 2 hours, while awake, for 2 days. Then 1 drop, every 4 hours, while awake, for the next 5 days. 06/12/23 06/19/23 Yes Sherian Maroon A, PA  dexamethasone (DECADRON) 0.1 % ophthalmic suspension Place 2 drops into both ears 2 (two) times daily for 7 days. 06/12/23 06/19/23 Yes Sherian Maroon A, PA  abacavir-dolutegravir-lamiVUDine (TRIUMEQ) 600-50-300 MG tablet Take 1 tablet by mouth daily. 08/13/22   Judyann Munson, MD  Prenatal Vit-Fe Fumarate-FA (PREPLUS) 27-1 MG TABS Take 1 tablet by mouth daily. 11/15/22   Levie Heritage, DO      Allergies    Patient has no known allergies.    Review of Systems   Review of Systems  All other systems reviewed and are negative.   Physical Exam Updated Vital Signs BP 111/76   Pulse 86   Temp 97.9 F (36.6 C) (Oral)   Resp 18   Ht 5\' 6"  (1.676 m)   Wt 74.8 kg   LMP 06/02/2023 Comment:  Pt is breastfeeding- very irregular.  SpO2 100%   Breastfeeding Yes   BMI 26.63 kg/m  Physical Exam Vitals and nursing note reviewed.  Constitutional:      General: She is not in acute distress.    Appearance: She is well-developed.  HENT:     Head: Normocephalic and atraumatic.     Ears:     Comments: Patient with maceration appreciated bilateral external auditory canal.  Left-sided TM without evidence of erythema or purulent drainage.  Right sided external auditory canal very inflamed with opaque serous drainage.  Minimal visualization of right sided TM with slight erythema and without evidence of obvious perforation.  External ear without erythema, induration or tenderness to palpation. Eyes:     Conjunctiva/sclera: Conjunctivae normal.  Cardiovascular:     Rate and Rhythm: Normal rate and regular rhythm.     Heart sounds: No murmur heard. Pulmonary:     Effort: Pulmonary effort is normal. No respiratory distress.     Breath sounds: Normal breath sounds.  Abdominal:     Palpations: Abdomen is soft.     Tenderness: There is no abdominal tenderness.  Musculoskeletal:        General: No swelling.     Cervical back: Neck supple.  Skin:    General: Skin is warm and dry.     Capillary  Refill: Capillary refill takes less than 2 seconds.  Neurological:     Mental Status: She is alert.  Psychiatric:        Mood and Affect: Mood normal.     ED Results / Procedures / Treatments   Labs (all labs ordered are listed, but only abnormal results are displayed) Labs Reviewed - No data to display  EKG None  Radiology No results found.  Procedures Procedures    Medications Ordered in ED Medications - No data to display  ED Course/ Medical Decision Making/ A&P                                 Medical Decision Making Risk Prescription drug management.   This patient presents to the ED for concern of ear pain, this involves an extensive number of treatment options, and is  a complaint that carries with it a high risk of complications and morbidity.  The differential diagnosis includes otitis media, otitis externa, Ramsay Hunt syndrome, perforated TM, other, cellulitis   Co morbidities that complicate the patient evaluation  See HPI   Additional history obtained:  Additional history obtained from EMR External records from outside source obtained and reviewed including hospital records   Lab Tests:  N/a   Imaging Studies ordered:  N/a   Cardiac Monitoring: / EKG:  The patient was maintained on a cardiac monitor.  I personally viewed and interpreted the cardiac monitored which showed an underlying rhythm of: Sinus rhythm   Consultations Obtained:  N/a   Problem List / ED Course / Critical interventions / Medication management  Ear pain Reevaluation of the patient showed that the patient stayed the same I have reviewed the patients home medicines and have made adjustments as needed   Social Determinants of Health:  Denies tobacco, licit drug use   Test / Admission - Considered:  Your pain Vitals signs within normal range and stable throughout visit. 32 year old female presents emergency department with complaints of ear pain bilaterally with right greater than left.  On exam, patient with evidence of opaque serous drainage appreciated from right ear with maceration appreciated external canal on the right.  Erythematous appearing right sided TM of which was partially observable without obvious perforation.  No external cellulitic skin changes or vesicular lesions.  Similarly, left ear with maceration without any obvious drainage.  Patient symptoms most consistent with otitis externa with possible right-sided otitis media.  Will treat with topical antibiotic drops as well as oral antibiotics given concern for right-sided otitis media.  Will recommend follow-up with primary care for reassessment within 2 to 3 days.  Treatment plan discussed  at length with patient and she acknowledged understanding was agreeable to said plan.  Patient overall well-appearing, afebrile in no acute distress. Worrisome signs and symptoms were discussed with the patient, and the patient acknowledged understanding to return to the ED if noticed. Patient was stable upon discharge.          Final Clinical Impression(s) / ED Diagnoses Final diagnoses:  Otalgia of both ears    Rx / DC Orders ED Discharge Orders          Ordered    ciprofloxacin (CILOXAN) 0.3 % ophthalmic solution  2 times daily        06/12/23 1758    dexamethasone (DECADRON) 0.1 % ophthalmic suspension  2 times daily        06/12/23 1758  cefdinir (OMNICEF) 300 MG capsule  2 times daily        06/12/23 1758              Peter Garter, [place] 06/12/23 1918    Durwin Glaze, MD 06/13/23 [phone]

## 2023-06-27 ENCOUNTER — Encounter: Payer: Self-pay | Admitting: Family

## 2023-06-27 ENCOUNTER — Ambulatory Visit (HOSPITAL_BASED_OUTPATIENT_CLINIC_OR_DEPARTMENT_OTHER)
Admission: RE | Admit: 2023-06-27 | Discharge: 2023-06-27 | Disposition: A | Discharge status: Home / Self Care | Payer: Medicaid Other | Source: Ambulatory Visit | Attending: Family | Admitting: Family

## 2023-06-27 ENCOUNTER — Ambulatory Visit: Payer: Medicaid Other | Admitting: Family

## 2023-06-27 VITALS — BP 112/64 | HR 81 | Resp 16 | Ht 66.0 in | Wt 153.6 lb

## 2023-06-27 DIAGNOSIS — M545 Low back pain, unspecified: Secondary | ICD-10-CM | POA: Insufficient documentation

## 2023-06-27 DIAGNOSIS — G8929 Other chronic pain: Secondary | ICD-10-CM

## 2023-06-27 NOTE — Progress Notes (Signed)
Mary Zhang is a 32 y.o. female with the following history as recorded in Epic[facility]:  Patient Active Problem List   Diagnosis Date Noted   Cesarean delivery delivered 10/07/2022   Supervision of other high risk pregnancy, antepartum 03/28/2022   HIV infection (HCC) 08/26/2020   Tinea 06/28/2020   History of preterm delivery 01/05/2020   Language barrier 01/22/2019   Benign gestational thrombocytopenia, antepartum (HCC) 01/22/2019   Lead exposure 12/16/2015    Current Outpatient Medications  Medication Sig Dispense Refill   abacavir-dolutegravir-lamiVUDine (TRIUMEQ) 600-50-300 MG tablet Take 1 tablet by mouth daily. 30 tablet 11   cefdinir (OMNICEF) 300 MG capsule Take 1 capsule (300 mg total) by mouth 2 (two) times daily. 20 capsule 0   Prenatal Vit-Fe Fumarate-FA (PREPLUS) 27-1 MG TABS Take 1 tablet by mouth daily. 90 tablet 3   No current facility-administered medications for this visit.    Allergies: Patient has no known allergies.  Past Medical History:  Diagnosis Date   Gestational thrombocytopenia (HCC)    HIV disease (HCC) 09/30/2015   HIV infection (HCC) 08/26/2020   Low blood pressure, not hypotension 01/02/2016   Menorrhagia    Raised antibody titer 10/25/2015   High toxoplasma gondi antibody titer   Shingles     Past Surgical History:  Procedure Laterality Date   CESAREAN SECTION N/A 10/07/2022   Procedure: CESAREAN SECTION;  Surgeon: Lazaro Arms, MD;  Location: [facility] LD ORS;  Service: Obstetrics;  Laterality: N/A;   NO PAST SURGERIES      Family History  Problem Relation Age of Onset   Hypertension Neg Hx    Diabetes Neg Hx     Social History   Tobacco Use   Smoking status: Never   Smokeless tobacco: Never  Substance Use Topics   Alcohol use: No    Subjective:   Patient presents today as a new patient; visit performed with help of interpreter;  Symptoms present "on and off" x 3 years; notes that symptoms have been present x 2 weeks; per patient, she may  have one episode of back pain per year;  Has been using OTC TENS until with some relief; notes that she may have an episode at least once per year;  is currently breast-feeding and prefers not to take extra medication;   Objective:  Vitals:   06/27/23 0908  BP: 112/64  Pulse: 81  Resp: 16  SpO2: 98%  Weight: 153 lb 9.6 oz (69.7 kg)  Height: 5\' 6"  (1.676 m)    General: Well developed, well nourished, in no acute distress  Skin : Warm and dry.  Head: Normocephalic and atraumatic  Lungs: Respirations unlabored; clear to auscultation bilaterally without wheeze, rales, rhonchi  CVS exam: normal rate and regular rhythm.  Abdomen: Soft; nontender; nondistended; normoactive bowel sounds; no masses or hepatosplenomegaly  Musculoskeletal: No deformities; no active joint inflammation  Extremities: No edema, cyanosis, clubbing  Vessels: Symmetric bilaterally  Neurologic: Alert and oriented; speech intact; face symmetrical; moves all extremities well; CNII-XII intact without focal deficit   Assessment:  1. Chronic low back pain, unspecified back pain laterality, unspecified whether sciatica present     Plan:  Due to patient currently breast-feeding, she is encouraged to take OTC Tylenol or Ibuprofen; continue her OTC TENS unit; will update lumbar X-ray today; order updated for PT- discussed how this will benefit her back and core strength; will also update referral to orthopedist due chronic nature of back issues.   Continue with care through GYN  and ID as well;   No follow-ups on file.  Orders Placed This Encounter  Procedures   DG Lumbar Spine Complete    Standing Status:   Future    Number of Occurrences:   1    Standing Expiration Date:   06/26/2024    Order Specific Question:   Reason for Exam (SYMPTOM  OR DIAGNOSIS REQUIRED)    Answer:   low back pain    Order Specific Question:   Is patient pregnant?    Answer:   No    Order Specific Question:   Preferred imaging location?     Answer:   [facility]   Ambulatory referral to Orthopedic Surgery    Referral Priority:   Routine    Referral Type:   Surgical    Referral Reason:   Specialty Services Required    Requested Specialty:   Orthopedic Surgery    Number of Visits Requested:   1   Ambulatory referral to Physical Therapy    Referral Priority:   Routine    Referral Type:   Physical Medicine    Referral Reason:   Specialty Services Required    Requested Specialty:   Physical Therapy    Number of Visits Requested:   1    Requested Prescriptions    No prescriptions requested or ordered in this encounter

## 2023-07-08 ENCOUNTER — Other Ambulatory Visit: Payer: Self-pay

## 2023-07-08 ENCOUNTER — Encounter: Payer: Self-pay | Admitting: Internal Medicine

## 2023-07-08 ENCOUNTER — Ambulatory Visit: Payer: Medicaid Other | Admitting: Internal Medicine

## 2023-07-08 VITALS — BP 102/60 | HR 93 | Wt 154.0 lb

## 2023-07-08 DIAGNOSIS — Z79899 Other long term (current) drug therapy: Secondary | ICD-10-CM | POA: Diagnosis not present

## 2023-07-08 DIAGNOSIS — Z7189 Other specified counseling: Secondary | ICD-10-CM | POA: Diagnosis not present

## 2023-07-08 DIAGNOSIS — B20 Human immunodeficiency virus [HIV] disease: Secondary | ICD-10-CM

## 2023-07-08 MED ORDER — TRIUMEQ 600-50-300 MG PO TABS: 1.0000 | ORAL_TABLET | Freq: Every day | ORAL | 11 refills | Status: DC

## 2023-07-08 NOTE — Progress Notes (Signed)
RFV: follow up for hiv disease  Patient ID: Mary Zhang, female   DOB: [birth year], 32 y.o.   MRN: [id number]  HPI Mary Zhang is a 32 yo F with hiv disease, Currently on triumeq Stlll breastfeeding her 8 months daugther - plans to breast feed up to 32 year old  Has nexplanon in place to last for 3 yrs for birth control Not missing doses of triumeq. Doing well.   Outpatient Encounter Medications as of 07/08/2023  Medication Sig   abacavir-dolutegravir-lamiVUDine (TRIUMEQ) 600-50-300 MG tablet Take 1 tablet by mouth daily.   cefdinir (OMNICEF) 300 MG capsule Take 1 capsule (300 mg total) by mouth 2 (two) times daily.   Prenatal Vit-Fe Fumarate-FA (PREPLUS) 27-1 MG TABS Take 1 tablet by mouth daily.   No facility-administered encounter medications on file as of 07/08/2023.     Patient Active Problem List   Diagnosis Date Noted   Cesarean delivery delivered 10/07/2022   Supervision of other high risk pregnancy, antepartum 03/28/2022   HIV infection (HCC) 08/26/2020   Tinea 06/28/2020   History of preterm delivery 01/05/2020   Language barrier 01/22/2019   Benign gestational thrombocytopenia, antepartum (HCC) 01/22/2019   Lead exposure 12/16/2015     Health Maintenance Due  Topic Date Due   COVID-19 Vaccine (4 - 2024-25 season) 07/01/2023     Review of Systems 12 point ros is negative Physical Exam   BP 102/60   Pulse 93   Wt 154 lb (69.9 kg)   LMP 06/02/2023 Comment: Pt is breastfeeding- very irregular.  SpO2 100%   BMI 24.86 kg/m   Physical Exam  Constitutional:  oriented to person, place, and time. appears well-developed and well-nourished. No distress.  HENT: [place]/AT, PERRLA, no scleral icterus Mouth/Throat: Oropharynx is clear and moist. No oropharyngeal exudate.  Cardiovascular: Normal rate, regular rhythm and normal heart sounds. Exam reveals no gallop and no friction rub.  No murmur heard.  Pulmonary/Chest: Effort normal and breath sounds normal. No respiratory  distress.  has no wheezes.  Neck = supple, no nuchal rigidity Abdominal: Soft. Bowel sounds are normal.  exhibits no distension. There is no tenderness.  Lymphadenopathy: no cervical adenopathy. No axillary adenopathy Neurological: alert and oriented to person, place, and time.  Skin: Skin is warm and dry. No rash noted. No erythema.  Psychiatric: a normal mood and affect.  behavior is normal.   Lab Results  Component Value Date   CD4TCELL 32 (L) 05/06/2023   Lab Results  Component Value Date   CD4TABS 451 05/06/2023   CD4TABS 337 (L) 05/17/2022   CD4TABS 420 02/15/2022   Lab Results  Component Value Date   HIV1RNAQUANT Not Detected 05/06/2023   No results found for: "HEPBSAB" Lab Results  Component Value Date   LABRPR NON-REACTIVE 05/06/2023    CBC Lab Results  Component Value Date   WBC 5.0 05/06/2023   RBC 4.43 05/06/2023   HGB 14.3 05/06/2023   HCT 41.3 05/06/2023   PLT 211 05/06/2023   MCV 93.2 05/06/2023   MCH 32.3 05/06/2023   MCHC 34.6 05/06/2023   RDW 12.6 05/06/2023   LYMPHSABS 1,085 02/27/2023   MONOABS 384 02/12/2017   EOSABS 60 05/06/2023    BMET Lab Results  Component Value Date   NA 140 05/06/2023   K 3.7 05/06/2023   CL 105 05/06/2023   CO2 26 05/06/2023   GLUCOSE 96 05/06/2023   BUN 11 05/06/2023   CREATININE 0.56 05/06/2023   CALCIUM 10.0 05/06/2023   GFRNONAA >60  07/26/2020   GFRAA 163 02/22/2020      Assessment and Plan  Hiv disease = continue on triumeq. Will give refills. Will check labs BF counseling = that she plans to stop in 4 more months, continue with excellent adherence See FP  for follow up Long term medication management = will check cr

## 2023-07-09 LAB — T-HELPER CELL (CD4) - (RCID CLINIC ONLY)
CD4 % Helper T Cell: 35 % (ref 33–65)
CD4 T Cell Abs: 470 /uL (ref 400–1790)

## 2023-07-10 LAB — HIV-1 RNA QUANT-NO REFLEX-BLD
HIV 1 RNA Quant: NOT DETECTED {copies}/mL
HIV-1 RNA Quant, Log: NOT DETECTED {Log}

## 2023-08-08 NOTE — Progress Notes (Signed)
 Serai Eyayu Wernli  [id number]  06/04/1991 32 y.o.   Chief Complaint:  Chief Complaint  Patient presents with  . Lumbar Spine - Pain  . Right Leg - Pain     History of Present Illness:  08/08/2023 #[id number] 33 year old female currently breast feeding who presents today with low back pauni. She denies any radicular like symptoms into the lower extremities.  Denies spinal physical therapy in the last year. Denies weakness, gait imbalance or falls. Denies any ESI's in the spine. Denies any spinal surgeries. Denies any saddle paresthesia and loss of bowel or bladder control.     Past Medical History:  Diagnosis Date  . 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 History of toxoplasmosis    positive Toxo IgG  . HIV infection (CMD) 09/01/2015   followed by ID  . Lead exposure 12/16/2015   12/16/15 - referred to [facility] for eval  . Shingles 08/2015    No past surgical history on file.     Social Drivers of Health with Concerns   Living Situation: Not on file  Utilities: Not on file  Alcohol Screening: Not on file  Depression: Not on file     Review of Systems:  Reviewed patient questionnaire including history of present illness, prior treatment, past medical history, family/social history, and review of systems     Current Outpatient Medications:  .  atazanavir  (Reyataz ) 300 mg capsule, Take 300 mg by mouth daily with breakfast., Disp: 30 capsule, Rfl: 1 .  Biktarvy  50-200-25 mg tab per tablet, TK 1 T PO D, Disp: , Rfl: 5 .  cyclobenzaprine (FLEXERIL) 10 mg tablet, Take 10 mg by mouth 2 (two) times a day as needed for muscle spasms., Disp: 20 tablet, Rfl: 0 .  emtricitabine -tenofovir , TDF, (Truvada) 200-300 mg per tablet, Take 1 tablet by mouth Once Daily., Disp: 30 tablet, Rfl: 1 .  ibuprofen  (MOTRIN ) 800 mg tablet, Take 800 mg by mouth every 6 (six) hours as needed., Disp: 28 tablet, Rfl: 0 .  mvn-min75-iron-iron ps-om3-dha (Concept DHA) 35-1-200 mg cap, Take 1  capsule by mouth Once Daily., Disp: 90 capsule, Rfl: 3 .  ritonavir  (Norvir ) 100 mg tablet, Take 100 mg by mouth daily with breakfast., Disp: 30 tablet, Rfl: 1    Physical Exam: Vitals Vitals:    CONSTITUTIONAL: Appears well developed, no acute distress  HEAD: Normocephalic atraumatic  EYES: EOMI.  NECK: Supple, no tracheal deviation  CHEST/RESPIRATORY: Symmetric expansion of the chest, no intercostal retraction, no respiratory distress  CARDIOVASCULAR: Pulses palpable at the wrist, capillary refill <2secs, no edema  NEUROLOGIC EXAM: Alert oriented x 3 with normal coordination  PSYCHIATRIC: Normal mood, affect, thought content, & judgement for  age  SKIN: No erythema, no discoloration, no open draining wounds, no rash   MUSCULOSKELETAL EXAM SPINE EXAMINATION Patient is grossly neurologically intact in bilateral lower extremities.     Radiographic Studies: XR imaging from [facility] not available today for independent interpretation.   EXAM:  LUMBAR SPINE - COMPLETE 4+ VIEW   COMPARISON:  None Available.   FINDINGS:  There is no evidence of lumbar spine fracture. Alignment is normal.  Intervertebral disc spaces are maintained.   IMPRESSION:  Negative.    I have independently reviewed the patient following labs, allergies and medications: Labs No results found for: EGFR  No results found for: HGBA1C  Lab Results  Component Value Date   WBC 6.0 04/25/2022   HGB 12.1 (L) 04/25/2022   HCT  33.2 (L) 04/25/2022   PLT 133 (L) 04/25/2022   Lab Results  Component Value Date   NA 138 06/02/2021   K 3.5 06/02/2021   CL 108 06/02/2021   CO2 24 06/02/2021   BUN 8 06/02/2021   CREATININE 0.61 06/02/2021   CALCIUM 9.5 06/02/2021   MG 2.1 06/02/2021   Lab Results  Component Value Date   BILITOT 0.8 06/02/2021   BILIDIR 0.1 06/02/2021   PROT 7.3 06/02/2021   ALBUMIN 4.4 06/02/2021   ALT 21 06/02/2021   AST 11 06/02/2021   No results found for: LABPROT, INR,  APTT No results found for: LDLDIRECT, CHOL, HDL, TRIG  Allergies Patient has no known allergies.  Assessment:  Relda was seen today for pain and pain.  Diagnoses and all orders for this visit:  Chronic bilateral low back pain without sciatica -     Ambulatory referral to Physical Therapy; Future     During this visit we had a conversation regarding his diagnosis, etiology, natural history, and treatment options, both nonsurgical and surgical.    Plan:  Continue to take tylenol  as needed for low back pain.  Avoid NSAIDs while breast feeding.  Referral for PT for the lumbar spine Follow up as needed     Orders Placed This Encounter  Procedures  . Ambulatory referral to Physical Therapy    Standing Status:   Future    Expiration Date:   09/07/2024    Referral Priority:   Routine    Referral Type:   ANCILLARY REFERRAL    Requested Specialty:   Physical Therapy    Number of Visits Requested:   1      NOTE ~ This chart has been made utilizing [employer]. Review for errors has been completed but may not always be caught. Please excuse any errors that have been missed.     Electronically signed by:  Vernell Calton Bohr, NP, 08/08/2023 10:53 AM

## 2023-08-13 ENCOUNTER — Ambulatory Visit: Payer: Medicaid Other | Attending: Family

## 2023-09-04 ENCOUNTER — Other Ambulatory Visit: Payer: Self-pay | Admitting: Internal Medicine

## 2023-10-07 ENCOUNTER — Ambulatory Visit: Payer: Medicaid Other | Admitting: Internal Medicine

## 2023-10-09 ENCOUNTER — Ambulatory Visit: Payer: Medicaid Other | Admitting: Internal Medicine

## 2023-10-28 ENCOUNTER — Other Ambulatory Visit: Payer: Self-pay

## 2023-10-28 ENCOUNTER — Ambulatory Visit: Admitting: Internal Medicine

## 2023-10-28 VITALS — BP 99/69 | HR 88 | Temp 98.1°F | Resp 16 | Wt 149.6 lb

## 2023-10-28 DIAGNOSIS — Z7189 Other specified counseling: Secondary | ICD-10-CM

## 2023-10-28 DIAGNOSIS — Z79899 Other long term (current) drug therapy: Secondary | ICD-10-CM

## 2023-10-28 DIAGNOSIS — B20 Human immunodeficiency virus [HIV] disease: Secondary | ICD-10-CM | POA: Diagnosis present

## 2023-10-28 NOTE — Progress Notes (Signed)
 Patient ID: Mary Zhang, female   DOB: 09-14-1990, 33 y.o.   MRN: [id number]  HPI CD 4 count of 470/VL<20 (in dec 2025) on triumeq . Her baby is 12-67months old. Not missing doses. She continues to breast feed her daughter roughly 3 times per day. She would like to continue for additional 6 months  Ros: tension headache but she doesn't take any otc.  Outpatient Encounter Medications as of 10/28/2023  Medication Sig   abacavir -dolutegravir -lamiVUDine  (TRIUMEQ ) 600-50-300 MG tablet Take 1 tablet by mouth daily.   Prenatal Vit-Fe Fumarate-FA (PREPLUS) 27-1 MG TABS Take 1 tablet by mouth daily.   cefdinir  (OMNICEF ) 300 MG capsule Take 1 capsule (300 mg total) by mouth 2 (two) times daily. (Patient not taking: Reported on 10/28/2023)   No facility-administered encounter medications on file as of 10/28/2023.     Patient Active Problem List   Diagnosis Date Noted   Cesarean delivery delivered 10/07/2022   Supervision of other high risk pregnancy, antepartum 03/28/2022   HIV infection (HCC) 08/26/2020   Tinea 06/28/2020   History of preterm delivery 01/05/2020   Language barrier 01/22/2019   Benign gestational thrombocytopenia, antepartum (HCC) 01/22/2019   Lead exposure 12/16/2015     Health Maintenance Due  Topic Date Due   Pneumococcal Vaccine 20-25 Years old (3 of 3 - PPSV23 or PCV20) 05/28/2022     Review of Systems Per hpi Physical Exam   Wt 149 lb 9.6 oz (67.9 kg)   BMI 24.15 kg/m   Physical Exam  Constitutional:  oriented to person, place, and time. appears well-developed and well-nourished. No distress.  HENT: [place]/AT, PERRLA, no scleral icterus Mouth/Throat: Oropharynx is clear and moist. No oropharyngeal exudate.  Cardiovascular: Normal rate, regular rhythm and normal heart sounds. Exam reveals no gallop and no friction rub.  No murmur heard.  Pulmonary/Chest: Effort normal and breath sounds normal. No respiratory distress.  has no wheezes.  Neck = supple, no nuchal  rigidity Abdominal: Soft. Bowel sounds are normal.  exhibits no distension. There is no tenderness.  Lymphadenopathy: no cervical adenopathy. No axillary adenopathy Neurological: alert and oriented to person, place, and time.  Skin: Skin is warm and dry. No rash noted. No erythema.  Psychiatric: a normal mood and affect.  behavior is normal.   Lab Results  Component Value Date   CD4TCELL 35 07/08/2023   Lab Results  Component Value Date   CD4TABS 470 07/08/2023   CD4TABS 451 05/06/2023   CD4TABS 337 (L) 05/17/2022   Lab Results  Component Value Date   HIV1RNAQUANT Not Detected 07/08/2023   No results found for: "HEPBSAB" Lab Results  Component Value Date   LABRPR NON-REACTIVE 05/06/2023    CBC Lab Results  Component Value Date   WBC 5.0 05/06/2023   RBC 4.43 05/06/2023   HGB 14.3 05/06/2023   HCT 41.3 05/06/2023   PLT 211 05/06/2023   MCV 93.2 05/06/2023   MCH 32.3 05/06/2023   MCHC 34.6 05/06/2023   RDW 12.6 05/06/2023   LYMPHSABS 1,085 02/27/2023   MONOABS 384 02/12/2017   EOSABS 60 05/06/2023    BMET Lab Results  Component Value Date   NA 140 05/06/2023   K 3.7 05/06/2023   CL 105 05/06/2023   CO2 26 05/06/2023   GLUCOSE 96 05/06/2023   BUN 11 05/06/2023   CREATININE 0.56 05/06/2023   CALCIUM 10.0 05/06/2023   GFRNONAA >60 07/26/2020   GFRAA 163 02/22/2020      Assessment and Plan  Hiv  disease= will check labs to ensure she is well controlled. She hasn't missed doses during her pregnancies nor this time while she is breastfeeding. She understands that if she misses doses she should no longer breast feed as it may risk hiv viremia.  Long term medicaiotn management = will check labs to see cr is stable  Health maintenance = not needing an  vaccines at this time.

## 2023-10-29 LAB — T-HELPER CELL (CD4) - (RCID CLINIC ONLY)
CD4 % Helper T Cell: 35 % (ref 33–65)
CD4 T Cell Abs: 373 /uL — ABNORMAL LOW (ref 400–1790)

## 2023-10-30 LAB — COMPLETE METABOLIC PANEL WITHOUT GFR
AG Ratio: 1.7 (calc) (ref 1.0–2.5)
ALT: 11 U/L (ref 6–29)
AST: 13 U/L (ref 10–30)
Albumin: 4.6 g/dL (ref 3.6–5.1)
Alkaline phosphatase (APISO): 58 U/L (ref 31–125)
BUN/Creatinine Ratio: 19 (calc) (ref 6–22)
BUN: 9 mg/dL (ref 7–25)
CO2: 26 mmol/L (ref 20–32)
Calcium: 9.6 mg/dL (ref 8.6–10.2)
Chloride: 108 mmol/L (ref 98–110)
Creat: 0.47 mg/dL — ABNORMAL LOW (ref 0.50–0.97)
Globulin: 2.7 g/dL (ref 1.9–3.7)
Glucose, Bld: 85 mg/dL (ref 65–99)
Potassium: 3.8 mmol/L (ref 3.5–5.3)
Sodium: 141 mmol/L (ref 135–146)
Total Bilirubin: 0.7 mg/dL (ref 0.2–1.2)
Total Protein: 7.3 g/dL (ref 6.1–8.1)

## 2023-10-30 LAB — MEASLES/MUMPS/RUBELLA IMMUNITY
Mumps IgG: 12.3 [AU]/ml
Rubella: 2.18 {index}
Rubeola IgG: >300 [AU]/ml

## 2023-10-30 LAB — HIV-1 RNA QUANT-NO REFLEX-BLD
HIV 1 RNA Quant: NOT DETECTED {copies}/mL
HIV-1 RNA Quant, Log: NOT DETECTED {Log_copies}/mL

## 2023-10-30 LAB — CBC WITH DIFFERENTIAL/PLATELET
Absolute Lymphocytes: 1321 {cells}/uL (ref 850–3900)
Absolute Monocytes: 291 {cells}/uL (ref 200–950)
Basophils Absolute: 0 {cells}/uL (ref 0–200)
Basophils Relative: 0 %
Eosinophils Absolute: 42 {cells}/uL (ref 15–500)
Eosinophils Relative: 0.8 %
HCT: 39.1 % (ref 35.0–45.0)
Hemoglobin: 13.5 g/dL (ref 11.7–15.5)
MCH: 32.5 pg (ref 27.0–33.0)
MCHC: 34.5 g/dL (ref 32.0–36.0)
MCV: 94.2 fL (ref 80.0–100.0)
MPV: 11.2 fL (ref 7.5–12.5)
Monocytes Relative: 5.6 %
Neutro Abs: 3546 {cells}/uL (ref 1500–7800)
Neutrophils Relative %: 68.2 %
Platelets: 212 10*3/uL (ref 140–400)
RBC: 4.15 10*6/uL (ref 3.80–5.10)
RDW: 12.7 % (ref 11.0–15.0)
Total Lymphocyte: 25.4 %
WBC: 5.2 10*3/uL (ref 3.8–10.8)

## 2023-11-27 ENCOUNTER — Other Ambulatory Visit: Payer: Self-pay | Admitting: Family Medicine

## 2024-03-16 ENCOUNTER — Ambulatory Visit: Admitting: Internal Medicine

## 2024-04-29 ENCOUNTER — Ambulatory Visit: Admitting: Internal Medicine

## 2024-04-30 ENCOUNTER — Encounter: Payer: Self-pay | Admitting: Internal Medicine

## 2024-04-30 ENCOUNTER — Other Ambulatory Visit: Payer: Self-pay

## 2024-04-30 ENCOUNTER — Ambulatory Visit: Admitting: Internal Medicine

## 2024-04-30 VITALS — BP 96/60 | HR 88 | Temp 98.0°F | Resp 18 | Ht 62.0 in | Wt 155.6 lb

## 2024-04-30 DIAGNOSIS — Z23 Encounter for immunization: Secondary | ICD-10-CM | POA: Diagnosis not present

## 2024-04-30 DIAGNOSIS — Z79899 Other long term (current) drug therapy: Secondary | ICD-10-CM

## 2024-04-30 DIAGNOSIS — B20 Human immunodeficiency virus [HIV] disease: Secondary | ICD-10-CM

## 2024-04-30 MED ORDER — TRIUMEQ 600-50-300 MG PO TABS: 1.0000 | ORAL_TABLET | Freq: Every day | ORAL | 11 refills | Status: DC

## 2024-04-30 NOTE — Progress Notes (Signed)
 Patient ID: Mary Zhang, female   DOB: [birth year], 33 y.o.   MRN: [id number]  HPI 33yo F with well controlled hiv disease, CD 4 count 373/VL<20 on triumeq . She is No longer breast feeding. Her daughter is now 1.77yr old No missing doses of triumeq   Has her citizenship ceremony in [place] tomorrow  Outpatient Encounter Medications as of 04/30/2024  Medication Sig   abacavir -dolutegravir -lamiVUDine  (TRIUMEQ ) 600-50-300 MG tablet Take 1 tablet by mouth daily.   Prenatal Vit-Fe Fumarate-FA (WESTAB PLUS) 27-1 MG TABS TAKE 1 TABLET BY MOUTH DAILY   cefdinir  (OMNICEF ) 300 MG capsule Take 1 capsule (300 mg total) by mouth 2 (two) times daily. (Patient not taking: Reported on 04/30/2024)   No facility-administered encounter medications on file as of 04/30/2024.     Patient Active Problem List   Diagnosis Date Noted   Cesarean delivery delivered 10/07/2022   Supervision of other high risk pregnancy, antepartum 03/28/2022   HIV infection (HCC) 08/26/2020   Tinea 06/28/2020   History of preterm delivery 01/05/2020   Language barrier 01/22/2019   Benign gestational thrombocytopenia, antepartum 01/22/2019   Lead exposure 12/16/2015     Health Maintenance Due  Topic Date Due   Hepatitis B Vaccines 19-59 Average Risk (1 of 3 - 19+ 3-dose series) Never done   HPV VACCINES (1 - Risk 3-dose SCDM series) Never done   Pneumococcal Vaccine (3 of 3 - PCV20 or PCV21) 05/28/2022   Influenza Vaccine  02/21/2024   COVID-19 Vaccine (4 - 2025-26 season) 03/23/2024     Review of Systems 12 point ros is negative Physical Exam   Wt 155 lb 9.6 oz (70.6 kg)   LMP 04/09/2024 (Approximate)   Breastfeeding No Comment: recently stopped around 5 days ago  BMI 25.11 kg/m   Physical Exam  Constitutional:  oriented to person, place, and time. appears well-developed and well-nourished. No distress.  HENT: [place]/AT, PERRLA, no scleral icterus Mouth/Throat: Oropharynx is clear and moist. No oropharyngeal  exudate.  Cardiovascular: Normal rate, regular rhythm and normal heart sounds. Exam reveals no gallop and no friction rub.  No murmur heard.  Pulmonary/Chest: Effort normal and breath sounds normal. No respiratory distress.  has no wheezes.  Neck = supple, no nuchal rigidity Abdominal: Soft. Bowel sounds are normal.  exhibits no distension. There is no tenderness.  Lymphadenopathy: no cervical adenopathy. No axillary adenopathy Neurological: alert and oriented to person, place, and time.  Skin: Skin is warm and dry. No rash noted. No erythema.  Psychiatric: a normal mood and affect.  behavior is normal.   Lab Results  Component Value Date   CD4TCELL 35 10/28/2023   Lab Results  Component Value Date   CD4TABS 373 (L) 10/28/2023   CD4TABS 470 07/08/2023   CD4TABS 451 05/06/2023   Lab Results  Component Value Date   HIV1RNAQUANT NOT DETECTED 10/28/2023   No results found for: HEPBSAB Lab Results  Component Value Date   LABRPR NON-REACTIVE 05/06/2023    CBC Lab Results  Component Value Date   WBC 5.2 10/28/2023   RBC 4.15 10/28/2023   HGB 13.5 10/28/2023   HCT 39.1 10/28/2023   PLT 212 10/28/2023   MCV 94.2 10/28/2023   MCH 32.5 10/28/2023   MCHC 34.5 10/28/2023   RDW 12.7 10/28/2023   LYMPHSABS 1,085 02/27/2023   MONOABS 384 02/12/2017   EOSABS 42 10/28/2023    BMET Lab Results  Component Value Date   NA 141 10/28/2023   K 3.8 10/28/2023  CL 108 10/28/2023   CO2 26 10/28/2023   GLUCOSE 85 10/28/2023   BUN 9 10/28/2023   CREATININE 0.47 (L) 10/28/2023   CALCIUM 9.6 10/28/2023   GFRNONAA >60 07/26/2020   GFRAA 163 02/22/2020      Assessment and Plan HIV disease= Will get labs to see still undetectable; will give triumeq  refills  Long term medication = will check cr is stable  Health maintenance = Flu and covid vaccine today  Rtc in 3 mo

## 2024-05-01 ENCOUNTER — Ambulatory Visit: Admitting: Internal Medicine

## 2024-05-01 LAB — T-HELPER CELL (CD4) - (RCID CLINIC ONLY)
CD4 % Helper T Cell: 34 % (ref 33–65)
CD4 T Cell Abs: 495 /uL (ref 400–1790)

## 2024-05-02 LAB — HIV-1 RNA QUANT-NO REFLEX-BLD
HIV 1 RNA Quant: NOT DETECTED {copies}/mL
HIV-1 RNA Quant, Log: NOT DETECTED {Log_copies}/mL

## 2024-05-02 LAB — CBC WITH DIFFERENTIAL/PLATELET
Absolute Lymphocytes: 1408 {cells}/uL (ref 850–3900)
Absolute Monocytes: 390 {cells}/uL (ref 200–950)
Basophils Absolute: 13 {cells}/uL (ref 0–200)
Basophils Relative: 0.2 %
Eosinophils Absolute: 51 {cells}/uL (ref 15–500)
Eosinophils Relative: 0.8 %
HCT: 38.7 % (ref 35.0–45.0)
Hemoglobin: 13.7 g/dL (ref 11.7–15.5)
MCH: 33.8 pg — ABNORMAL HIGH (ref 27.0–33.0)
MCHC: 35.4 g/dL (ref 32.0–36.0)
MCV: 95.6 fL (ref 80.0–100.0)
MPV: 10.9 fL (ref 7.5–12.5)
Monocytes Relative: 6.1 %
Neutro Abs: 4538 {cells}/uL (ref 1500–7800)
Neutrophils Relative %: 70.9 %
Platelets: 214 Thousand/uL (ref 140–400)
RBC: 4.05 Million/uL (ref 3.80–5.10)
RDW: 12.6 % (ref 11.0–15.0)
Total Lymphocyte: 22 %
WBC: 6.4 Thousand/uL (ref 3.8–10.8)

## 2024-05-02 LAB — COMPLETE METABOLIC PANEL WITHOUT GFR
AG Ratio: 1.6 (calc) (ref 1.0–2.5)
ALT: 16 U/L (ref 6–29)
AST: 15 U/L (ref 10–30)
Albumin: 4.3 g/dL (ref 3.6–5.1)
Alkaline phosphatase (APISO): 46 U/L (ref 31–125)
BUN/Creatinine Ratio: 16 (calc) (ref 6–22)
BUN: 8 mg/dL (ref 7–25)
CO2: 29 mmol/L (ref 20–32)
Calcium: 9.5 mg/dL (ref 8.6–10.2)
Chloride: 104 mmol/L (ref 98–110)
Creat: 0.49 mg/dL — ABNORMAL LOW (ref 0.50–0.97)
Globulin: 2.7 g/dL (ref 1.9–3.7)
Glucose, Bld: 83 mg/dL (ref 65–99)
Potassium: 3.8 mmol/L (ref 3.5–5.3)
Sodium: 139 mmol/L (ref 135–146)
Total Bilirubin: 0.7 mg/dL (ref 0.2–1.2)
Total Protein: 7 g/dL (ref 6.1–8.1)

## 2024-05-02 LAB — RPR: RPR Ser Ql: NONREACTIVE

## 2024-06-03 ENCOUNTER — Ambulatory Visit: Payer: Self-pay | Admitting: Family Medicine

## 2024-06-03 ENCOUNTER — Ambulatory Visit: Admitting: Family Medicine

## 2024-06-03 ENCOUNTER — Ambulatory Visit (HOSPITAL_BASED_OUTPATIENT_CLINIC_OR_DEPARTMENT_OTHER): Admission: RE | Admit: 2024-06-03 | Source: Ambulatory Visit

## 2024-06-03 ENCOUNTER — Encounter: Payer: Self-pay | Admitting: Family Medicine

## 2024-06-03 VITALS — BP 100/60 | HR 96 | Temp 98.0°F | Resp 18 | Ht 62.0 in | Wt 156.0 lb

## 2024-06-03 DIAGNOSIS — K1379 Other lesions of oral mucosa: Secondary | ICD-10-CM

## 2024-06-03 DIAGNOSIS — H6062 Unspecified chronic otitis externa, left ear: Secondary | ICD-10-CM | POA: Diagnosis not present

## 2024-06-03 LAB — CBC WITH DIFFERENTIAL/PLATELET
Basophils Absolute: 0 K/uL (ref 0.0–0.1)
Basophils Relative: 0.2 % (ref 0.0–3.0)
Eosinophils Absolute: 0.1 K/uL (ref 0.0–0.7)
Eosinophils Relative: 0.8 % (ref 0.0–5.0)
HCT: 40.3 % (ref 36.0–46.0)
Hemoglobin: 14.1 g/dL (ref 12.0–15.0)
Lymphocytes Relative: 19 % (ref 12.0–46.0)
Lymphs Abs: 1.4 K/uL (ref 0.7–4.0)
MCHC: 35 g/dL (ref 30.0–36.0)
MCV: 93.8 fl (ref 78.0–100.0)
Monocytes Absolute: 0.4 K/uL (ref 0.1–1.0)
Monocytes Relative: 5 % (ref 3.0–12.0)
Neutro Abs: 5.4 K/uL (ref 1.4–7.7)
Neutrophils Relative %: 75 % (ref 43.0–77.0)
Platelets: 209 K/uL (ref 150.0–400.0)
RBC: 4.29 Mil/uL (ref 3.87–5.11)
RDW: 13 % (ref 11.5–15.5)
WBC: 7.2 K/uL (ref 4.0–10.5)

## 2024-06-03 LAB — BASIC METABOLIC PANEL WITH GFR
BUN: 11 mg/dL (ref 6–23)
CO2: 31 meq/L (ref 19–32)
Calcium: 9.2 mg/dL (ref 8.4–10.5)
Chloride: 103 meq/L (ref 96–112)
Creatinine, Ser: 0.51 mg/dL (ref 0.40–1.20)
GFR: 124.14 mL/min (ref >60.00)
Glucose, Bld: 86 mg/dL (ref 70–99)
Potassium: 3.8 meq/L (ref 3.5–5.1)
Sodium: 140 meq/L (ref 135–145)

## 2024-06-03 MED ORDER — AMOXICILLIN-POT CLAVULANATE 875-125 MG PO TABS: 1.0000 | ORAL_TABLET | Freq: Two times a day (BID) | ORAL | 0 refills | Status: DC

## 2024-06-03 NOTE — Progress Notes (Signed)
 Acute Office Visit  Subjective:  Patient ID: Mary Zhang, female    DOB: [birth year]  Age: 33 y.o. MRN: [id number]  CC:  Chief Complaint  Patient presents with   Ear Pain    Left onset:       HPI Mary Zhang is here for Ear Problem.  Discussed the use of AI scribe software for clinical note transcription with the patient, who gave verbal consent to proceed.  History of Present Illness Mary Zhang is a 33 year old female who presents with recurrent left ear discharge.  She has been experiencing intermittent left ear discharge for about two years, with episodes occurring approximately every six months. The most recent episode began in September, leading to a visit to the emergency room where she was told she had an abscess in the left ear canal and was prescribed antibiotics. The antibiotics initially provided relief, with the ear being dry for five days, but the discharge returned. The discharge is described as clear with crust formation overnight. There is no associated pain, but she experiences swelling at the edge of the outer ear skin. No fever, chills, or changes in hearing are reported. She has not consulted an ear specialist and typically seeks care at the emergency room when symptoms arise.  Additionally, she reports a bump on her left cheek that has been present for about two years. The bump sometimes enlarges and becomes painful, especially when sleeping. She has not undergone any imaging for this issue.           Past Medical History:  Diagnosis Date   Gestational thrombocytopenia    HIV disease (HCC) 09/30/2015   HIV infection (HCC) 08/26/2020   Low blood pressure, not hypotension 01/02/2016   Menorrhagia    Raised antibody titer 10/25/2015   High toxoplasma gondi antibody titer   Shingles     Past Surgical History:  Procedure Laterality Date   CESAREAN SECTION N/A 10/07/2022   Procedure: CESAREAN SECTION;  Surgeon: Jayne Vonn DEL, MD;  Location: [facility] LD ORS;   Service: Obstetrics;  Laterality: N/A;   NO PAST SURGERIES      Family History  Problem Relation Age of Onset   Hypertension Neg Hx    Diabetes Neg Hx     Social History   Socioeconomic History   Marital status: Married    Spouse name: Not on file   Number of children: Not on file   Years of education: Not on file   Highest education level: Not on file  Occupational History   Not on file  Tobacco Use   Smoking status: Never   Smokeless tobacco: Never  Vaping Use   Vaping status: Never Used  Substance and Sexual Activity   Alcohol use: No   Drug use: No   Sexual activity: Yes    Partners: Male    Birth control/protection: None  Other Topics Concern   Not on file  Social History Narrative   Not on file   Social Drivers of Health   Financial Resource Strain: Not on file  Food Insecurity: No Food Insecurity (01/08/2019)   Hunger Vital Sign    Worried About Running Out of Food in the Last Year: Never true    Ran Out of Food in the Last Year: Never true  Transportation Needs: No Transportation Needs (01/08/2019)   PRAPARE - Administrator, Civil Service (Medical): No    Lack of Transportation (Non-Medical): No  Physical Activity: Not on file  Stress: Not on file  Social Connections: Not on file  Intimate Partner Violence: Not on file    ROS All ROS negative except what is listed in the HPI.   Objective:   Today's Vitals: BP 100/60   Pulse 96   Temp 98 F (36.7 C)   Resp 18   Ht 5' 2 (1.575 m)   Wt 156 lb (70.8 kg)   LMP 06/02/2024   SpO2 98%   BMI 28.53 kg/m   Physical Exam Vitals reviewed.  Constitutional:      Appearance: Normal appearance.  HENT:     Head:      Comments: 1.5-2 cm firm, nodule; mobile, nontender to palpation, no fluctuance or warmth; no abnormal findings inside the mouth at same location    Ears:     Comments: L canal and external ear with some erythema and inflammation; some dried drainage noted Musculoskeletal:      Cervical back: Normal range of motion and neck supple. No tenderness.  Lymphadenopathy:     Cervical: No cervical adenopathy.  Skin:    General: Skin is warm and dry.  Neurological:     Mental Status: She is alert and oriented to person, place, and time.  Psychiatric:        Mood and Affect: Mood normal.        Behavior: Behavior normal.        Thought Content: Thought content normal.         Erythem/inflammed Left cheeck 1.5-2cm had nodule, mobile, nontender      Assessment & Plan:   Problem List Items Addressed This Visit   None Visit Diagnoses       Nodule of cheek    -  Primary   Relevant Orders   Ambulatory referral to ENT   CBC with Differential/Platelet   Basic metabolic panel with GFR   CT Soft Tissue Neck W Contrast     Chronic otitis externa of left ear, unspecified type       Relevant Medications   amoxicillin-clavulanate (AUGMENTIN) 875-125 MG tablet   Other Relevant Orders   Ambulatory referral to ENT       Assessment & Plan Chronic left otitis externa with recurrent discharge Chronic otitis externa with recurrent discharge for two years. Current episode started three weeks ago. Previous antibiotics provided temporary relief. - Prescribed different Augmentin 10 days. - Advised warm compresses with warm washcloth. - Referred to ENT specialist for evaluation. - Instructed to seek hospital care if severe pain, bloody drainage, or significant swelling occurs.  Chronic painful oral/cheek lesion Chronic oral/cheek lesion present for two years with variable size and pain. - Ordered imaging of cheek lesion. - Referred to specialist for evaluation.     Follow-up: Return for - pending results or sooner if needed.   Waddell FURY Almarie, DNP, FNP-C  I,Emily Lagle,acting as a neurosurgeon for Waddell KATHEE Almarie, NP.,have documented all relevant documentation on the behalf of Waddell KATHEE Almarie, NP.   I, Waddell KATHEE Almarie, NP, have reviewed all documentation for this  visit. The documentation on 06/03/2024 for the exam, diagnosis, procedures, and orders are all accurate and complete.

## 2024-06-04 ENCOUNTER — Ambulatory Visit (HOSPITAL_BASED_OUTPATIENT_CLINIC_OR_DEPARTMENT_OTHER)
Admission: RE | Admit: 2024-06-04 | Discharge: 2024-06-04 | Disposition: A | Discharge status: Home / Self Care | Source: Ambulatory Visit | Attending: Family Medicine | Admitting: Family Medicine

## 2024-06-04 ENCOUNTER — Ambulatory Visit: Payer: Self-pay | Admitting: Family Medicine

## 2024-06-04 ENCOUNTER — Ambulatory Visit (HOSPITAL_BASED_OUTPATIENT_CLINIC_OR_DEPARTMENT_OTHER)

## 2024-06-04 ENCOUNTER — Telehealth: Payer: Self-pay | Admitting: Family Medicine

## 2024-06-04 DIAGNOSIS — K1379 Other lesions of oral mucosa: Secondary | ICD-10-CM

## 2024-06-04 NOTE — Telephone Encounter (Signed)
 Received message stating insurance would not cover the CT without an ultrasound first. Will order ultrasound.

## 2024-06-09 NOTE — Telephone Encounter (Signed)
 Copied from CRM #[id number]. Topic: Clinical - Lab/Test Results >> Jun 09, 2024 12:17 PM Alfonso HERO wrote: Reason for CRM: patient husband calling with patient for results.

## 2024-06-09 NOTE — Telephone Encounter (Signed)
 Spoke w/ Pt's husband, Mary Zhang- informed of results and recommendations. Informed him of ENT appt for 06/22/24, address and telephone number to their office given.

## 2024-06-22 ENCOUNTER — Encounter (INDEPENDENT_AMBULATORY_CARE_PROVIDER_SITE_OTHER): Payer: Self-pay

## 2024-06-22 ENCOUNTER — Ambulatory Visit (INDEPENDENT_AMBULATORY_CARE_PROVIDER_SITE_OTHER)

## 2024-06-22 VITALS — BP 111/73 | HR 77 | Temp 98.1°F | Ht 62.0 in | Wt 158.0 lb

## 2024-06-22 DIAGNOSIS — H6092 Unspecified otitis externa, left ear: Secondary | ICD-10-CM

## 2024-06-22 DIAGNOSIS — R220 Localized swelling, mass and lump, head: Secondary | ICD-10-CM

## 2024-06-22 DIAGNOSIS — H60542 Acute eczematoid otitis externa, left ear: Secondary | ICD-10-CM | POA: Diagnosis not present

## 2024-06-22 NOTE — Progress Notes (Signed)
 Dear Dr. Almarie, Here is my assessment for our mutual patient, Mary Zhang. Thank you for allowing me the opportunity to care for your patient. Please do not hesitate to contact me should you have any other questions. Sincerely, Dr. Penne Croak  Otolaryngology Clinic Note Referring provider: Dr. Almarie HPI:  Discussed the use of AI scribe software for clinical note transcription with the patient, who gave verbal consent to proceed.  History of Present Illness A 33 year old female who presents with recurrent drainage from the cheek.  Cheek drainage - Recurrent drainage from the cheek, described as 'a spill of water ', occurring annually for the past two years - Initially drainage originated from inside the cheek, now appears to be from the outside - Current episode has persisted for approximately three weeks - Size of the affected area varies, sometimes small and sometimes larger - No significant pain associated with the drainage at present  Prior diagnostic evaluation - Ultrasound of the cheek performed previously - CT scan was ordered but not completed  Contributing factors and exposures - No recent swimming activities - Denies pregnancy   Independent Review of Additional Tests or Records:  Reviewed external note from referring PCP, Mary Zhang,describing relevant history incorporated into today's evaluation. I personally reviewed US  report - FINDINGS: Sonographic interrogation of the region of clinical concern demonstrates an ovoid hypoechoic soft tissue mass measuring 1.6 x 0.6 x 1.5 cm. The mass is solid and there is internal vascularity.   IMPRESSION: The palpable abnormality corresponds with a 1.6 cm solid soft tissue mass with internal vascularity. While this likely represents a reactive lymph node, a soft tissue neoplasm would be difficult to exclude entirely.   Lesion should be approachable for ultrasound-guided fine-needle aspiration biopsy if clinically warranted.     PMH/Meds/All/SocHx/FamHx/ROS:   Past Medical History:  Diagnosis Date   Gestational thrombocytopenia    HIV disease (HCC) 09/30/2015   HIV infection (HCC) 08/26/2020   Low blood pressure, not hypotension 01/02/2016   Menorrhagia    Raised antibody titer 10/25/2015   High toxoplasma gondi antibody titer   Shingles      Past Surgical History:  Procedure Laterality Date   CESAREAN SECTION N/A 10/07/2022   Procedure: CESAREAN SECTION;  Surgeon: Jayne Vonn DEL, MD;  Location: [facility] LD ORS;  Service: Obstetrics;  Laterality: N/A;   NO PAST SURGERIES      Family History  Problem Relation Age of Onset   Hypertension Neg Hx    Diabetes Neg Hx      Social Connections: Not on file      Current Outpatient Medications:    abacavir -dolutegravir -lamiVUDine  (TRIUMEQ ) 600-50-300 MG tablet, Take 1 tablet by mouth daily., Disp: 30 tablet, Rfl: 11   amoxicillin -clavulanate (AUGMENTIN ) 875-125 MG tablet, Take 1 tablet by mouth 2 (two) times daily., Disp: 20 tablet, Rfl: 0   Prenatal Vit-Fe Fumarate-FA (WESTAB PLUS) 27-1 MG TABS, TAKE 1 TABLET BY MOUTH DAILY, Disp: 90 tablet, Rfl: 3   Physical Exam:   BP 111/73 (BP Location: Left Arm, Patient Position: Sitting)   Pulse 77   Temp 98.1 F (36.7 C)   Ht 5' 2 (1.575 m)   Wt 158 lb (71.7 kg)   LMP 06/02/2024 (Approximate)   SpO2 98%   Breastfeeding No   BMI 28.90 kg/m   The patient was awake, alert, and appropriate. The external ears were inspected, and otoscopy was performed to evaluate the external auditory canals and tympanic membranes. The nasal cavity and septum were examined for  mucosal changes, obstruction, or discharge. The oral cavity and oropharynx were inspected for mucosal lesions, infection, or tonsillar hypertrophy. The neck was palpated for lymphadenopathy, thyroid  abnormalities, or other masses. Cranial nerve function was grossly intact.  Pertinent Findings: Physical Exam HEENT: External ears with normal appearance. Tympanic  membranes with normal landmarks. Nose with midline septum and normal appearance. Oral cavity normal. Left mid cheek with 3cm palpable mass, soft, mobile, cystic   Seprately Identifiable Procedures:  I personally ordered, reviewed and interpreted the following with the patient today  Procedure: Bilateral ear microscopy using microscope (CPT [phone]) Pre-procedure diagnosis: otitis externa Post-procedure diagnosis: same Indication: see above; given patient's otologic complaints and history, for improved and comprehensive examination of external ear and tympanic membrane, bilateral otologic examination using microscope was performed. Prior to proceeding, verbal consent was obtained after discussion of R/B/A  Procedure: Patient was placed semi-recumbent. Both ear canals were examined using the microscope with findings below. Patient tolerated the procedure well.  Right ear:  No significant lesions pinna. EAC: no significant lesions. Canal is clear. Eczematoid changes. minimal TM: Intact   Left ear:  No significant lesions pinna. EAC: no significant lesions. Canal is clear. Eczematoid changes. minimal TM: Intact    Impression & Plans:  Mary Zhang is a 34 y.o. female  1. Cheek mass   2. Otitis externa of left ear, unspecified chronicity, unspecified type    - Findings and diagnoses discussed in detail with the patient. - Risks, benefits, and alternatives were reviewed. Through shared decision making, the patient elects to proceed with below. Assessment & Plan Left cheek swelling/mass Intermittent swelling for two years, recent drainage. Malignancy unlikely. Awaiting CT scan for further evaluation. - Ordered CT scan of the left cheek. - Consider biopsy if CT scan indicates concern. - Advised to monitor for symptom recurrence. - Scheduled follow-up in two months.  Otitis externa, ezcematoid Left >R - improving, EAC clear bilaterally, normal TM, minimal eczematoid changes - Will start  topical steroid drop if symptoms return or worsen  - Orders placed:  Orders Placed This Encounter  Procedures   CT SOFT TISSUE NECK W CONTRAST   US  FINE NEEDLE ASP 1ST LESION   - Medications prescribed/continued/adjusted: No orders of the defined types were placed in this encounter.  - Education materials provided to the patient. - Follow up: 8 weeks. Patient instructed to return sooner or go to the ED if new/worsening symptoms develop.  Thank you for allowing me the opportunity to care for your patient. Please do not hesitate to contact me should you have any other questions.  Sincerely, Penne Croak, DO Otolaryngologist (ENT) [facility] Phone: [phone] Fax: [phone]  06/22/2024, 2:00 PM

## 2024-06-24 ENCOUNTER — Encounter (HOSPITAL_COMMUNITY): Payer: Self-pay

## 2024-06-24 NOTE — Progress Notes (Signed)
 Karalee Wilkie POUR, MD  Leanette Eutsler Approved for US  guided FNA of LEFT CHEEK MASS  No sedation   HKM       Previous Messages    ----- Message ----- From: Mrk Buzby Sent: 06/23/2024   3:39 PM EST To: Janeva Peaster; Ir Procedure Requests Subject: US  FINE NEEDLE ASP 1ST LESION                  Procedure : US  FINE NEEDLE ASP 1ST LESION  Reason : US  soft tissue head and neck  History : cheek mass Dx: Cheek mass [R22.0 (ICD-10-CM)]    Provider : Anice Riis, DO  Contact : [phone]

## 2024-07-15 ENCOUNTER — Other Ambulatory Visit (INDEPENDENT_AMBULATORY_CARE_PROVIDER_SITE_OTHER): Payer: Self-pay

## 2024-07-15 ENCOUNTER — Ambulatory Visit (HOSPITAL_COMMUNITY)
Admission: RE | Admit: 2024-07-15 | Discharge: 2024-07-15 | Disposition: A | Discharge status: Home / Self Care | Source: Ambulatory Visit

## 2024-07-15 DIAGNOSIS — R220 Localized swelling, mass and lump, head: Secondary | ICD-10-CM | POA: Insufficient documentation

## 2024-07-15 MED ORDER — LIDOCAINE HCL (PF) 1 % IJ SOLN
2.0000 mL | Freq: Once | INTRAMUSCULAR | Status: AC
  Administered 2024-07-15: 2 mL via INTRADERMAL

## 2024-07-15 NOTE — Procedures (Signed)
 Vascular and Interventional Radiology Procedure Note  Patient: Mary Zhang DOB: 1990/08/11 Medical Record Number: [id number] Note Date/Time: 07/15/2024 2:10 PM   Performing Physician: Thom Hall, MD Assistant(s): None  Diagnosis: SQ L cheek mass. No DX   Procedure: SUBCUTANEOUS LEFT CHEEK MASS FNA BIOPSY  Anesthesia: Local Anesthetic Complications: None Estimated Blood Loss: Minimal Specimens: Sent for Cytology  Findings:  Successful Ultrasound-guided FNA biopsy of SQ L cheek mass. A total of 3 samples were obtained. Hemostasis of the tract was achieved using Manual Pressure.   See detailed procedure note with images in PACS. The patient tolerated the procedure well without incident or complication and was returned to Recovery in stable condition.    Thom Hall, MD [facility]   Pager. [phone] Clinic. [phone]

## 2024-07-17 LAB — CYTOLOGY - NON PAP

## 2024-08-03 ENCOUNTER — Other Ambulatory Visit: Payer: Self-pay

## 2024-08-03 ENCOUNTER — Ambulatory Visit: Admitting: Internal Medicine

## 2024-08-03 ENCOUNTER — Encounter: Payer: Self-pay | Admitting: Internal Medicine

## 2024-08-03 VITALS — BP 97/68 | HR 89 | Temp 98.3°F | Ht 62.0 in | Wt 154.0 lb

## 2024-08-03 DIAGNOSIS — Z79899 Other long term (current) drug therapy: Secondary | ICD-10-CM

## 2024-08-03 DIAGNOSIS — B20 Human immunodeficiency virus [HIV] disease: Secondary | ICD-10-CM

## 2024-08-03 DIAGNOSIS — Z0000 Encounter for general adult medical examination without abnormal findings: Secondary | ICD-10-CM

## 2024-08-03 MED ORDER — DOLUTEGRAVIR-LAMIVUDINE 50-300 MG PO TABS: 1.0000 | ORAL_TABLET | Freq: Every day | ORAL | 11 refills | Status: AC

## 2024-08-03 NOTE — Progress Notes (Signed)
 "      Patient ID: Mary Zhang, female   DOB: 1990-09-10, 34 y.o.   MRN: [id number]  HPI 33yo F with HIV disease, on triumeq . Has been taking medications daily without missing doses.Her youngest is now almost 1 year 10 months -she Has stopped breast feeding   Outpatient Encounter Medications as of 08/03/2024  Medication Sig   abacavir -dolutegravir -lamiVUDine  (TRIUMEQ ) 600-50-300 MG tablet Take 1 tablet by mouth daily.   Cholecalciferol (VITAMIN D3) 125 MCG (5000 UT) CHEW Chew by mouth.   amoxicillin -clavulanate (AUGMENTIN ) 875-125 MG tablet Take 1 tablet by mouth 2 (two) times daily. (Patient not taking: Reported on 08/03/2024)   Prenatal Vit-Fe Fumarate-FA (WESTAB PLUS) 27-1 MG TABS TAKE 1 TABLET BY MOUTH DAILY (Patient not taking: Reported on 08/03/2024)   No facility-administered encounter medications on file as of 08/03/2024.     Patient Active Problem List   Diagnosis Date Noted   Cesarean delivery delivered 10/07/2022   Supervision of other high risk pregnancy, antepartum 03/28/2022   HIV infection (HCC) 08/26/2020   Tinea 06/28/2020   History of preterm delivery 01/05/2020   Language barrier 01/22/2019   Benign gestational thrombocytopenia, antepartum 01/22/2019   Lead exposure 12/16/2015   There are no preventive care reminders to display for this patient.   Review of Systems 12 point ros is otherwise negative Physical Exam   BP 97/68   Pulse 89   Temp 98.3 F (36.8 C) (Oral)   Ht 5' 2 (1.575 m)   Wt 154 lb (69.9 kg)   SpO2 99%   BMI 28.17 kg/m   Physical Exam  Constitutional:  oriented to person, place, and time. appears well-developed and well-nourished. No distress.  HENT: [place]/AT, PERRLA, no scleral icterus Mouth/Throat: Oropharynx is clear and moist. No oropharyngeal exudate.  Cardiovascular: Normal rate, regular rhythm and normal heart sounds. Exam reveals no gallop and no friction rub.  No murmur heard.  Pulmonary/Chest: Effort normal and breath sounds normal.  No respiratory distress.  has no wheezes.  Neck = supple, no nuchal rigidity Abdominal: Soft. Bowel sounds are normal.  exhibits no distension. There is no tenderness.  Lymphadenopathy: no cervical adenopathy. No axillary adenopathy Neurological: alert and oriented to person, place, and time.  Skin: Skin is warm and dry. No rash noted. No erythema.  Psychiatric: a normal mood and affect.  behavior is normal.   Lab Results  Component Value Date   CD4TCELL 34 04/30/2024   Lab Results  Component Value Date   CD4TABS 495 04/30/2024   CD4TABS 373 (L) 10/28/2023   CD4TABS 470 07/08/2023   Lab Results  Component Value Date   HIV1RNAQUANT NOT DETECTED 04/30/2024   No results found for: HEPBSAB Lab Results  Component Value Date   LABRPR NON-REACTIVE 04/30/2024    CBC Lab Results  Component Value Date   WBC 7.2 06/03/2024   RBC 4.29 06/03/2024   HGB 14.1 06/03/2024   HCT 40.3 06/03/2024   PLT 209.0 06/03/2024   MCV 93.8 06/03/2024   MCH 33.8 (H) 04/30/2024   MCHC 35.0 06/03/2024   RDW 13.0 06/03/2024   LYMPHSABS 1.4 06/03/2024   MONOABS 0.4 06/03/2024   EOSABS 0.1 06/03/2024    BMET Lab Results  Component Value Date   NA 140 06/03/2024   K 3.8 06/03/2024   CL 103 06/03/2024   CO2 31 06/03/2024   GLUCOSE 86 06/03/2024   BUN 11 06/03/2024   CREATININE 0.51 06/03/2024   CALCIUM 9.2 06/03/2024   GFRNONAA >60 07/26/2020  GFRAA 163 02/22/2020      Assessment and Plan HIV disease = will Plan to change triumeq  to dovato . Will check cd 4 count and hiv VL  Long term medication management = will check cr  Health maintenance = Check vitamin d    Return in 6 wk to see how she is tolerating dovato     "

## 2024-08-06 LAB — COMPLETE METABOLIC PANEL WITHOUT GFR
AG Ratio: 1.6 (calc) (ref 1.0–2.5)
ALT: 12 U/L (ref 6–29)
AST: 12 U/L (ref 10–30)
Albumin: 4.3 g/dL (ref 3.6–5.1)
Alkaline phosphatase (APISO): 51 U/L (ref 31–125)
BUN: 11 mg/dL (ref 7–25)
CO2: 30 mmol/L (ref 20–32)
Calcium: 9.4 mg/dL (ref 8.6–10.2)
Chloride: 105 mmol/L (ref 98–110)
Creat: 0.69 mg/dL (ref 0.50–0.97)
Globulin: 2.7 g/dL (ref 1.9–3.7)
Glucose, Bld: 90 mg/dL (ref 65–99)
Potassium: 3.9 mmol/L (ref 3.5–5.3)
Sodium: 138 mmol/L (ref 135–146)
Total Bilirubin: 0.6 mg/dL (ref 0.2–1.2)
Total Protein: 7 g/dL (ref 6.1–8.1)

## 2024-08-06 LAB — CBC WITH DIFFERENTIAL/PLATELET
Absolute Lymphocytes: 1196 {cells}/uL (ref 850–3900)
Absolute Monocytes: 333 {cells}/uL (ref 200–950)
Basophils Absolute: 10 {cells}/uL (ref 0–200)
Basophils Relative: 0.2 %
Eosinophils Absolute: 42 {cells}/uL (ref 15–500)
Eosinophils Relative: 0.8 %
HCT: 40.2 % (ref 35.9–46.0)
Hemoglobin: 13.8 g/dL (ref 11.7–15.5)
MCH: 32 pg (ref 27.0–33.0)
MCHC: 34.3 g/dL (ref 31.6–35.4)
MCV: 93.3 fL (ref 81.4–101.7)
MPV: 10.9 fL (ref 7.5–12.5)
Monocytes Relative: 6.4 %
Neutro Abs: 3619 {cells}/uL (ref 1500–7800)
Neutrophils Relative %: 69.6 %
Platelets: 209 Thousand/uL (ref 140–400)
RBC: 4.31 Million/uL (ref 3.80–5.10)
RDW: 12.5 % (ref 11.0–15.0)
Total Lymphocyte: 23 %
WBC: 5.2 Thousand/uL (ref 3.8–10.8)

## 2024-08-06 LAB — SYPHILIS: RPR W/REFLEX TO RPR TITER AND TREPONEMAL ANTIBODIES, TRADITIONAL SCREENING AND DIAGNOSIS ALGORITHM: RPR Ser Ql: NONREACTIVE

## 2024-08-06 LAB — VITAMIN D 25 HYDROXY (VIT D DEFICIENCY, FRACTURES): Vit D, 25-Hydroxy: 35 ng/mL (ref 30–100)

## 2024-08-06 LAB — VITAMIN D 1,25 DIHYDROXY
Vitamin D 1, 25 (OH)2 Total: 48 pg/mL (ref 18–72)
Vitamin D2 1, 25 (OH)2: <8 pg/mL
Vitamin D3 1, 25 (OH)2: 48 pg/mL

## 2024-08-06 LAB — HIV-1 RNA QUANT-NO REFLEX-BLD
HIV 1 RNA Quant: NOT DETECTED {copies}/mL
HIV-1 RNA Quant, Log: NOT DETECTED {Log_copies}/mL

## 2024-09-09 ENCOUNTER — Ambulatory Visit (INDEPENDENT_AMBULATORY_CARE_PROVIDER_SITE_OTHER)

## 2024-09-14 ENCOUNTER — Ambulatory Visit: Payer: Self-pay | Admitting: Internal Medicine
